# Patient Record
Sex: Male | Born: 1955 | Race: White | Hispanic: No | Marital: Single | Smoking: Never smoker
Health system: Southern US, Community
[De-identification: ages and names within clinical notes are randomized; demographics above are authoritative.]

## PROBLEM LIST (undated history)

## (undated) DIAGNOSIS — E785 Hyperlipidemia, unspecified: Secondary | ICD-10-CM

## (undated) DIAGNOSIS — B1910 Unspecified viral hepatitis B without hepatic coma: Secondary | ICD-10-CM

## (undated) DIAGNOSIS — G8929 Other chronic pain: Secondary | ICD-10-CM

## (undated) DIAGNOSIS — M1990 Unspecified osteoarthritis, unspecified site: Secondary | ICD-10-CM

## (undated) DIAGNOSIS — G629 Polyneuropathy, unspecified: Secondary | ICD-10-CM

## (undated) DIAGNOSIS — K760 Fatty (change of) liver, not elsewhere classified: Secondary | ICD-10-CM

## (undated) DIAGNOSIS — I10 Essential (primary) hypertension: Secondary | ICD-10-CM

## (undated) DIAGNOSIS — F419 Anxiety disorder, unspecified: Secondary | ICD-10-CM

## (undated) DIAGNOSIS — R42 Dizziness and giddiness: Secondary | ICD-10-CM

## (undated) DIAGNOSIS — R945 Abnormal results of liver function studies: Secondary | ICD-10-CM

## (undated) DIAGNOSIS — M5416 Radiculopathy, lumbar region: Secondary | ICD-10-CM

## (undated) DIAGNOSIS — E119 Type 2 diabetes mellitus without complications: Secondary | ICD-10-CM

## (undated) DIAGNOSIS — M545 Low back pain, unspecified: Secondary | ICD-10-CM

## (undated) DIAGNOSIS — R011 Cardiac murmur, unspecified: Secondary | ICD-10-CM

## (undated) DIAGNOSIS — F410 Panic disorder [episodic paroxysmal anxiety] without agoraphobia: Secondary | ICD-10-CM

## (undated) DIAGNOSIS — F32A Depression, unspecified: Secondary | ICD-10-CM

## (undated) DIAGNOSIS — R7989 Other specified abnormal findings of blood chemistry: Secondary | ICD-10-CM

## (undated) DIAGNOSIS — F329 Major depressive disorder, single episode, unspecified: Secondary | ICD-10-CM

## (undated) HISTORY — DX: Other specified abnormal findings of blood chemistry: R79.89

## (undated) HISTORY — DX: Unspecified viral hepatitis B without hepatic coma: B19.10

## (undated) HISTORY — DX: Type 2 diabetes mellitus without complications: E11.9

## (undated) HISTORY — DX: Hyperlipidemia, unspecified: E78.5

## (undated) HISTORY — DX: Panic disorder (episodic paroxysmal anxiety): F41.0

## (undated) HISTORY — DX: Dizziness and giddiness: R42

## (undated) HISTORY — DX: Cardiac murmur, unspecified: R01.1

## (undated) HISTORY — PX: PROSTATE BIOPSY: SHX241

## (undated) HISTORY — DX: Abnormal results of liver function studies: R94.5

## (undated) HISTORY — PX: COLONOSCOPY: SHX174

---

## 2014-08-16 LAB — LIPID PANEL
Cholesterol: 145 mg/dL (ref 0–200)
HDL: 36 mg/dL (ref 35–70)
LDL Cholesterol: 45 mg/dL
Triglycerides: 320 mg/dL — AB (ref 40–160)

## 2014-08-16 LAB — TSH: TSH: 1.61 u[IU]/mL (ref 0.41–5.90)

## 2014-08-16 LAB — HEPATIC FUNCTION PANEL
ALK PHOS: 81 U/L (ref 25–125)
ALT: 46 U/L — AB (ref 10–40)
AST: 29 U/L (ref 14–40)
BILIRUBIN DIRECT: 0.13 mg/dL (ref 0.01–0.4)
Bilirubin, Total: 0.4 mg/dL

## 2014-08-16 LAB — CBC AND DIFFERENTIAL
NEUTROS ABS: 5 /uL
WBC: 9.3 10*3/mL

## 2014-08-16 LAB — BASIC METABOLIC PANEL
BUN: 11 mg/dL (ref 4–21)
Creatinine: 0.8 mg/dL (ref 0.6–1.3)
GLUCOSE: 168 mg/dL
Sodium: 137 mmol/L (ref 137–147)

## 2014-08-16 LAB — HEMOGLOBIN A1C: Hemoglobin A1C: 6.1

## 2014-08-24 ENCOUNTER — Ambulatory Visit
Admit: 2014-08-24 | Disposition: A | Discharge status: Home / Self Care | Payer: Self-pay | Attending: Nurse Practitioner | Admitting: Nurse Practitioner

## 2014-09-08 ENCOUNTER — Ambulatory Visit: Payer: Self-pay

## 2014-09-08 DIAGNOSIS — R55 Syncope and collapse: Secondary | ICD-10-CM | POA: Insufficient documentation

## 2014-09-21 ENCOUNTER — Ambulatory Visit: Payer: Self-pay | Admitting: Internal Medicine

## 2014-09-22 ENCOUNTER — Ambulatory Visit: Payer: Self-pay

## 2014-10-04 ENCOUNTER — Encounter (INDEPENDENT_AMBULATORY_CARE_PROVIDER_SITE_OTHER): Payer: Self-pay

## 2014-10-18 ENCOUNTER — Other Ambulatory Visit: Payer: Self-pay

## 2014-10-18 ENCOUNTER — Encounter: Payer: Self-pay | Admitting: Emergency Medicine

## 2014-10-18 ENCOUNTER — Emergency Department
Admission: EM | Admit: 2014-10-18 | Discharge: 2014-10-18 | Disposition: A | Discharge status: Home / Self Care | Payer: Self-pay | Attending: Emergency Medicine | Admitting: Emergency Medicine

## 2014-10-18 DIAGNOSIS — R600 Localized edema: Secondary | ICD-10-CM | POA: Insufficient documentation

## 2014-10-18 DIAGNOSIS — Z79899 Other long term (current) drug therapy: Secondary | ICD-10-CM | POA: Insufficient documentation

## 2014-10-18 DIAGNOSIS — R609 Edema, unspecified: Secondary | ICD-10-CM

## 2014-10-18 DIAGNOSIS — I10 Essential (primary) hypertension: Secondary | ICD-10-CM | POA: Insufficient documentation

## 2014-10-18 HISTORY — DX: Major depressive disorder, single episode, unspecified: F32.9

## 2014-10-18 HISTORY — DX: Unspecified osteoarthritis, unspecified site: M19.90

## 2014-10-18 HISTORY — DX: Depression, unspecified: F32.A

## 2014-10-18 HISTORY — DX: Fatty (change of) liver, not elsewhere classified: K76.0

## 2014-10-18 HISTORY — DX: Anxiety disorder, unspecified: F41.9

## 2014-10-18 HISTORY — DX: Essential (primary) hypertension: I10

## 2014-10-18 LAB — BASIC METABOLIC PANEL
Anion gap: 8 (ref 5–15)
BUN: 14 mg/dL (ref 6–20)
CO2: 25 mmol/L (ref 22–32)
CREATININE: 0.69 mg/dL (ref 0.61–1.24)
Calcium: 9.1 mg/dL (ref 8.9–10.3)
Chloride: 105 mmol/L (ref 101–111)
GFR calc Af Amer: >60 mL/min (ref >60)
GFR calc non Af Amer: >60 mL/min (ref >60)
Glucose, Bld: 125 mg/dL — ABNORMAL HIGH (ref 65–99)
POTASSIUM: 3.7 mmol/L (ref 3.5–5.1)
SODIUM: 138 mmol/L (ref 135–145)

## 2014-10-18 LAB — CBC
HCT: 42.3 % (ref 40.0–52.0)
HEMOGLOBIN: 14.6 g/dL (ref 13.0–18.0)
MCH: 33.5 pg (ref 26.0–34.0)
MCHC: 34.5 g/dL (ref 32.0–36.0)
MCV: 97 fL (ref 80.0–100.0)
Platelets: 284 10*3/uL (ref 150–440)
RBC: 4.36 MIL/uL — ABNORMAL LOW (ref 4.40–5.90)
RDW: 12.3 % (ref 11.5–14.5)
WBC: 8.1 10*3/uL (ref 3.8–10.6)

## 2014-10-18 NOTE — ED Provider Notes (Signed)
[facility] Emergency Department Provider Note  ____________________________________________  Time seen: Approximately 12:10 PM  I have reviewed the triage vital signs and the nursing notes.   HISTORY  Chief Complaint Leg Swelling    HPI Kyle Hood is a 59 y.o. male with a history of hypertension, depression and anxiety, and peripheral edema for about 4-6 months, who presents with worsening swelling in his legs recently.He was prescribed 10 mg of furosemide daily as well as a potassium supplement for the last several months and also takes amlodipine, which she was told by his primary care doctor may cause some of the leg swelling.  It is not acutely or significantly worse recently, rather he feels that it has not gotten any better or has gotten a little bit worse over time.  He spends most of the day on his feet and has not been elevating his legs.  He has never tried compression stockings.  He has not been on any long trips recently, has no neoplastic history, no recent trauma or surgeries, and no prior history of blood clots.  The patient denies fever/chills, chest pain, shortness of breath, abdominal pain, nausea, vomiting.  He reports that he has to urinate several times during the night.  His swelling is worse at the end of the day than at the beginning.  Nothing that he has tried seems to make it better and being upright and on his feet makes it worse.  The swelling is accompanied with some pain and discomfort which he rates as mild to moderate.   Past Medical History  Diagnosis Date  . Arthritis   . Hypertension   . Depression   . Anxiety   . Fatty liver     There are no active problems to display for this patient.   Past Surgical History  Procedure Laterality Date  . Prostate biopsy      Current Outpatient Rx  Name  Route  Sig  Dispense  Refill  . amLODipine (NORVASC) 10 MG tablet   Oral   Take 10 mg by mouth daily.         . citalopram  (CELEXA) 10 MG tablet   Oral   Take 10 mg by mouth daily.         . furosemide (LASIX) 20 MG tablet   Oral   Take 10 mg by mouth daily.         . hydrOXYzine (VISTARIL) 50 MG capsule   Oral   Take 50 mg by mouth at bedtime as needed.         . potassium chloride (K-DUR) 10 MEQ tablet   Oral   Take 10 mEq by mouth daily.         . simvastatin (ZOCOR) 10 MG tablet   Oral   Take 10 mg by mouth daily.           Allergies Review of patient's allergies indicates no known allergies.  No family history on file.  Social History History  Substance Use Topics  . Smoking status: Never Smoker   . Smokeless tobacco: Not on file  . Alcohol Use: No    Review of Systems Constitutional: No fever/chills Eyes: No visual changes. ENT: No sore throat. Cardiovascular: Denies chest pain. Respiratory: Denies shortness of breath. Gastrointestinal: No abdominal pain.  No nausea, no vomiting.  No diarrhea.  No constipation. Genitourinary: Negative for dysuria. Musculoskeletal: Negative for back pain.  Swelling in lower extremities for several months Skin: Negative  for rash. Neurological: Negative for headaches, focal weakness or numbness.  10-point ROS otherwise negative.  ____________________________________________   PHYSICAL EXAM:  VITAL SIGNS: ED Triage Vitals  Enc Vitals Group     BP 10/18/14 1147 163/87 mmHg     Pulse Rate 10/18/14 1147 89     Resp 10/18/14 1147 20     Temp 10/18/14 1147 97.7 F (36.5 C)     Temp Source 10/18/14 1147 Oral     SpO2 10/18/14 1147 96 %     Weight 10/18/14 1147 256 lb (116.121 kg)     Height 10/18/14 1147  (1.88 m)     Head Cir --      Peak Flow --      Pain Score 10/18/14 1149 8     Pain Loc --      Pain Edu? --      Excl. in GC? --     Constitutional: Alert and oriented. Well appearing and in no acute distress. Eyes: Conjunctivae are normal. PERRL. EOMI. Head: Atraumatic. Nose: No  congestion/rhinnorhea. Mouth/Throat: Mucous membranes are moist.  Oropharynx non-erythematous. Neck: No stridor.   Cardiovascular: Normal rate, regular rhythm. Grossly normal heart sounds.  Good peripheral circulation. Respiratory: Normal respiratory effort.  No retractions. Lungs CTAB. Gastrointestinal: Soft and nontender. No distention. No abdominal bruits. No CVA tenderness. Musculoskeletal: Bilateral 1+ swelling/edema of lower extremities.  Some skin darkening on the anterior shins consistent with chronic edema.  No tenderness to palpation.  More swelling in left foot than the right, but both are roughly consistent.  Extremities are warm with normal cap refill.  No joint effusions. Neurologic:  Normal speech and language. No gross focal neurologic deficits are appreciated. Speech is normal. Skin:  Skin is warm, dry and intact. No rash noted. Psychiatric: Mood and affect are normal. Speech and behavior are normal.  ____________________________________________   LABS (all labs ordered are listed, but only abnormal results are displayed)  Labs Reviewed  CBC - Abnormal; Notable for the following:    RBC 4.36 (*)    All other components within normal limits  BASIC METABOLIC PANEL - Abnormal; Notable for the following:    Glucose, Bld 125 (*)    All other components within normal limits   ____________________________________________  EKG  ED ECG REPORT I, Yazan Gatling, the attending physician, personally viewed and interpreted this ECG.  Date: 10/18/2014 EKG Time: 12:04 Rate: 75 Rhythm: normal sinus rhythm QRS Axis: normal Intervals: normal ST/T Wave abnormalities: normal Conduction Disutrbances: none Narrative Interpretation: unremarkable  ____________________________________________  RADIOLOGY  Not indicated  ____________________________________________   PROCEDURES  Procedure(s) performed: None  Critical Care performed:  No ____________________________________________   INITIAL IMPRESSION / ASSESSMENT AND PLAN / ED COURSE  Pertinent labs & imaging results that were available during my care of the patient were reviewed by me and considered in my medical decision making (see chart for details).  The patient has chronic peripheral edema, which is evident not only by history but by the skin changes that are starting to develop due to the chronic nature of the issue.His well score for DVT is -1.  He has no signs or symptoms of infection to suggest that he has a bilateral lower extremity cellulitis.  I discussed his edema extensively with the patient and his partner.  His providers have suggested in the past that he use compression stockings and try elevating his extremities, but he has not done so yet.  I stressed the importance of these  measures as well as trying to maintain a low-sodium diet.  I explained that I felt it would be potentially more dangerous for me to change his medication regimen then for his primary care doctor to do so.  I am not concerned that the patient has an emergent medical condition at this time, and he and his partner are comfortable with our discussion, plan of care, and plan for follow-up.  I gave him my usual and customary return precautions.  ____________________________________________  FINAL CLINICAL IMPRESSION(S) / ED DIAGNOSES  Final diagnoses:  Peripheral edema      NEW MEDICATIONS STARTED DURING THIS VISIT:  Discharge Medication List as of 10/18/2014  1:09 PM       Loleta Rose, MD 10/18/14 2215

## 2014-10-18 NOTE — ED Notes (Signed)
Pt has been having increasing swelling in bilateral legs/feet for about 6 months now, [facility] about the swelling. Redness and warmpth noted to both shins as well, pt states his legs feel like they are on fire.

## 2014-10-18 NOTE — ED Notes (Signed)
Patient states he has had swelling since February. Swelling had worsened over last week. Left foot was very swollen and red and hot to touch. Some improvement today.

## 2014-10-18 NOTE — Discharge Instructions (Signed)
As we discussed, your labs today were reassuring, and we believe that conservative measures such as compression stockings and elevating your legs whenever possible, will be safer for you than changing your medications.  Please try using both of these methods to reduce the swelling and make sure that you are eating a diet is low in sodium is possible.  Please read through the included information for other advice and follow-up [facility] in about one week for a follow-up visit.  If he develop new or worsening symptoms that concern you, please return to the emergency department.   Peripheral Edema You have swelling in your legs (peripheral edema). This swelling is due to excess accumulation of salt and water in your body. Edema may be a sign of heart, kidney or liver disease, or a side effect of a medication. It may also be due to problems in the leg veins. Elevating your legs and using special support stockings may be very helpful, if the cause of the swelling is due to poor venous circulation. Avoid long periods of standing, whatever the cause. Treatment of edema depends on identifying the cause. Chips, pretzels, pickles and other salty foods should be avoided. Restricting salt in your diet is almost always needed. Water pills (diuretics) are often used to remove the excess salt and water from your body via urine. These medicines prevent the kidney from reabsorbing sodium. This increases urine flow. Diuretic treatment may also result in lowering of potassium levels in your body. Potassium supplements may be needed if you have to use diuretics daily. Daily weights can help you keep track of your progress in clearing your edema. You should call your caregiver for follow up care as recommended. SEEK IMMEDIATE MEDICAL CARE IF:   You have increased swelling, pain, redness, or heat in your legs.  You develop shortness of breath, especially when lying down.  You develop chest or abdominal  pain, weakness, or fainting.  You have a fever. Document Released: 05/23/2004 Document Revised: 07/08/2011 Document Reviewed: 05/03/2009 [facility] Patient Information 2015 [place], [place]. This information is not intended to replace advice given to you by your health care provider. Make sure you discuss any questions you have with your health care provider.

## 2014-10-26 ENCOUNTER — Ambulatory Visit: Payer: Self-pay | Admitting: Internal Medicine

## 2014-10-26 DIAGNOSIS — E785 Hyperlipidemia, unspecified: Secondary | ICD-10-CM | POA: Insufficient documentation

## 2014-10-26 DIAGNOSIS — R600 Localized edema: Secondary | ICD-10-CM | POA: Insufficient documentation

## 2014-10-26 DIAGNOSIS — I10 Essential (primary) hypertension: Secondary | ICD-10-CM | POA: Insufficient documentation

## 2014-11-23 ENCOUNTER — Other Ambulatory Visit: Payer: Self-pay

## 2014-11-30 ENCOUNTER — Ambulatory Visit: Payer: Self-pay | Admitting: Internal Medicine

## 2014-12-21 ENCOUNTER — Ambulatory Visit: Payer: Self-pay | Admitting: Internal Medicine

## 2015-01-11 ENCOUNTER — Ambulatory Visit: Payer: Self-pay | Admitting: Internal Medicine

## 2015-01-16 ENCOUNTER — Other Ambulatory Visit: Payer: Self-pay | Admitting: Internal Medicine

## 2015-01-16 DIAGNOSIS — R600 Localized edema: Secondary | ICD-10-CM

## 2015-01-16 DIAGNOSIS — M7989 Other specified soft tissue disorders: Secondary | ICD-10-CM

## 2015-01-20 ENCOUNTER — Ambulatory Visit
Admission: RE | Admit: 2015-01-20 | Discharge: 2015-01-20 | Disposition: A | Discharge status: Home / Self Care | Payer: Self-pay | Source: Ambulatory Visit | Attending: Internal Medicine | Admitting: Internal Medicine

## 2015-01-20 DIAGNOSIS — R600 Localized edema: Secondary | ICD-10-CM | POA: Insufficient documentation

## 2015-01-20 DIAGNOSIS — M7989 Other specified soft tissue disorders: Secondary | ICD-10-CM

## 2015-05-17 ENCOUNTER — Encounter (INDEPENDENT_AMBULATORY_CARE_PROVIDER_SITE_OTHER): Payer: Self-pay

## 2015-11-01 DIAGNOSIS — R600 Localized edema: Secondary | ICD-10-CM

## 2015-11-01 DIAGNOSIS — R55 Syncope and collapse: Secondary | ICD-10-CM

## 2015-11-01 DIAGNOSIS — I10 Essential (primary) hypertension: Secondary | ICD-10-CM

## 2015-11-01 DIAGNOSIS — E785 Hyperlipidemia, unspecified: Secondary | ICD-10-CM

## 2015-12-30 ENCOUNTER — Emergency Department: Payer: Medicaid Other

## 2015-12-30 ENCOUNTER — Encounter: Payer: Self-pay | Admitting: Emergency Medicine

## 2015-12-30 ENCOUNTER — Emergency Department
Admission: EM | Admit: 2015-12-30 | Discharge: 2015-12-30 | Disposition: A | Discharge status: Home / Self Care | Payer: Medicaid Other | Attending: Emergency Medicine | Admitting: Emergency Medicine

## 2015-12-30 DIAGNOSIS — I10 Essential (primary) hypertension: Secondary | ICD-10-CM | POA: Diagnosis not present

## 2015-12-30 DIAGNOSIS — R791 Abnormal coagulation profile: Secondary | ICD-10-CM | POA: Insufficient documentation

## 2015-12-30 DIAGNOSIS — R202 Paresthesia of skin: Secondary | ICD-10-CM | POA: Diagnosis present

## 2015-12-30 DIAGNOSIS — R200 Anesthesia of skin: Secondary | ICD-10-CM

## 2015-12-30 LAB — COMPREHENSIVE METABOLIC PANEL
ALBUMIN: 4.5 g/dL (ref 3.5–5.0)
ALK PHOS: 79 U/L (ref 38–126)
ALT: 36 U/L (ref 17–63)
AST: 28 U/L (ref 15–41)
Anion gap: 4 — ABNORMAL LOW (ref 5–15)
BILIRUBIN TOTAL: 0.8 mg/dL (ref 0.3–1.2)
BUN: 15 mg/dL (ref 6–20)
CALCIUM: 9.4 mg/dL (ref 8.9–10.3)
CO2: 27 mmol/L (ref 22–32)
CREATININE: 0.67 mg/dL (ref 0.61–1.24)
Chloride: 106 mmol/L (ref 101–111)
GFR calc Af Amer: >60 mL/min (ref >60)
GFR calc non Af Amer: >60 mL/min (ref >60)
GLUCOSE: 97 mg/dL (ref 65–99)
Potassium: 3.8 mmol/L (ref 3.5–5.1)
Sodium: 137 mmol/L (ref 135–145)
TOTAL PROTEIN: 7.4 g/dL (ref 6.5–8.1)

## 2015-12-30 LAB — PROTIME-INR
INR: 0.96
PROTHROMBIN TIME: 12.8 s (ref 11.4–15.2)

## 2015-12-30 LAB — TROPONIN I: Troponin I: <0.03 ng/mL (ref <0.03)

## 2015-12-30 LAB — CBC WITH DIFFERENTIAL/PLATELET
BASOS PCT: 0 %
Basophils Absolute: 0 10*3/uL (ref 0–0.1)
EOS ABS: 0.2 10*3/uL (ref 0–0.7)
EOS PCT: 2 %
HCT: 44.9 % (ref 40.0–52.0)
HEMOGLOBIN: 15.8 g/dL (ref 13.0–18.0)
LYMPHS ABS: 4.4 10*3/uL — AB (ref 1.0–3.6)
Lymphocytes Relative: 41 %
MCH: 33.9 pg (ref 26.0–34.0)
MCHC: 35.2 g/dL (ref 32.0–36.0)
MCV: 96.3 fL (ref 80.0–100.0)
MONO ABS: 0.8 10*3/uL (ref 0.2–1.0)
MONOS PCT: 7 %
NEUTROS PCT: 50 %
Neutro Abs: 5.3 10*3/uL (ref 1.4–6.5)
PLATELETS: 278 10*3/uL (ref 150–440)
RBC: 4.66 MIL/uL (ref 4.40–5.90)
RDW: 12.7 % (ref 11.5–14.5)
WBC: 10.7 10*3/uL — ABNORMAL HIGH (ref 3.8–10.6)

## 2015-12-30 LAB — LACTIC ACID, PLASMA: Lactic Acid, Venous: 1.5 mmol/L (ref 0.5–1.9)

## 2015-12-30 MED ORDER — GADOBENATE DIMEGLUMINE 529 MG/ML IV SOLN
20.0000 mL | Freq: Once | INTRAVENOUS | Status: AC | PRN
  Administered 2015-12-30: 20 mL via INTRAVENOUS

## 2015-12-30 MED ORDER — LORAZEPAM 1 MG PO TABS
1.0000 mg | ORAL_TABLET | Freq: Once | ORAL | Status: AC
  Administered 2015-12-30: 1 mg via ORAL
  Filled 2015-12-30: qty 1

## 2015-12-30 MED ORDER — ASPIRIN 81 MG PO CHEW
81.0000 mg | CHEWABLE_TABLET | Freq: Once | ORAL | Status: AC
  Administered 2015-12-30: 81 mg via ORAL
  Filled 2015-12-30: qty 1

## 2015-12-30 NOTE — ED Notes (Signed)
Pt taken to MRI at this time

## 2015-12-30 NOTE — ED Notes (Signed)
SOC on screen with patient.

## 2015-12-30 NOTE — Discharge Instructions (Signed)
From Dr. Shaune PollackLord: You were evaluated for atypical headache and facial numbness, although no certain cause was found, and your exam and evaluation are reassuring in the emergency department today.  As we discussed, you do need to follow up with a primary care physician this week, and if you're unable to get your appointment moved up, you may call the [facility] for an appointment this week. I'm also including the referral number for neurology, although this could be set up by the primary care physician as well.  Return to the emergency department for any worsening condition including any new or worsening numbness of other areas, weakness, slurred speech, vision changes, confusion or altered mental status, seizure, dizziness or passing out, or any other symptoms concerning to you.

## 2015-12-30 NOTE — ED Provider Notes (Signed)
[facility] Emergency Department Provider Note ____________________________________________   I have reviewed the triage vital signs and the triage nursing note.  HISTORY  Chief Complaint Numbness   Historian Patient  HPI Kyle Hood is a 60 y.o. male With a history of anxiety, depression and panic attacks, who has not recently been followed by primary care physician, but has an appointment for a new doctor at the end of the month, is here due to mild headache with whooshing sensation in his left ear, and numbness to the right side of his face. It sounds like symptoms have been ongoing now for probably about 4 days. He thinks it's waxing and waning, but never truly gone. This morning while he was doing the dishes he was feeling a little stressed and became lightheaded and dizzy and heard whooshing in his left ear and felt like the right side of his face was more numb than typical. For a few seconds yesterday when he was brushing his teeth he felt like he was having trouble moving his mouth normally.  Denies weakness of arms or the legs. Denies coordination problems. He does report that he has had a lot of stress. He doesn't typically have migraine headaches.    Past Medical History:  Diagnosis Date  . Anxiety   . Arthritis   . Depression   . Fatty liver   . Hepatitis B   . Hypertension   . Panic attacks     Patient Active Problem List   Diagnosis Date Noted  . Hypertension 10/26/2014  . Edema 10/26/2014  . Hyperlipidemia 10/26/2014  . Transient loss of consciousness 09/08/2014    Past Surgical History:  Procedure Laterality Date  . PROSTATE BIOPSY      Prior to Admission medications   Medication Sig Start Date End Date Taking? Authorizing Provider  buPROPion (WELLBUTRIN XL) 150 MG 24 hr tablet Take 150 mg by mouth every morning.   Yes Historical Provider, MD    Allergies  Allergen Reactions  . Lisinopril Cough    Family History  Problem  Relation Age of Onset  . Cancer Mother     Pancreatic  . Heart disease Father     CABG  . Cancer Father     Jaw    Social History Social History  Substance Use Topics  . Smoking status: Never Smoker  . Smokeless tobacco: Never Used  . Alcohol use Yes     Comment: Occasional wine     Review of Systems  Constitutional: Negative for fever. Eyes: Negative for visual changes. ENT: Negative for sore throat. Cardiovascular: Negative for chest pain. Respiratory: Negative for shortness of breath. Gastrointestinal: Negative for abdominal pain, vomiting and diarrhea. Genitourinary: Negative for dysuria. Musculoskeletal: Negative for back pain. Skin: Negative for rash. Neurological: he does not call it a headache, but more of a discomfort in the left side of the head. 10 point Review of Systems otherwise negative ____________________________________________   PHYSICAL EXAM:  VITAL SIGNS: ED Triage Vitals  Enc Vitals Group     BP 12/30/15 1154 (!) 171/91     Pulse Rate 12/30/15 1154 74     Resp 12/30/15 1154 (!) 29     Temp 12/30/15 1259 97.7 F (36.5 C)     Temp src --      SpO2 12/30/15 1154 100 %     Weight 12/30/15 1133 250 lb (113.4 kg)     Height 12/30/15 1133 5\' 11"  (1.803 m)  Head Circumference --      Peak Flow --      Pain Score 12/30/15 1133 8     Pain Loc --      Pain Edu? --      Excl. in GC? --      Constitutional: Alert and oriented. Well appearing and in no distress, butsomewhat anxious. HEENT   Head: Normocephalic and atraumatic.      Eyes: Conjunctivae are normal. PERRL. Normal extraocular movements.      Ears:         Nose: No congestion/rhinnorhea.   Mouth/Throat: Mucous membranes are moist.   Neck: No stridor. Cardiovascular/Chest: Normal rate, regular rhythm.  No murmurs, rubs, or gallops. Respiratory: Normal respiratory effort without tachypnea nor retractions. Breath sounds are clear and equal bilaterally. No  wheezes/rales/rhonchi. Gastrointestinal: Soft. No distention, no guarding, no rebound. Nontender.    Genitourinary/rectal:Deferred Musculoskeletal: Nontender with normal range of motion in all extremities. No joint effusions.  No lower extremity tenderness.  No edema. Neurologic:  Normal speech and language. no slurred speech. No expressive or receptive aphasia. Mild generalized weakness, but equal 4/5 strength proximal and distal muscles of the bilateral upper and lower x-rays. Paresthesia right face in V1, V2 and V3 distribution. Normal sensation in the arms and legs bilaterally. Finger-nose intact bilaterally.No pronator drift. Skin:  Skin is warm, dry and intact. No rash noted. Psychiatric: Mood and affect are normal. Somewhat anxious.Speech and behavior are normal. Patient exhibits appropriate insight and judgment.  ____________________________________________   EKG I, Governor Rooks, MD, the attending physician have personally viewed and interpreted all ECGs.  78 bpm. Normal sinus rhythm. Normal axis. Normal ST and T-wave. ____________________________________________  LABS (pertinent positives/negatives)  Labs Reviewed  COMPREHENSIVE METABOLIC PANEL - Abnormal; Notable for the following:       Result Value   Anion gap 4 (*)    All other components within normal limits  CBC WITH DIFFERENTIAL/PLATELET - Abnormal; Notable for the following:    WBC 10.7 (*)    Lymphs Abs 4.4 (*)    All other components within normal limits  TROPONIN I  PROTIME-INR  LACTIC ACID, PLASMA    ____________________________________________  RADIOLOGY All Xrays were viewed by me. Imaging interpreted by Radiologist.  CT head without contrast: No evidence for acute intracranial abnormality.  MRI brain, MA head and neck: Pending __________________________________________  PROCEDURES  Procedure(s) performed: None  Critical Care performed: CRITICAL CARE Performed by: Governor Rooks   Total  critical care time: 30 minutes  Critical care time was exclusive of separately billable procedures and treating other patients.  Critical care was necessary to treat or prevent imminent or life-threatening deterioration.  Critical care was time spent personally by me on the following activities: development of treatment plan with patient and/or surrogate as well as nursing, discussions with consultants, evaluation of patient's response to treatment, examination of patient, obtaining history from patient or surrogate, ordering and performing treatments and interventions, ordering and review of laboratory studies, ordering and review of radiographic studies, pulse oximetry and re-evaluation of patient's condition.   ____________________________________________   ED COURSE / ASSESSMENT AND PLAN  Pertinent labs & imaging results that were available during my care of the patient were reviewed by me and considered in my medical decision making (see chart for details).   Patient arrived and initiated as a code stroke by triage nurse. CT head read as negative.  On my examination, patient has paresthesias to the right side of his face and  no other focal neurologic deficit.  It sounds like time of onset is probably 4 days ago, although there is some waxing and waning component.  Symptoms are fairly atypical for stroke, however given the neurologic complaint, tele-neurology was consulted and evaluated the patient. My impression is consistent with the neurologist evaluation, feels TIA and stroke less likely then anxiety, or headache syndrome.  Patient's not really complaining of significant headache now and does not really request any medication for that. He is requesting anxiety medication prior to the MRI.  Patient care transferred to Dr. York CeriseForbach at shift change, 3:30 PM. MRI and MRA are pending.If no emergency findings, patient may be discharged with my prepared discharge  instructions.    CONSULTATIONS:  Tele-neurology, recommends MRI and if negative for stroke or other emergency condition, may discharge home for outpatient follow-up.   Patient / Family / Caregiver informed of clinical course, medical decision-making process, and agree with plan.   I discussed return precautions, follow-up instructions, and discharged instructions with patient and/or family.   ___________________________________________   FINAL CLINICAL IMPRESSION(S) / ED DIAGNOSES   Final diagnoses:  Right facial numbness              Note: This dictation was prepared with Dragon dictation. Any transcriptional errors that result from this process are unintentional    Governor Rooksebecca Eloyse Causey, MD 12/30/15 1521

## 2015-12-30 NOTE — ED Provider Notes (Signed)
----------------------------------------- 4:09 PM on 12/30/2015 -----------------------------------------   Blood pressure (!) 146/72, pulse 73, temperature 97.7 F (36.5 C), resp. rate 17, height 5\' 11"  (1.803 m), weight 113.4 kg, SpO2 97 %.  Assuming care from Dr. Shaune Pollack.  In short, Kyle Hood is a 60 y.o. male with a chief complaint of Numbness .  Refer to the original H&P for additional details.  The current plan of care is to follow-up MR imaging and reassess.  Anticipate outpatient follow-up.   ----------------------------------------- 6:32 PM on 12/30/2015 -----------------------------------------  Ct Head Wo Contrast  Result Date: 12/30/2015 CLINICAL DATA:  Code stroke. Dr. Fanny Bien @ [phone]. Pt reports after eating breakfast at 9 am he started to feel numbness in right side of face and headache on right side. States he then had trouble concentrating. EXAM: CT HEAD WITHOUT CONTRAST TECHNIQUE: Contiguous axial images were obtained from the base of the skull through the vertex without intravenous contrast. COMPARISON:  None. FINDINGS: Brain: There is no intra or extra-axial fluid collection or mass lesion. The basilar cisterns and ventricles have a normal appearance. There is no CT evidence for acute infarction or hemorrhage. Vascular: Mild atherosclerotic calcification of the internal carotid arteries. Skull: Unremarkable. Sinuses/Orbits: Unremarkable. Other: None IMPRESSION: No evidence for acute intracranial abnormality. Electronically Signed   By: Norva Pavlov M.D.   On: 12/30/2015 11:53   Mr Maxine Glenn Head Wo Contrast  Result Date: 12/30/2015 CLINICAL DATA:  Headache with whooshing sensation in the left ear and right-sided facial numbness. Symptoms for 4 days, waxing and waning. EXAM: MRI HEAD WITHOUT CONTRAST MRA HEAD WITHOUT CONTRAST MRA NECK WITHOUT AND WITH CONTRAST TECHNIQUE: Multiplanar, multiecho pulse sequences of the brain and surrounding structures were obtained without  intravenous contrast. Angiographic images of the Circle of Willis were obtained using MRA technique without intravenous contrast. Angiographic images of the neck were obtained using MRA technique without and with intravenous contrast. Carotid stenosis measurements (when applicable) are obtained utilizing NASCET criteria, using the distal internal carotid diameter as the denominator. CONTRAST:  20mL MULTIHANCE GADOBENATE DIMEGLUMINE 529 MG/ML IV SOLN COMPARISON:  Head CT 12/30/2015 FINDINGS: MRI HEAD FINDINGS There is no evidence of acute infarct, intracranial hemorrhage, mass, midline shift, or extra-axial fluid collection. The ventricles and sulci are normal. The brain is normal in signal. Orbits are unremarkable. There is trace right maxillary sinus mucosal thickening. The mastoid air cells are clear. Limited assessment of the seventh and eighth cranial nerve complexes on this nondedicated study is unremarkable. Major intracranial vascular flow voids are preserved, with the left vertebral artery being dominant. No focal osseous lesion is identified. MRA HEAD FINDINGS The visualized distal left vertebral artery is widely patent and dominant, supplying the basilar. The distal right vertebral artery is hypoplastic. Right PICA appears patent, although its origin was not imaged. Left PICA origin is patent. SCA origins are patent. Basilar artery is widely patent and mildly tortuous. There is likely a tiny left posterior communicating artery. PCAs are patent without evidence of significant proximal stenosis or major branch occlusion. The internal carotid arteries are patent from skullbase to carotid termini and follow a normal course without stenosis. ACAs and MCAs are patent without evidence of major branch occlusion or significant proximal stenosis. No intracranial aneurysm is identified. MRA NECK FINDINGS Normal variant aortic arch branching pattern with average right subclavian artery. Both subclavian arteries are  patent without significant stenosis. The cervical carotid arteries are widely patent without evidence of stenosis or dissection. The vertebral arteries are patent with antegrade flow  bilaterally. The left vertebral artery is dominant and without stenosis. The proximal aspect of the right vertebral artery is not well evaluated due to motion. The remainder of the right vertebral artery is patent without stenosis. IMPRESSION: 1. Unremarkable appearance of the brain. 2. Negative head MRA. 3. Widely patent cervical carotid and left vertebral arteries. Proximal right vertebral artery not well evaluated. Electronically Signed   By: Sebastian AcheAllen  Grady M.D.   On: 12/30/2015 17:53   Mr Angiogram Neck W Wo Contrast  Result Date: 12/30/2015 CLINICAL DATA:  Headache with whooshing sensation in the left ear and right-sided facial numbness. Symptoms for 4 days, waxing and waning. EXAM: MRI HEAD WITHOUT CONTRAST MRA HEAD WITHOUT CONTRAST MRA NECK WITHOUT AND WITH CONTRAST TECHNIQUE: Multiplanar, multiecho pulse sequences of the brain and surrounding structures were obtained without intravenous contrast. Angiographic images of the Circle of Willis were obtained using MRA technique without intravenous contrast. Angiographic images of the neck were obtained using MRA technique without and with intravenous contrast. Carotid stenosis measurements (when applicable) are obtained utilizing NASCET criteria, using the distal internal carotid diameter as the denominator. CONTRAST:  20mL MULTIHANCE GADOBENATE DIMEGLUMINE 529 MG/ML IV SOLN COMPARISON:  Head CT 12/30/2015 FINDINGS: MRI HEAD FINDINGS There is no evidence of acute infarct, intracranial hemorrhage, mass, midline shift, or extra-axial fluid collection. The ventricles and sulci are normal. The brain is normal in signal. Orbits are unremarkable. There is trace right maxillary sinus mucosal thickening. The mastoid air cells are clear. Limited assessment of the seventh and eighth cranial  nerve complexes on this nondedicated study is unremarkable. Major intracranial vascular flow voids are preserved, with the left vertebral artery being dominant. No focal osseous lesion is identified. MRA HEAD FINDINGS The visualized distal left vertebral artery is widely patent and dominant, supplying the basilar. The distal right vertebral artery is hypoplastic. Right PICA appears patent, although its origin was not imaged. Left PICA origin is patent. SCA origins are patent. Basilar artery is widely patent and mildly tortuous. There is likely a tiny left posterior communicating artery. PCAs are patent without evidence of significant proximal stenosis or major branch occlusion. The internal carotid arteries are patent from skullbase to carotid termini and follow a normal course without stenosis. ACAs and MCAs are patent without evidence of major branch occlusion or significant proximal stenosis. No intracranial aneurysm is identified. MRA NECK FINDINGS Normal variant aortic arch branching pattern with average right subclavian artery. Both subclavian arteries are patent without significant stenosis. The cervical carotid arteries are widely patent without evidence of stenosis or dissection. The vertebral arteries are patent with antegrade flow bilaterally. The left vertebral artery is dominant and without stenosis. The proximal aspect of the right vertebral artery is not well evaluated due to motion. The remainder of the right vertebral artery is patent without stenosis. IMPRESSION: 1. Unremarkable appearance of the brain. 2. Negative head MRA. 3. Widely patent cervical carotid and left vertebral arteries. Proximal right vertebral artery not well evaluated. Electronically Signed   By: Sebastian AcheAllen  Grady M.D.   On: 12/30/2015 17:53   Mr Brain Wo Contrast  Result Date: 12/30/2015 CLINICAL DATA:  Headache with whooshing sensation in the left ear and right-sided facial numbness. Symptoms for 4 days, waxing and waning. EXAM:  MRI HEAD WITHOUT CONTRAST MRA HEAD WITHOUT CONTRAST MRA NECK WITHOUT AND WITH CONTRAST TECHNIQUE: Multiplanar, multiecho pulse sequences of the brain and surrounding structures were obtained without intravenous contrast. Angiographic images of the Circle of Willis were obtained using MRA technique without  intravenous contrast. Angiographic images of the neck were obtained using MRA technique without and with intravenous contrast. Carotid stenosis measurements (when applicable) are obtained utilizing NASCET criteria, using the distal internal carotid diameter as the denominator. CONTRAST:  20mL MULTIHANCE GADOBENATE DIMEGLUMINE 529 MG/ML IV SOLN COMPARISON:  Head CT 12/30/2015 FINDINGS: MRI HEAD FINDINGS There is no evidence of acute infarct, intracranial hemorrhage, mass, midline shift, or extra-axial fluid collection. The ventricles and sulci are normal. The brain is normal in signal. Orbits are unremarkable. There is trace right maxillary sinus mucosal thickening. The mastoid air cells are clear. Limited assessment of the seventh and eighth cranial nerve complexes on this nondedicated study is unremarkable. Major intracranial vascular flow voids are preserved, with the left vertebral artery being dominant. No focal osseous lesion is identified. MRA HEAD FINDINGS The visualized distal left vertebral artery is widely patent and dominant, supplying the basilar. The distal right vertebral artery is hypoplastic. Right PICA appears patent, although its origin was not imaged. Left PICA origin is patent. SCA origins are patent. Basilar artery is widely patent and mildly tortuous. There is likely a tiny left posterior communicating artery. PCAs are patent without evidence of significant proximal stenosis or major branch occlusion. The internal carotid arteries are patent from skullbase to carotid termini and follow a normal course without stenosis. ACAs and MCAs are patent without evidence of major branch occlusion or  significant proximal stenosis. No intracranial aneurysm is identified. MRA NECK FINDINGS Normal variant aortic arch branching pattern with average right subclavian artery. Both subclavian arteries are patent without significant stenosis. The cervical carotid arteries are widely patent without evidence of stenosis or dissection. The vertebral arteries are patent with antegrade flow bilaterally. The left vertebral artery is dominant and without stenosis. The proximal aspect of the right vertebral artery is not well evaluated due to motion. The remainder of the right vertebral artery is patent without stenosis. IMPRESSION: 1. Unremarkable appearance of the brain. 2. Negative head MRA. 3. Widely patent cervical carotid and left vertebral arteries. Proximal right vertebral artery not well evaluated. Electronically Signed   By: Sebastian Ache M.D.   On: 12/30/2015 17:53     Reassuring MRI.  Patient in no acute distress with no new or worsening symptoms at this time.  I advised him and his partner of the results and they are both reassured.  I will discharge them as per Dr. Merlene Pulling discharge instructions.   Loleta Rose, MD 12/30/15 [phone]

## 2015-12-30 NOTE — ED Notes (Signed)
NAD noted at this time. Pt resting in bed with his friend at bedside. Will continue to monitor for further patient needs. Explained to patient that MD had written orders for medications prior to MRI and per MRI tech he would be going at approx 1545. Pt states understanding of delay, will continue to monitor.

## 2015-12-30 NOTE — ED Notes (Signed)
NAD noted at time of D/C. Pt denies questions or concerns. Pt taken to the lobby via wheelchair at this time by his significant other.

## 2015-12-30 NOTE — ED Triage Notes (Signed)
Pt reports after eating breakfast at 9 am he started to feel numbness in right side of face and headache on right side.  States he then had trouble concentrating.  Grips are equal, no facial droop noted, no slurred speech.

## 2015-12-30 NOTE — ED Notes (Signed)
Pt returned from MRI at this time

## 2015-12-30 NOTE — ED Notes (Signed)
NAD noted at this time. Spoke with patient regarding MRI, pt states some nervousness about being in MRI. This RN told patient that I would speak with MD regarding possible medications prior to going to MRI. Pt states understanding about the wait to go to MRI, and the need for the procedure. Pt's friend remains at bedside. Denies any needs. Will continue to monitor for further patient needs.

## 2016-03-25 ENCOUNTER — Encounter (INDEPENDENT_AMBULATORY_CARE_PROVIDER_SITE_OTHER): Payer: Self-pay

## 2016-03-25 ENCOUNTER — Ambulatory Visit (INDEPENDENT_AMBULATORY_CARE_PROVIDER_SITE_OTHER): Payer: Medicaid Other | Admitting: Vascular Surgery

## 2016-03-25 ENCOUNTER — Encounter (INDEPENDENT_AMBULATORY_CARE_PROVIDER_SITE_OTHER): Payer: Self-pay | Admitting: Vascular Surgery

## 2016-03-25 VITALS — BP 166/88 | HR 82 | Resp 16 | Ht 72.0 in | Wt 260.0 lb

## 2016-03-25 DIAGNOSIS — E785 Hyperlipidemia, unspecified: Secondary | ICD-10-CM

## 2016-03-25 DIAGNOSIS — R600 Localized edema: Secondary | ICD-10-CM | POA: Diagnosis not present

## 2016-03-25 DIAGNOSIS — I10 Essential (primary) hypertension: Secondary | ICD-10-CM

## 2016-03-25 DIAGNOSIS — I739 Peripheral vascular disease, unspecified: Secondary | ICD-10-CM | POA: Insufficient documentation

## 2016-03-25 NOTE — Progress Notes (Signed)
Subjective:    Patient ID: Kyle Hood, male    DOB: [birth year], 60 y.o.   MRN: [id number] Chief Complaint  Patient presents with  . New Patient (Initial Visit)   Presents as a new patient referred by Dr. Ephraim Hamburger for "left lower extremity edema". The patient endorses history of lower extremity "swelling" left worse than right. "Swelling" started approximately two year ago. He denies any trauma or surgery to his bilateral lower extremities. States his left extremity was so swollen at one point, he could not wear regular shoes. He was recently started on HCTZ and prescribed compression stockings by his PCP. Patient states this has helped "dramatically" however he still does suffer from swelling in both his extremities. The swelling is associated with discomfort. Describes the discomfort as a "throbbing" or "burning" feeling. He also describes some intermittent mild pain in his bilateral calves when walking long distances. He denies a history of any wound or ulcer formation.     Review of Systems  Constitutional: Negative.   HENT: Negative.   Eyes: Negative.   Respiratory: Negative.   Cardiovascular: Positive for leg swelling (Bilaterally).       Bilateral Lower Extremity Pain  Gastrointestinal: Negative.   Endocrine: Negative.   Genitourinary: Negative.   Musculoskeletal: Negative.   Skin: Negative.  Color change: Bilateral Shins.  Allergic/Immunologic: Negative.   Neurological: Negative.   Hematological: Negative.   Psychiatric/Behavioral: Negative.        Objective:   Physical Exam  Constitutional: He is oriented to person, place, and time. He appears well-developed and well-nourished.  HENT:  Head: Normocephalic and atraumatic.  Right Ear: External ear normal.  Left Ear: External ear normal.  Eyes: Conjunctivae and EOM are normal. Pupils are equal, round, and reactive to light.  Neck: Normal range of motion.  Cardiovascular: Normal rate, regular rhythm, normal heart  sounds and intact distal pulses.   Pulses:      Radial pulses are 2+ on the right side, and 2+ on the left side.       Dorsalis pedis pulses are 1+ on the right side, and 1+ on the left side.       Posterior tibial pulses are 1+ on the right side, and 1+ on the left side.  Pulmonary/Chest: Effort normal and breath sounds normal.  Abdominal: Soft. Bowel sounds are normal.  Musculoskeletal: Normal range of motion. He exhibits edema (Right: Mild, 1+ Pitting Edema / Left: Moderate, 1+ Pitting Edema).  Neurological: He is alert and oriented to person, place, and time.  Skin: Skin is warm and dry. No rash noted. No erythema. No pallor.  Mild bilateral stasis dermatitis  Psychiatric: He has a normal mood and affect. His behavior is normal. Judgment and thought content normal.   BP (!) 166/88   Pulse 82   Resp 16   Ht 6' (1.829 m)   Wt 260 lb (117.9 kg)   BMI 35.26 kg/m   Past Medical History:  Diagnosis Date  . Anxiety   . Arthritis   . Depression   . Fatty liver   . Hepatitis B   . Hypertension   . Panic attacks    Social History   Social History  . Marital status: Single    Spouse name: N/A  . Number of children: N/A  . Years of education: N/A   Occupational History  . Not on file.   Social History Main Topics  . Smoking status: Never Smoker  . Smokeless tobacco: Never Used  .  Alcohol use Yes     Comment: Occasional wine   . Drug use: No  . Sexual activity: Not on file   Other Topics Concern  . Not on file   Social History Narrative  . No narrative on file   Past Surgical History:  Procedure Laterality Date  . PROSTATE BIOPSY     Family History  Problem Relation Age of Onset  . Cancer Mother     Pancreatic  . Diabetes Mother   . Hypertension Mother   . Varicose Veins Mother   . Heart disease Father     CABG  . Cancer Father     Jaw  . Aortic aneurysm Father   . Congestive Heart Failure Father   . Hypertension Father   . Varicose Veins Father     Allergies  Allergen Reactions  . Lisinopril Cough      Assessment & Plan:  Presents as a new patient referred by Dr. Ephraim Hamburger for "left lower extremity edema". The patient endorses history of lower extremity "swelling" left worse than right. "Swelling" started approximately two year ago. He denies any trauma or surgery to his bilateral lower extremities. States his left extremity was so swollen at one point, he could not wear regular shoes. He was recently started on HCTZ and prescribed compression stockings by his PCP. Patient states this has helped "dramatically" however he still does suffer from swelling in both his extremities. The swelling is associated with discomfort. Describes the discomfort as a "throbbing" or "burning" feeling. He also describes some intermittent mild pain in his bilateral calves when walking long distances. He denies a history of any wound or ulcer formation.   1. Claudication of both lower extremities (HCC) - New Patient with risk factors for PAD (HTN & Hyperlipidemia). Mild claudication pain.  - VAS [place] ABI WITH/WO TBI; Future  2. Lower extremity edema - New The patient was encouraged to continue wearing graduated compression stockings (20-30 mmHg) on a daily basis. The patient was instructed to begin wearing the stockings first thing in the morning and removing them in the evening. The patient was instructed specifically not to sleep in the stockings. In addition, behavioral modification including elevation during the day will be initiated. Anti-inflammatories for pain. - VAS [place] LOWER EXTREMITY VENOUS REFLUX; Future  3. Hyperlipidemia, unspecified hyperlipidemia type - Stable Encouraged good control as its slows the progression of atherosclerotic disease  4. Essential hypertension - Stable Encouraged good control as its slows the progression of atherosclerotic disease  Current Outpatient Prescriptions on File Prior to Visit  Medication Sig Dispense Refill  .  buPROPion (WELLBUTRIN XL) 150 MG 24 hr tablet Take 150 mg by mouth every morning.     No current facility-administered medications on file prior to visit.     There are no Patient Instructions on file for this visit. No Follow-up on file.   Javonnie Illescas A Olof Marcil, PA-C

## 2016-06-14 ENCOUNTER — Encounter: Payer: Self-pay | Admitting: Emergency Medicine

## 2016-06-14 ENCOUNTER — Emergency Department
Admission: EM | Admit: 2016-06-14 | Discharge: 2016-06-14 | Disposition: A | Discharge status: Home / Self Care | Payer: Medicaid Other | Attending: Emergency Medicine | Admitting: Emergency Medicine

## 2016-06-14 ENCOUNTER — Emergency Department: Payer: Medicaid Other

## 2016-06-14 DIAGNOSIS — M79605 Pain in left leg: Secondary | ICD-10-CM | POA: Diagnosis present

## 2016-06-14 DIAGNOSIS — L03116 Cellulitis of left lower limb: Secondary | ICD-10-CM | POA: Diagnosis not present

## 2016-06-14 DIAGNOSIS — I10 Essential (primary) hypertension: Secondary | ICD-10-CM | POA: Insufficient documentation

## 2016-06-14 LAB — BLOOD CULTURE ID PANEL (REFLEXED)
ACINETOBACTER BAUMANNII: NOT DETECTED
CANDIDA ALBICANS: NOT DETECTED
CANDIDA GLABRATA: NOT DETECTED
Candida krusei: NOT DETECTED
Candida parapsilosis: NOT DETECTED
Candida tropicalis: NOT DETECTED
ENTEROBACTER CLOACAE COMPLEX: NOT DETECTED
ENTEROBACTERIACEAE SPECIES: NOT DETECTED
ENTEROCOCCUS SPECIES: NOT DETECTED
Escherichia coli: NOT DETECTED
HAEMOPHILUS INFLUENZAE: NOT DETECTED
Klebsiella oxytoca: NOT DETECTED
Klebsiella pneumoniae: NOT DETECTED
Listeria monocytogenes: NOT DETECTED
NEISSERIA MENINGITIDIS: NOT DETECTED
PSEUDOMONAS AERUGINOSA: NOT DETECTED
Proteus species: NOT DETECTED
STREPTOCOCCUS AGALACTIAE: DETECTED — AB
STREPTOCOCCUS PNEUMONIAE: NOT DETECTED
STREPTOCOCCUS SPECIES: DETECTED — AB
Serratia marcescens: NOT DETECTED
Staphylococcus aureus (BCID): NOT DETECTED
Staphylococcus species: NOT DETECTED
Streptococcus pyogenes: NOT DETECTED

## 2016-06-14 LAB — CBC
HEMATOCRIT: 45.6 % (ref 40.0–52.0)
Hemoglobin: 15.5 g/dL (ref 13.0–18.0)
MCH: 32.6 pg (ref 26.0–34.0)
MCHC: 34 g/dL (ref 32.0–36.0)
MCV: 96 fL (ref 80.0–100.0)
PLATELETS: 285 10*3/uL (ref 150–440)
RBC: 4.76 MIL/uL (ref 4.40–5.90)
RDW: 12.6 % (ref 11.5–14.5)
WBC: 18.9 10*3/uL — AB (ref 3.8–10.6)

## 2016-06-14 LAB — COMPREHENSIVE METABOLIC PANEL
ALBUMIN: 4.6 g/dL (ref 3.5–5.0)
ALT: 33 U/L (ref 17–63)
ANION GAP: 11 (ref 5–15)
AST: 37 U/L (ref 15–41)
Alkaline Phosphatase: 77 U/L (ref 38–126)
BILIRUBIN TOTAL: 0.8 mg/dL (ref 0.3–1.2)
BUN: 15 mg/dL (ref 6–20)
CHLORIDE: 100 mmol/L — AB (ref 101–111)
CO2: 26 mmol/L (ref 22–32)
Calcium: 9.2 mg/dL (ref 8.9–10.3)
Creatinine, Ser: 1.04 mg/dL (ref 0.61–1.24)
GFR calc Af Amer: >60 mL/min (ref >60)
GFR calc non Af Amer: >60 mL/min (ref >60)
GLUCOSE: 140 mg/dL — AB (ref 65–99)
POTASSIUM: 3.8 mmol/L (ref 3.5–5.1)
SODIUM: 137 mmol/L (ref 135–145)
Total Protein: 7.8 g/dL (ref 6.5–8.1)

## 2016-06-14 LAB — INFLUENZA PANEL BY PCR (TYPE A & B)
INFLAPCR: NEGATIVE
Influenza B By PCR: NEGATIVE

## 2016-06-14 MED ORDER — CLINDAMYCIN PHOSPHATE 900 MG/50ML IV SOLN
900.0000 mg | Freq: Once | INTRAVENOUS | Status: AC
  Administered 2016-06-14: 900 mg via INTRAVENOUS
  Filled 2016-06-14: qty 50

## 2016-06-14 MED ORDER — MORPHINE SULFATE (PF) 2 MG/ML IV SOLN
2.0000 mg | Freq: Once | INTRAVENOUS | Status: AC
  Administered 2016-06-14: 2 mg via INTRAVENOUS
  Filled 2016-06-14: qty 1

## 2016-06-14 MED ORDER — ONDANSETRON HCL 4 MG/2ML IJ SOLN
4.0000 mg | Freq: Once | INTRAMUSCULAR | Status: AC
  Administered 2016-06-14: 4 mg via INTRAVENOUS
  Filled 2016-06-14: qty 2

## 2016-06-14 MED ORDER — OXYCODONE-ACETAMINOPHEN 5-325 MG PO TABS: 1.0000 | ORAL_TABLET | ORAL | 0 refills | Status: DC | PRN

## 2016-06-14 MED ORDER — SULFAMETHOXAZOLE-TRIMETHOPRIM 800-160 MG PO TABS: 1.0000 | ORAL_TABLET | Freq: Two times a day (BID) | ORAL | 0 refills | Status: DC

## 2016-06-14 MED ORDER — SODIUM CHLORIDE 0.9 % IV BOLUS (SEPSIS)
1000.0000 mL | Freq: Once | INTRAVENOUS | Status: AC
  Administered 2016-06-14: 1000 mL via INTRAVENOUS

## 2016-06-14 NOTE — ED Notes (Signed)
Patient transported to Ultrasound 

## 2016-06-14 NOTE — ED Notes (Signed)
Patient c/o redness/pain/warmth to lower left leg. Pt c/o pain to left groin.

## 2016-06-14 NOTE — ED Notes (Signed)
MD Brown at bedside.

## 2016-06-14 NOTE — ED Provider Notes (Signed)
[facility] Emergency Department Provider Note    First MD Initiated Contact with Patient 06/14/16 [phone]     (approximate)  I have reviewed the triage vital signs and the nursing notes.   HISTORY  Chief Complaint Cellulitis   HPI Kyle Hood is a 61 y.o. male with bolus of chronic medical conditions presents the emergency department with left leg pain, redness and warmth. Patient states that he was treated for cellulitis in November however never completely resolved. Patient also admits to generalized pain. Patient exhibits a subjective fevers at home temperature 99.4 on arrival to the emergency department. Patient however noted to be tachycardic with a heart rate of 123    Past Medical History:  Diagnosis Date  . Anxiety   . Arthritis   . Depression   . Fatty liver   . Hepatitis B   . Hypertension   . Panic attacks     Patient Active Problem List   Diagnosis Date Noted  . Claudication of both lower extremities (HCC) 03/25/2016  . Hypertension 10/26/2014  . Lower extremity edema 10/26/2014  . Hyperlipidemia 10/26/2014  . Transient loss of consciousness 09/08/2014    Past Surgical History:  Procedure Laterality Date  . PROSTATE BIOPSY      Prior to Admission medications   Medication Sig Start Date End Date Taking? Authorizing Provider  buPROPion (WELLBUTRIN XL) 150 MG 24 hr tablet Take 150 mg by mouth every morning.    Historical Provider, MD  hydrochlorothiazide (HYDRODIURIL) 25 MG tablet Take 25 mg by mouth daily.    Historical Provider, MD  oxyCODONE-acetaminophen (ROXICET) 5-325 MG tablet Take 1 tablet by mouth every 4 (four) hours as needed for severe pain. 06/14/16   Charlynne Pander, MD  sulfamethoxazole-trimethoprim (BACTRIM DS,SEPTRA DS) 800-160 MG tablet Take 1 tablet by mouth 2 (two) times daily. 06/14/16 06/24/16  Darci Current, MD    Allergies Lisinopril  Family History  Problem Relation Age of Onset  . Cancer Mother       Pancreatic  . Diabetes Mother   . Hypertension Mother   . Varicose Veins Mother   . Heart disease Father     CABG  . Cancer Father     Jaw  . Aortic aneurysm Father   . Congestive Heart Failure Father   . Hypertension Father   . Varicose Veins Father     Social History Social History  Substance Use Topics  . Smoking status: Never Smoker  . Smokeless tobacco: Never Used  . Alcohol use Yes     Comment: Occasional wine     Review of Systems Constitutional: Positive for fever/chills Eyes: No visual changes. ENT: No sore throat. Cardiovascular: Denies chest pain. Respiratory: Denies shortness of breath. Gastrointestinal: No abdominal pain.  No nausea, no vomiting.  No diarrhea.  No constipation. Genitourinary: Negative for dysuria. Musculoskeletal: Negative for back pain. Skin: Positive for left leg redness and tenderness Neurological: Negative for headaches, focal weakness or numbness.  10-point ROS otherwise negative.  ____________________________________________   PHYSICAL EXAM:  VITAL SIGNS: ED Triage Vitals  Enc Vitals Group     BP 06/14/16 0448 (!) 177/78     Pulse Rate 06/14/16 0448 (!) 123     Resp 06/14/16 0448 20     Temp 06/14/16 0448 99.4 F (37.4 C)     Temp Source 06/14/16 0448 Oral     SpO2 06/14/16 0448 97 %     Weight 06/14/16 0448 246 lb (111.6 kg)  Height 06/14/16 0448 6' (1.829 m)     Head Circumference --      Peak Flow --      Pain Score 06/14/16 0449 10     Pain Loc --      Pain Edu? --      Excl. in GC? --     Constitutional: Alert and oriented. Well appearing and in no acute distress. Eyes: Conjunctivae are normal. PERRL. EOMI. Head: Atraumatic. Mouth/Throat: Mucous membranes are moist. Oropharynx non-erythematous. Neck: No stridor.  Cardiovascular: Normal rate, regular rhythm. Good peripheral circulation. Grossly normal heart sounds. Respiratory: Normal respiratory effort.  No retractions. Lungs CTAB. Gastrointestinal:  Generalized tenderness with very mild palpation. No distention.  Musculoskeletal: No lower extremity tenderness nor edema. No gross deformities of extremities. Neurologic:  Normal speech and language. No gross focal neurologic deficits are appreciated.  Skin:  5 x 3 area of blanching erythema left lower leg consistent with cellulitis. Warm to the touch Psychiatric: Mood and affect are normal. Speech and behavior are normal.  ____________________________________________   LABS (all labs ordered are listed, but only abnormal results are displayed)  Labs Reviewed  BLOOD CULTURE ID PANEL (REFLEXED) - Abnormal; Notable for the following:       Result Value   Streptococcus species DETECTED (*)    Streptococcus agalactiae DETECTED (*)    All other components within normal limits  CBC - Abnormal; Notable for the following:    WBC 18.9 (*)    All other components within normal limits  COMPREHENSIVE METABOLIC PANEL - Abnormal; Notable for the following:    Chloride 100 (*)    Glucose, Bld 140 (*)    All other components within normal limits  CULTURE, BLOOD (SINGLE)  INFLUENZA PANEL BY PCR (TYPE A & B)   ___________________________ RADIOLOGY I, Hall N BROWN, personally viewed and evaluated these images (plain radiographs) as part of my medical decision making, as well as reviewing the written report by the radiologist.  CLINICAL DATA:  Pain and swelling, cellulitis for several months.  EXAM: LEFT LOWER EXTREMITY VENOUS DOPPLER ULTRASOUND  TECHNIQUE: Gray-scale sonography with graded compression, as well as color Doppler and duplex ultrasound were performed to evaluate the lower extremity deep venous systems from the level of the common femoral vein and including the common femoral, femoral, profunda femoral, popliteal and calf veins including the posterior tibial, peroneal and gastrocnemius veins when visible. The superficial great saphenous vein was also interrogated. Spectral  Doppler was utilized to evaluate flow at rest and with distal augmentation maneuvers in the common femoral, femoral and popliteal veins.  COMPARISON:  None.  FINDINGS: Contralateral Common Femoral Vein: Respiratory phasicity is normal and symmetric with the symptomatic side. No evidence of thrombus. Normal compressibility.  Common Femoral Vein: No evidence of thrombus. Normal compressibility, respiratory phasicity and response to augmentation.  Saphenofemoral Junction: No evidence of thrombus. Normal compressibility and flow on color Doppler imaging.  Profunda Femoral Vein: No evidence of thrombus. Normal compressibility and flow on color Doppler imaging.  Femoral Vein: No evidence of thrombus. Normal compressibility, respiratory phasicity and response to augmentation.  Popliteal Vein: No evidence of thrombus. Normal compressibility, respiratory phasicity and response to augmentation.  Calf Veins: No evidence of thrombus. Normal compressibility and flow on color Doppler imaging.  Superficial Great Saphenous Vein: No evidence of thrombus. Normal compressibility and flow on color Doppler imaging.  Venous Reflux:  None.  Other Findings:  None.  IMPRESSION: No evidence of LEFT lower extremity deep venous thrombosis.  Electronically Signed   By: Awilda Metroourtnay  Bloomer M.D.   On: 06/14/2016 06:27    Procedures     INITIAL IMPRESSION / ASSESSMENT AND PLAN / ED COURSE  Pertinent labs & imaging results that were available during my care of the patient were reviewed by me and considered in my medical decision making (see chart for details).  Patient given Bactrim DS in the emergency department will be prescribed same for home      ____________________________________________  FINAL CLINICAL IMPRESSION(S) / ED DIAGNOSES  Final diagnoses:  Cellulitis of left lower extremity     MEDICATIONS GIVEN DURING THIS VISIT:  Medications  sodium chloride  0.9 % bolus 1,000 mL (0 mLs Intravenous Stopped 06/14/16 0641)  clindamycin (CLEOCIN) IVPB 900 mg (0 mg Intravenous Stopped 06/14/16 0606)  morphine 2 MG/ML injection 2 mg (2 mg Intravenous Given 06/14/16 0535)  ondansetron (ZOFRAN) injection 4 mg (4 mg Intravenous Given 06/14/16 0534)     NEW OUTPATIENT MEDICATIONS STARTED DURING THIS VISIT:  Discharge Medication List as of 06/14/2016  6:45 AM    START taking these medications   Details  sulfamethoxazole-trimethoprim (BACTRIM DS,SEPTRA DS) 800-160 MG tablet Take 1 tablet by mouth 2 (two) times daily., Starting Fri 06/14/2016, Until Mon 06/24/2016, Print    oxyCODONE-acetaminophen (ROXICET) 5-325 MG tablet Take 1 tablet by mouth every 4 (four) hours as needed for severe pain., Starting Fri 06/14/2016, Print        Discharge Medication List as of 06/14/2016  6:45 AM      Discharge Medication List as of 06/14/2016  6:45 AM       Note:  This document was prepared using Dragon voice recognition software and may include unintentional dictation errors.    Darci Currentandolph N Brown, MD 06/15/16 [phone]

## 2016-06-14 NOTE — ED Triage Notes (Signed)
Patient states that he was treated for cellulitis to his left lower leg in December. Patient states that this morning he started having pain in his left lower leg, warm to the touch and fever.

## 2016-06-17 LAB — CULTURE, BLOOD (SINGLE)

## 2016-06-18 ENCOUNTER — Encounter: Payer: Self-pay | Admitting: Medical Oncology

## 2016-06-18 ENCOUNTER — Inpatient Hospital Stay
Admission: EM | Admit: 2016-06-18 | Discharge: 2016-06-20 | DRG: 872 | Disposition: A | Discharge status: Home / Self Care | Payer: Medicaid Other | Attending: Internal Medicine | Admitting: Internal Medicine

## 2016-06-18 DIAGNOSIS — Z7982 Long term (current) use of aspirin: Secondary | ICD-10-CM

## 2016-06-18 DIAGNOSIS — I10 Essential (primary) hypertension: Secondary | ICD-10-CM | POA: Diagnosis present

## 2016-06-18 DIAGNOSIS — L03116 Cellulitis of left lower limb: Secondary | ICD-10-CM | POA: Diagnosis present

## 2016-06-18 DIAGNOSIS — F410 Panic disorder [episodic paroxysmal anxiety] without agoraphobia: Secondary | ICD-10-CM | POA: Diagnosis present

## 2016-06-18 DIAGNOSIS — Z79899 Other long term (current) drug therapy: Secondary | ICD-10-CM | POA: Diagnosis not present

## 2016-06-18 DIAGNOSIS — Z888 Allergy status to other drugs, medicaments and biological substances status: Secondary | ICD-10-CM

## 2016-06-18 DIAGNOSIS — A419 Sepsis, unspecified organism: Principal | ICD-10-CM | POA: Diagnosis present

## 2016-06-18 DIAGNOSIS — R1084 Generalized abdominal pain: Secondary | ICD-10-CM

## 2016-06-18 DIAGNOSIS — R7881 Bacteremia: Secondary | ICD-10-CM | POA: Diagnosis present

## 2016-06-18 DIAGNOSIS — F329 Major depressive disorder, single episode, unspecified: Secondary | ICD-10-CM | POA: Diagnosis present

## 2016-06-18 DIAGNOSIS — Z8249 Family history of ischemic heart disease and other diseases of the circulatory system: Secondary | ICD-10-CM

## 2016-06-18 DIAGNOSIS — L0390 Cellulitis, unspecified: Secondary | ICD-10-CM | POA: Diagnosis present

## 2016-06-18 LAB — CBC WITH DIFFERENTIAL/PLATELET
BASOS ABS: 0.1 10*3/uL (ref 0–0.1)
BASOS PCT: 1 %
EOS ABS: 0.1 10*3/uL (ref 0–0.7)
Eosinophils Relative: 2 %
HCT: 42 % (ref 40.0–52.0)
HEMOGLOBIN: 14.6 g/dL (ref 13.0–18.0)
Lymphocytes Relative: 32 %
Lymphs Abs: 3.2 10*3/uL (ref 1.0–3.6)
MCH: 33 pg (ref 26.0–34.0)
MCHC: 34.7 g/dL (ref 32.0–36.0)
MCV: 95.2 fL (ref 80.0–100.0)
Monocytes Absolute: 0.6 10*3/uL (ref 0.2–1.0)
Monocytes Relative: 6 %
NEUTROS PCT: 59 %
Neutro Abs: 5.8 10*3/uL (ref 1.4–6.5)
Platelets: 335 10*3/uL (ref 150–440)
RBC: 4.41 MIL/uL (ref 4.40–5.90)
RDW: 12.5 % (ref 11.5–14.5)
WBC: 9.8 10*3/uL (ref 3.8–10.6)

## 2016-06-18 LAB — BASIC METABOLIC PANEL
Anion gap: 10 (ref 5–15)
BUN: 13 mg/dL (ref 6–20)
CALCIUM: 9.3 mg/dL (ref 8.9–10.3)
CHLORIDE: 104 mmol/L (ref 101–111)
CO2: 23 mmol/L (ref 22–32)
CREATININE: 0.84 mg/dL (ref 0.61–1.24)
GFR calc non Af Amer: >60 mL/min (ref >60)
Glucose, Bld: 107 mg/dL — ABNORMAL HIGH (ref 65–99)
Potassium: 3.6 mmol/L (ref 3.5–5.1)
SODIUM: 137 mmol/L (ref 135–145)

## 2016-06-18 LAB — PROCALCITONIN: PROCALCITONIN: 0.32 ng/mL

## 2016-06-18 MED ORDER — SODIUM CHLORIDE 0.9 % IV BOLUS (SEPSIS)
1000.0000 mL | Freq: Once | INTRAVENOUS | Status: AC
  Administered 2016-06-18: 1000 mL via INTRAVENOUS

## 2016-06-18 MED ORDER — DEXTROSE 5 % IV SOLN: 1.0000 g | Freq: Once | INTRAVENOUS | Status: DC

## 2016-06-18 MED ORDER — ACETAMINOPHEN 325 MG PO TABS: 650.0000 mg | ORAL_TABLET | Freq: Four times a day (QID) | ORAL | Status: DC | PRN

## 2016-06-18 MED ORDER — ZOLPIDEM TARTRATE 5 MG PO TABS
5.0000 mg | ORAL_TABLET | Freq: Every evening | ORAL | Status: DC | PRN
  Administered 2016-06-18 – 2016-06-19 (×2): 5 mg via ORAL
  Filled 2016-06-18 (×2): qty 1

## 2016-06-18 MED ORDER — SODIUM CHLORIDE 0.9 % IV SOLN
INTRAVENOUS | Status: DC
  Administered 2016-06-18 – 2016-06-19 (×2): via INTRAVENOUS

## 2016-06-18 MED ORDER — OXYCODONE-ACETAMINOPHEN 5-325 MG PO TABS: 1.0000 | ORAL_TABLET | ORAL | Status: DC | PRN

## 2016-06-18 MED ORDER — CEFAZOLIN SODIUM-DEXTROSE 2-3 GM-% IV SOLR
2.0000 g | Freq: Three times a day (TID) | INTRAVENOUS | Status: DC
  Administered 2016-06-18 – 2016-06-20 (×5): 2 g via INTRAVENOUS
  Filled 2016-06-18 (×7): qty 50

## 2016-06-18 MED ORDER — ENOXAPARIN SODIUM 40 MG/0.4ML ~~LOC~~ SOLN
40.0000 mg | SUBCUTANEOUS | Status: DC
  Administered 2016-06-18 – 2016-06-19 (×2): 40 mg via SUBCUTANEOUS
  Filled 2016-06-18 (×2): qty 0.4

## 2016-06-18 MED ORDER — ONDANSETRON HCL 4 MG PO TABS: 4.0000 mg | ORAL_TABLET | Freq: Four times a day (QID) | ORAL | Status: DC | PRN

## 2016-06-18 MED ORDER — ACETAMINOPHEN 650 MG RE SUPP: 650.0000 mg | Freq: Four times a day (QID) | RECTAL | Status: DC | PRN

## 2016-06-18 MED ORDER — BUPROPION HCL ER (XL) 150 MG PO TB24
150.0000 mg | ORAL_TABLET | ORAL | Status: DC
  Filled 2016-06-18: qty 1

## 2016-06-18 MED ORDER — HYDROCHLOROTHIAZIDE 25 MG PO TABS
25.0000 mg | ORAL_TABLET | Freq: Every day | ORAL | Status: DC
Start: 2016-06-19 — End: 2016-06-20
  Administered 2016-06-20: 25 mg via ORAL
  Filled 2016-06-18: qty 1

## 2016-06-18 MED ORDER — ONDANSETRON HCL 4 MG/2ML IJ SOLN: 4.0000 mg | Freq: Four times a day (QID) | INTRAMUSCULAR | Status: DC | PRN

## 2016-06-18 MED ORDER — CEFTRIAXONE SODIUM-DEXTROSE 1-3.74 GM-% IV SOLR
1.0000 g | Freq: Once | INTRAVENOUS | Status: AC
Start: 2016-06-18 — End: 2016-06-18
  Administered 2016-06-18: 1 g via INTRAVENOUS
  Filled 2016-06-18: qty 50

## 2016-06-18 MED ORDER — CEFAZOLIN SODIUM-DEXTROSE 2-4 GM/100ML-% IV SOLN
2.0000 g | Freq: Three times a day (TID) | INTRAVENOUS | Status: DC
  Filled 2016-06-18 (×3): qty 100

## 2016-06-18 NOTE — ED Provider Notes (Signed)
[facility] Emergency Department Provider Note  ____________________________________________   First MD Initiated Contact with Patient 06/18/16 1605     (approximate)  I have reviewed the triage vital signs and the nursing notes.   HISTORY  Chief Complaint Blood Infection    HPI Kyle Hood is a 61 y.o. male who comes to the emergency department as a callback for positive blood culture. He was seen in our emergency department on Friday which was 5 days ago for left lower extremity cellulitis. She was treated with oral Bactrim and discharged home that evening. She said that he feels his leg has not improved if she has had fevers intermittently at home. At that time he had an ultrasound of his left lower extremity which is negative for clot. He says he is bilateral cellulitis in his left lower extremity since December 2017. He reports fatigue and overall malaise. She denies IV drug history. He denies chest pain or shortness of breath. He denies abdominal pain nausea or vomiting. He has mild to moderate aching nonradiating discomfort in his left lower extremity. Worse with movement and improved with rest.   Past Medical History:  Diagnosis Date  . Anxiety   . Arthritis   . Depression   . Fatty liver   . Hepatitis B   . Hypertension   . Panic attacks     Patient Active Problem List   Diagnosis Date Noted  . Claudication of both lower extremities (HCC) 03/25/2016  . Hypertension 10/26/2014  . Lower extremity edema 10/26/2014  . Hyperlipidemia 10/26/2014  . Transient loss of consciousness 09/08/2014    Past Surgical History:  Procedure Laterality Date  . PROSTATE BIOPSY      Prior to Admission medications   Medication Sig Start Date End Date Taking? Authorizing Provider  buPROPion (WELLBUTRIN XL) 150 MG 24 hr tablet Take 150 mg by mouth every morning.    Historical Provider, MD  hydrochlorothiazide (HYDRODIURIL) 25 MG tablet Take 25 mg by mouth  daily.    Historical Provider, MD  oxyCODONE-acetaminophen (ROXICET) 5-325 MG tablet Take 1 tablet by mouth every 4 (four) hours as needed for severe pain. 06/14/16   Charlynne Panderavid Hsienta Yao, MD  sulfamethoxazole-trimethoprim (BACTRIM DS,SEPTRA DS) 800-160 MG tablet Take 1 tablet by mouth 2 (two) times daily. 06/14/16 06/24/16  Darci Currentandolph N Brown, MD    Allergies Lisinopril  Family History  Problem Relation Age of Onset  . Cancer Mother     Pancreatic  . Diabetes Mother   . Hypertension Mother   . Varicose Veins Mother   . Heart disease Father     CABG  . Cancer Father     Jaw  . Aortic aneurysm Father   . Congestive Heart Failure Father   . Hypertension Father   . Varicose Veins Father     Social History Social History  Substance Use Topics  . Smoking status: Never Smoker  . Smokeless tobacco: Never Used  . Alcohol use Yes     Comment: Occasional wine     Review of Systems Constitutional:Reports fevers Eyes: No visual changes. ENT: No sore throat. Cardiovascular: Denies chest pain. Respiratory: Denies shortness of breath. Gastrointestinal: No abdominal pain.  No nausea, no vomiting.  No diarrhea.  No constipation. Genitourinary: Negative for dysuria. Musculoskeletal: Positive for leg pain. Skin: Negative for rash. Neurological: Negative for headaches, focal weakness or numbness.  10-point ROS otherwise negative.  ____________________________________________   PHYSICAL EXAM:  VITAL SIGNS: ED Triage Vitals [06/18/16 1448]  Enc Vitals Group     BP (!) 157/87     Pulse Rate 88     Resp 18     Temp 97.9 F (36.6 C)     Temp Source Oral     SpO2 98 %     Weight 246 lb (111.6 kg)     Height 6' (1.829 m)     Head Circumference      Peak Flow      Pain Score 3     Pain Loc      Pain Edu?      Excl. in GC?     Constitutional: Alert and oriented 4 well-appearing nontoxic no diaphoresis recent full sentences Eyes: Conjunctivae are normal. PERRL. EOMI. Head:  Atraumatic. Nose: No congestion/rhinnorhea. Mouth/Throat: Mucous membranes are moist.  Oropharynx non-erythematous. Neck: No stridor.   Cardiovascular: Regular rate and rhythm 3/6 systolic ejection murmur best heard right sternal border Respiratory: Normal respiratory effort.  No retractions. Lungs CTAB. Gastrointestinal: Soft and nontender. No distention. No abdominal bruits. No CVA tenderness. Musculoskeletal: Left leg with 2+ pitting edema to knee Neurologic:  Normal speech and language. No gross focal neurologic deficits are appreciated. No gait instability. Skin:  Skin of her left shin is erythematous and warm and somewhat tender to the touch Noble blisters sloughing or crepitus and no other signs of necrotizing soft tissue infection Psychiatric: Mood and affect are normal. Speech and behavior are normal.  ____________________________________________   LABS (all labs ordered are listed, but only abnormal results are displayed)  Labs Reviewed  BASIC METABOLIC PANEL - Abnormal; Notable for the following:       Result Value   Glucose, Bld 107 (*)    All other components within normal limits  CULTURE, BLOOD (ROUTINE X 2)  CULTURE, BLOOD (ROUTINE X 2)  CBC WITH DIFFERENTIAL/PLATELET  PROCALCITONIN   ____________________________________________  EKG  ____________________________________________  RADIOLOGY   ____________________________________________   PROCEDURES  Procedure(s) performed: no  Procedures  Critical Care performed: no  ____________________________________________   INITIAL IMPRESSION / ASSESSMENT AND PLAN / ED COURSE  Pertinent labs & imaging results that were available during my care of the patient were reviewed by me and considered in my medical decision making (see chart for details).  While the patient appears well on arrival she reports worsening left lower extremity erythema and swelling with persistent fevers. His Bactrim would not cover the  group B strep. New cultures will be drawn now as well as ceftriaxone as the culture was sensitive. He'll require inpatient admission. He is not currently septic.  ----------------------------------------- 4:42 PM on 06/18/2016 -----------------------------------------  I discussed the case with the hospitalist Dr. Allena Katz who is graciously agreed to admit the patient to his service.      ____________________________________________   FINAL CLINICAL IMPRESSION(S) / ED DIAGNOSES  Final diagnoses:  Bacteremia      NEW MEDICATIONS STARTED DURING THIS VISIT:  New Prescriptions   No medications on file     Note:  This document was prepared using Dragon voice recognition software and may include unintentional dictation errors.     Merrily Brittle, MD 06/18/16 [phone]

## 2016-06-18 NOTE — Progress Notes (Signed)
Admission: Patient alert and oriented. Vital signs stable. Complaining of mild discomfort in left lower leg. Has small patch of redness, patient said it has been that way since December of last year. RN elevated left on 2 pillow and marked the skin.   Kyle HeckMelanie Okie Hood

## 2016-06-18 NOTE — ED Notes (Signed)
Patient denies pain and is resting comfortably.  

## 2016-06-18 NOTE — H&P (Addendum)
[facility] [place] [facility]   PATIENT NAME: Kyle Hood    MR#:  [id number]  DATE OF BIRTH:  07/22/1955  DATE OF ADMISSION:  06/18/2016  PRIMARY CARE PHYSICIAN: Presley Raddle, MD   REQUESTING/REFERRING PHYSICIAN: Dr. Merrily Brittle  CHIEF COMPLAINT:   Chief Complaint  Patient presents with  . Blood Infection    HISTORY OF PRESENT ILLNESS:  Kyle Hood  is a 61 y.o. male with a known history of Hypertension, anxiety and panic attacks, arthritis presents to the hospital secondary to left lower extremity swelling and redness. Symptoms started almost 2 months ago with worsening swelling of his left leg and redness. Finished outpatient antibiotics with transient improvement. Since then started to feel fatigued and malaise all the time. The swelling started to get worse again last week and so presented to the emergency room. He also had fevers and chills at the time. WBC here was 18.9 K. Doppler done for the left leg at the time did not reveal any DVT. Patient was discharged on Bactrim. His chills and fevers have improved. But his erythema and swelling still persists. He was asked to come back to the emergency room today as his blood cultures were growing group B streptococcus which usually do not clear by Bactrim. Today the white count is much better. His left leg is still swollen and erythematous. He is being admitted for clearance of his bacteremia and IV antibiotics.  PAST MEDICAL HISTORY:   Past Medical History:  Diagnosis Date  . Anxiety   . Arthritis   . Depression   . Fatty liver   . Hepatitis B   . Hypertension   . Panic attacks     PAST SURGICAL HISTORY:   Past Surgical History:  Procedure Laterality Date  . PROSTATE BIOPSY      SOCIAL HISTORY:   Social History  Substance Use Topics  . Smoking status: Never Smoker  . Smokeless tobacco: Never Used  . Alcohol use Yes     Comment: Occasional wine     FAMILY HISTORY:   Family  History  Problem Relation Age of Onset  . Cancer Mother     Pancreatic  . Diabetes Mother   . Hypertension Mother   . Varicose Veins Mother   . Heart disease Father     CABG  . Cancer Father     Jaw  . Aortic aneurysm Father   . Congestive Heart Failure Father   . Hypertension Father   . Varicose Veins Father     DRUG ALLERGIES:   Allergies  Allergen Reactions  . Lisinopril Cough    REVIEW OF SYSTEMS:   Review of Systems  Constitutional: Positive for malaise/fatigue. Negative for chills, fever and weight loss.  HENT: Negative for ear discharge, ear pain, hearing loss and nosebleeds.   Eyes: Negative for blurred vision, double vision and photophobia.  Respiratory: Negative for cough, hemoptysis, shortness of breath and wheezing.   Cardiovascular: Negative for chest pain, palpitations, orthopnea and leg swelling.  Gastrointestinal: Positive for abdominal pain. Negative for constipation, diarrhea, melena, nausea and vomiting.  Genitourinary: Negative for dysuria and urgency.  Musculoskeletal: Positive for myalgias. Negative for back pain and neck pain.  Skin: Negative for rash.  Neurological: Negative for dizziness, tingling, tremors, sensory change, speech change, focal weakness and headaches.  Endo/Heme/Allergies: Does not bruise/bleed easily.  Psychiatric/Behavioral: Negative for depression.    MEDICATIONS AT HOME:   Prior to Admission medications   Medication Sig Start  Date End Date Taking? Authorizing Provider  aspirin 325 MG EC tablet Take 325 mg by mouth daily.   Yes Historical Provider, MD  buPROPion (WELLBUTRIN XL) 150 MG 24 hr tablet Take 150 mg by mouth every morning.   Yes Historical Provider, MD  hydrochlorothiazide (HYDRODIURIL) 25 MG tablet Take 25 mg by mouth daily.   Yes Historical Provider, MD  oxyCODONE-acetaminophen (ROXICET) 5-325 MG tablet Take 1 tablet by mouth every 4 (four) hours as needed for severe pain. 06/14/16  Yes Charlynne Pander, MD    sulfamethoxazole-trimethoprim (BACTRIM DS,SEPTRA DS) 800-160 MG tablet Take 1 tablet by mouth 2 (two) times daily. 06/14/16 06/24/16 Yes Darci Current, MD      VITAL SIGNS:  Blood pressure (!) 157/87, pulse 88, temperature 97.9 F (36.6 C), temperature source Oral, resp. rate 18, height 6' (1.829 m), weight 111.6 kg (246 lb), SpO2 98 %.  PHYSICAL EXAMINATION:   Physical Exam  GENERAL:  61 y.o.-year-old patient lying in the bed with no acute distress.  EYES: Pupils equal, round, reactive to light and accommodation. No scleral icterus. Extraocular muscles intact.  HEENT: Head atraumatic, normocephalic. Oropharynx and nasopharynx clear.  NECK:  Supple, no jugular venous distention. No thyroid enlargement, no tenderness.  LUNGS: Normal breath sounds bilaterally, no wheezing, rales,rhonchi or crepitation. No use of accessory muscles of respiration.  CARDIOVASCULAR: S1, S2 normal. No  rubs, or gallops. 2/6 systolic murmur present. ABDOMEN: Soft, diffuse tenderness LUQ and LLQ, o guarding or rigidity, nondistended. Bowel sounds present. No organomegaly or mass.  EXTREMITIES: left leg has 2+ edema from foot to leg, erythema anteriorly in the lower 2/3rds of the leg, No cyanosis, or clubbing.  NEUROLOGIC: Cranial nerves II through XII are intact. Muscle strength 5/5 in all extremities. Sensation intact. Gait not checked.  PSYCHIATRIC: The patient is alert and oriented x 3. Flat affect SKIN: No obvious rash, lesion, or ulcer.   LABORATORY PANEL:   CBC  Recent Labs Lab 06/18/16 1451  WBC 9.8  HGB 14.6  HCT 42.0  PLT 335   ------------------------------------------------------------------------------------------------------------------  Chemistries   Recent Labs Lab 06/14/16 0519 06/18/16 1451  NA 137 137  K 3.8 3.6  CL 100* 104  CO2 26 23  GLUCOSE 140* 107*  BUN 15 13  CREATININE 1.04 0.84  CALCIUM 9.2 9.3  AST 37  --   ALT 33  --   ALKPHOS 77  --   BILITOT 0.8  --     ------------------------------------------------------------------------------------------------------------------  Cardiac Enzymes No results for input(s): TROPONINI in the last 168 hours. ------------------------------------------------------------------------------------------------------------------  RADIOLOGY:  No results found.  EKG:   Orders placed or performed during the hospital encounter of 12/30/15  . EKG 12-Lead  . EKG 12-Lead  . EKG    IMPRESSION AND PLAN:   Kyle Hood  is a 61 y.o. male with a known history of Hypertension, anxiety and panic attacks, arthritis presents to the hospital secondary to left lower extremity swelling and redness.  #1 Sepsis on presentation- last week, now resolved, fevers and wbc have improved But has 2/2 positive blood cultures with Group B strep - change ABX to ancef, f/u repeat blood cultures - If negative and clinically improving, can be discharged on oral ABX - left leg swelling has been going on even prior to cellulitis, dopplers negative for DVT - mark the erythema and follow - keep the leg elevated on 2 pillows - pain meds prn - ECHO to r/o vegetations as bacteremia and soft murmur on  exam.  #2 Diffuse non specific left sided abdominal pain- no changes on bowel habits - check abd [place]. LFTs normal  #3 HTN- continue HCTZ. Monitor electrolytes  #4 Anxiety/depression- continue home meds- on welbutrin  #5 DVT Prophylaxis- lovenox    All the records are reviewed and case discussed with ED provider. Management plans discussed with the patient, family and they are in agreement.  CODE STATUS: Full code  TOTAL TIME TAKING CARE OF THIS PATIENT: 50 minutes.    Thomas Mabry M.D on 06/18/2016 at 5:35 PM  Between 7am to 6pm - Pager - [phone]  After 6pm go to [email or web address] - Social research officer, government  [facility]  Office  [phone]  CC: Primary care physician; Presley Raddle, MD

## 2016-06-18 NOTE — ED Triage Notes (Signed)
Pt was seen here 4 days ago for cellulitis, called today by pharmacist who reports that pt had positive blood culture of group B strep. Pt reports no improvement of LLE cellulitis.

## 2016-06-18 NOTE — Progress Notes (Signed)
Spoke to patient on phone and informed him on his positive blood culture and that the antibiotic he was discharged on (bactrim) would not be effective.  Patient expressed concern that his LLE cellulitis was not getting better and patient feels weak. Patient was growing Group B strep in 1/4 blood cultures. Initially came in tachycardic w/ HR of 123 and WBC of 18.9.  Patient will be returning to ER for further evaluation and possible IV antibiotics.  Thomasene Rippleavid Jaidyn Usery, PharmD, BCPS Clinical Pharmacist 06/18/2016

## 2016-06-18 NOTE — Progress Notes (Signed)
ANTIBIOTIC CONSULT NOTE - INITIAL  Pharmacy Consult for Cefazolin dosing Indication: bacteremia  Allergies  Allergen Reactions  . Lisinopril Cough    Patient Measurements: Height: 6' (182.9 cm) Weight: 246 lb (111.6 kg) IBW/kg (Calculated) : 77.6 Adjusted Body Weight: 91.2 kg  Vital Signs: Temp: 97.9 F (36.6 C) (02/20 1448) Temp Source: Oral (02/20 1448) BP: 150/80 (02/20 1700) Pulse Rate: 86 (02/20 1700) Intake/Output from previous day: No intake/output data recorded. Intake/Output from this shift: Total I/O In: 5 [I.V.:5] Out: -   Labs:  Recent Labs  06/18/16 1451  WBC 9.8  HGB 14.6  PLT 335  CREATININE 0.84   Estimated Creatinine Clearance (by C-G formula based on SCr of 0.84 mg/dL) Male: 161.0 mL/min Male: 122.1 mL/min No results for input(s): VANCOTROUGH, VANCOPEAK, VANCORANDOM, GENTTROUGH, GENTPEAK, GENTRANDOM, TOBRATROUGH, TOBRAPEAK, TOBRARND, AMIKACINPEAK, AMIKACINTROU, AMIKACIN in the last 72 hours.   Microbiology: Recent Results (from the past 720 hour(s))  Blood culture (single)     Status: Abnormal   Collection Time: 06/14/16  5:17 AM  Result Value Ref Range Status   Specimen Description BLOOD RIGHT ARM  Final   Special Requests   Final    BOTTLES DRAWN AEROBIC AND ANAEROBIC ANA12ML AER11ML   Culture  Setup Time   Final    GRAM POSITIVE COCCI ANAEROBIC BOTTLE ONLY CRITICAL RESULT CALLED TO, READ BACK BY AND VERIFIED WITH: JASON ROBBINS AT 1951 ON 06/14/2016 JLJ Performed at [facility], [address], [place], [place] [address]    Culture GROUP B STREP(S.AGALACTIAE)ISOLATED (A)  Final   Report Status 06/17/2016 FINAL  Final   Organism ID, Bacteria GROUP B STREP(S.AGALACTIAE)ISOLATED  Final      Susceptibility   Group b strep(s.agalactiae)isolated - MIC*    CLINDAMYCIN <=0.25 SENSITIVE Sensitive     AMPICILLIN <=0.25 SENSITIVE Sensitive     ERYTHROMYCIN <=0.12 SENSITIVE Sensitive     VANCOMYCIN 0.5 SENSITIVE Sensitive    CEFTRIAXONE <=0.12 SENSITIVE Sensitive     LEVOFLOXACIN 1 SENSITIVE Sensitive     * GROUP B STREP(S.AGALACTIAE)ISOLATED  Blood Culture ID Panel (Reflexed)     Status: Abnormal   Collection Time: 06/14/16  5:17 AM  Result Value Ref Range Status   Enterococcus species NOT DETECTED NOT DETECTED Final   Listeria monocytogenes NOT DETECTED NOT DETECTED Final   Staphylococcus species NOT DETECTED NOT DETECTED Final   Staphylococcus aureus NOT DETECTED NOT DETECTED Final   Streptococcus species DETECTED (A) NOT DETECTED Final    Comment: CRITICAL RESULT CALLED TO, READ BACK BY AND VERIFIED WITH: JASON ROBBINS AT 1951 ON 06/14/2016 JLJ    Streptococcus agalactiae DETECTED (A) NOT DETECTED Final    Comment: CRITICAL RESULT CALLED TO, READ BACK BY AND VERIFIED WITH: JASON ROBBINS AT 1951 ON 06/14/2016 JLJ    Streptococcus pneumoniae NOT DETECTED NOT DETECTED Final   Streptococcus pyogenes NOT DETECTED NOT DETECTED Final   Acinetobacter baumannii NOT DETECTED NOT DETECTED Final   Enterobacteriaceae species NOT DETECTED NOT DETECTED Final   Enterobacter cloacae complex NOT DETECTED NOT DETECTED Final   Escherichia coli NOT DETECTED NOT DETECTED Final   Klebsiella oxytoca NOT DETECTED NOT DETECTED Final   Klebsiella pneumoniae NOT DETECTED NOT DETECTED Final   Proteus species NOT DETECTED NOT DETECTED Final   Serratia marcescens NOT DETECTED NOT DETECTED Final   Haemophilus influenzae NOT DETECTED NOT DETECTED Final   Neisseria meningitidis NOT DETECTED NOT DETECTED Final   Pseudomonas aeruginosa NOT DETECTED NOT DETECTED Final   Candida albicans NOT  DETECTED NOT DETECTED Final   Candida glabrata NOT DETECTED NOT DETECTED Final   Candida krusei NOT DETECTED NOT DETECTED Final   Candida parapsilosis NOT DETECTED NOT DETECTED Final   Candida tropicalis NOT DETECTED NOT DETECTED Final    Medical History: Past Medical History:  Diagnosis Date  . Anxiety   . Arthritis   . Depression   . Fatty  liver   . Hepatitis B   . Hypertension   . Panic attacks     Medications:  Scheduled:  . [START ON 06/19/2016] buPROPion  150 mg Oral BH-q7a  . ceFAZolin  2 g Intravenous Q8H  . enoxaparin (LOVENOX) injection  40 mg Subcutaneous Q24H  . [START ON 06/19/2016] hydrochlorothiazide  25 mg Oral Daily   Assessment: Patient admitted for bacteremia growing Group B strep 1/4 Bcx. Was previously seen in ED and dc on bactrim, asked to come back in to be treated w/ IV abx and clearance of blood cx  Goal of Therapy:  Resolution of signs/symptoms of infection/cellulitis  Plan:  Follow up culture results  Thomasene Rippleavid Cylah Fannin, PharmD, BCPS Clinical Pharmacist 06/18/2016

## 2016-06-19 ENCOUNTER — Inpatient Hospital Stay (HOSPITAL_COMMUNITY)
Admit: 2016-06-19 | Discharge: 2016-06-19 | Disposition: A | Discharge status: Home / Self Care | Payer: Medicaid Other | Attending: Internal Medicine | Admitting: Internal Medicine

## 2016-06-19 ENCOUNTER — Inpatient Hospital Stay: Payer: Medicaid Other

## 2016-06-19 DIAGNOSIS — R7881 Bacteremia: Secondary | ICD-10-CM

## 2016-06-19 LAB — BASIC METABOLIC PANEL
ANION GAP: 7 (ref 5–15)
BUN: 11 mg/dL (ref 6–20)
CALCIUM: 8.8 mg/dL — AB (ref 8.9–10.3)
CO2: 24 mmol/L (ref 22–32)
Chloride: 108 mmol/L (ref 101–111)
Creatinine, Ser: 0.97 mg/dL (ref 0.61–1.24)
GLUCOSE: 123 mg/dL — AB (ref 65–99)
POTASSIUM: 4.3 mmol/L (ref 3.5–5.1)
SODIUM: 139 mmol/L (ref 135–145)

## 2016-06-19 LAB — CBC
HCT: 41.5 % (ref 40.0–52.0)
HEMOGLOBIN: 14.6 g/dL (ref 13.0–18.0)
MCH: 33.8 pg (ref 26.0–34.0)
MCHC: 35.1 g/dL (ref 32.0–36.0)
MCV: 96.3 fL (ref 80.0–100.0)
Platelets: 309 10*3/uL (ref 150–440)
RBC: 4.31 MIL/uL — AB (ref 4.40–5.90)
RDW: 13 % (ref 11.5–14.5)
WBC: 8.7 10*3/uL (ref 3.8–10.6)

## 2016-06-19 LAB — ECHOCARDIOGRAM COMPLETE
HEIGHTINCHES: 72 in
Weight: 3936 oz

## 2016-06-19 NOTE — Progress Notes (Signed)
[facility] - [place] at [facility]   PATIENT NAME: Kyle Hood    MR#:  [id number]  DATE OF BIRTH:  11-08-1955  SUBJECTIVE: admitted  for left leg cellulitis, positive blood cultures with group B strep. Says he feels lot beter today,no fever,decreased leg redness.  CHIEF COMPLAINT:   Chief Complaint  Patient presents with  . Blood Infection    REVIEW OF SYSTEMS:   ROS CONSTITUTIONAL: No fever, fatigue or weakness.  EYES: No blurred or double vision.  EARS, NOSE, AND THROAT: No tinnitus or ear pain.  RESPIRATORY: No cough, shortness of breath, wheezing or hemoptysis.  CARDIOVASCULAR: No chest pain, orthopnea, edema.  GASTROINTESTINAL: No nausea, vomiting, diarrhea or abdominal pain.  GENITOURINARY: No dysuria, hematuria.  ENDOCRINE: No polyuria, nocturia,  HEMATOLOGY: No anemia, easy bruising or bleeding SKIN: No rash or lesion. MUSCULOSKELETAL: No joint pain or arthritis.   NEUROLOGIC: No tingling, numbness, weakness.  PSYCHIATRY: No anxiety or depression.   DRUG ALLERGIES:   Allergies  Allergen Reactions  . Lisinopril Cough    VITALS:  Blood pressure 140/77, pulse 77, temperature 97.8 F (36.6 C), temperature source Oral, resp. rate 18, height 6' (1.829 m), weight 111.6 kg (246 lb), SpO2 98 %.  PHYSICAL EXAMINATION:  GENERAL:  61 y.o.-year-old patient lying in the bed with no acute distress.  EYES: Pupils equal, round, reactive to light and accommodation. No scleral icterus. Extraocular muscles intact.  HEENT: Head atraumatic, normocephalic. Oropharynx and nasopharynx clear.  NECK:  Supple, no jugular venous distention. No thyroid enlargement, no tenderness.  LUNGS: Normal breath sounds bilaterally, no wheezing, rales,rhonchi or crepitation. No use of accessory muscles of respiration.  CARDIOVASCULAR: S1, S2 normal. No murmurs, rubs, or gallops.  ABDOMEN: Soft, nontender, nondistended. Bowel sounds present. No organomegaly or mass.   EXTREMITIES:Patient has erythematous area on the left leg.  NEUROLOGIC: Cranial nerves II through XII are intact. Muscle strength 5/5 in all extremities. Sensation intact. Gait not checked.  PSYCHIATRIC: The patient is alert and oriented x 3.  SKIN: No obvious rash, lesion, or ulcer.    LABORATORY PANEL:   CBC  Recent Labs Lab 06/19/16 0453  WBC 8.7  HGB 14.6  HCT 41.5  PLT 309   ------------------------------------------------------------------------------------------------------------------  Chemistries   Recent Labs Lab 06/14/16 0519  06/19/16 0453  NA 137  < > 139  K 3.8  < > 4.3  CL 100*  < > 108  CO2 26  < > 24  GLUCOSE 140*  < > 123*  BUN 15  < > 11  CREATININE 1.04  < > 0.97  CALCIUM 9.2  < > 8.8*  AST 37  --   --   ALT 33  --   --   ALKPHOS 77  --   --   BILITOT 0.8  --   --   < > = values in this interval not displayed. ------------------------------------------------------------------------------------------------------------------  Cardiac Enzymes No results for input(s): TROPONINI in the last 168 hours. ------------------------------------------------------------------------------------------------------------------  RADIOLOGY:  US Abdomen Complete  Result Date: 06/19/2016 CLINICAL DATA:  Abdominal pain. EXAM: ABDOMEN ULTRASOUND COMPLETE COMPARISON:  No recent prior . FINDINGS: Gallbladder: No gallstones or wall thickening visualized. No sonographic [name] sign noted by sonographer. Common bile duct: Diameter: 4.0 mm Liver: Liver is echogenic consistent fatty infiltrate disease. IVC: No abnormality visualized. Pancreas: Visualized portion unremarkable. Spleen: Size and appearance within normal limits. Right Kidney: Length: 11.2 cm. Echogenicity within normal limits. No mass or hydronephrosis visualized. Left Kidney: Length:  11.6 cm. Echogenicity within normal limits. No mass or hydronephrosis visualized. Abdominal aorta: No aneurysm visualized. Other  findings: None. IMPRESSION: Liver is echogenic consistent fatty infiltration and/or hepatocellular disease. No acute or focal abnormality otherwise noted. Electronically Signed   By: Maisie Fus  Register   On: 06/19/2016 09:03    EKG:   Orders placed or performed during the hospital encounter of 12/30/15  . EKG 12-Lead  . EKG 12-Lead  . EKG    ASSESSMENT AND PLAN:   Sepsis present on admission secondary to left leg cellulitis: Clinically improving, WBC  Is down, follow repeat blood culture results. Continue IV Ancef, echocardiogram reviewed, did not show any vegetations. Elevate the leg #2 essential hypertension: Controlled #3. anxiety and depression: Continue home medicine #4 .abdominal pain nonspecific: Pain resolved. Ultrasound of abdomen showed only fatty liver. No other abnormalities. Discharge home tomorrow with if the blood cultures are negative With Levaquin for 7 days      All the records are reviewed and case discussed with Care Management/Social Workerr. Management plans discussed with the patient, family and they are in agreement.  CODE STATUS:full  TOTAL TIME TAKING CARE OF THIS PATIENT: 35 minutes.   POSSIBLE D/C IN 1-2DAYS, DEPENDING ON CLINICAL CONDITION.   Katha Hamming M.D on 06/19/2016 at 2:22 PM  Between 7am to 6pm - Pager - [phone]  After 6pm go to [email or web address] - password [email or web address]  [facility]  Office  [phone]  CC: Primary care physician; Presley Raddle, MD   Note: This dictation was prepared with Dragon dictation along with smaller phrase technology. Any transcriptional errors that result from this process are unintentional.

## 2016-06-19 NOTE — Progress Notes (Signed)
Pt requesting to eat, MD notified. Verbal order received MD Luberta MutterKonidena to advance diet to heart healthy. Orders placed.

## 2016-06-19 NOTE — Progress Notes (Signed)
*  PRELIMINARY RESULTS* Echocardiogram 2D Echocardiogram has been performed.  Cristela BlueHege, Obi Scrima 06/19/2016, 8:27 AM

## 2016-06-20 LAB — HIV ANTIBODY (ROUTINE TESTING W REFLEX): HIV SCREEN 4TH GENERATION: NONREACTIVE

## 2016-06-20 MED ORDER — LEVOFLOXACIN 750 MG PO TABS: 750.0000 mg | ORAL_TABLET | Freq: Every day | ORAL | 0 refills | Status: DC

## 2016-06-20 NOTE — Progress Notes (Signed)
Dara LordsAlan Geisinger to be D/C'd Home per MD order.  Discussed with the patient and all questions fully answered.  VSS, Skin clean, dry and intact without evidence of skin break down, no evidence of skin tears noted. IV catheter discontinued intact. Site without signs and symptoms of complications. Dressing and pressure applied.  An After Visit Summary was printed and given to the patient. Patient received prescription.  D/c education completed with patient/family including follow up instructions, medication list, d/c activities limitations if indicated, with other d/c instructions as indicated by MD - patient able to verbalize understanding, all questions fully answered.   Patient instructed to return to ED, call 911, or call MD for any changes in condition.   Patient escorted via WC, and D/C home via private auto.  Harvie HeckMelanie Dasja Brase 06/20/2016 2:58 PM

## 2016-06-20 NOTE — Plan of Care (Signed)
Problem: Skin Integrity: Goal: Skin integrity will improve Outcome: Progressing Patient's skin was marked on admission. Red area on left lower leg had gone down.  Harvie HeckMelanie Leodis Alcocer, RN

## 2016-06-20 NOTE — Discharge Summary (Signed)
Kyle Hood, is a 61 y.o. male  DOB 07/23/1955  MRN [id number].  Admission date:  06/18/2016  Admitting Physician  Enid Baas, MD  Discharge Date:  06/20/2016   Primary MD  Presley Raddle, MD  Recommendations for primary care physician for things to follow:  Follow-up with primary doctor in 1 week   Admission Diagnosis  Bacteremia [R78.81] Diffuse abdominal pain [R10.84]   Discharge Diagnosis  Bacteremia [R78.81] Diffuse abdominal pain [R10.84]   Active Problems:   Cellulitis      Past Medical History:  Diagnosis Date  . Anxiety   . Arthritis   . Depression   . Fatty liver   . Hepatitis B   . Hypertension   . Panic attacks     Past Surgical History:  Procedure Laterality Date  . PROSTATE BIOPSY         History of present illness and  Hospital Course:     Kindly see H&P for history of present illness and admission details, please review complete Labs, Consult reports and Test reports for all details in brief  HPI  from the history and physical done on the day of admission  61 year old male patient with chest hypertension, anxiety, panic attacks admitted because of left leg swelling, redness, started on Bactroban outpatient, blood cultures showed group B strep, admitted for the same with sepsis with bacteremia.  Hospital Course   #1 sepsis with group B strep bacteremia secondary to  Left leg cellulitis: Admitted to hospitalist service, antibiotics changed from Bactrim to Ancef 2 g every 8 hours, blood cultures repeat did not show any growth for 48 hours, echocardiogram did not show any vegetations. WBC improved from 18.9-8.7. Normal kidney function. Patient feels much better, decreased redness of the left leg. Discharge home with Levaquin for 10 days.  Essential hypertension: Continue  HCTZ.  Discharge Condition;stable.   Follow UP  Follow-up Information    Presley Raddle, MD On 07/01/2016.   Specialty:  Family Medicine Why:  [facility] information: [address] [place] [address] [phone]             Discharge Instructions  and  Discharge Medications      Allergies as of 06/20/2016      Reactions   Lisinopril Cough      Medication List    STOP taking these medications   sulfamethoxazole-trimethoprim 800-160 MG tablet Commonly known as:  BACTRIM DS,SEPTRA DS     TAKE these medications   aspirin 325 MG EC tablet Take 325 mg by mouth daily.   buPROPion 150 MG 24 hr tablet Commonly known as:  WELLBUTRIN XL Take 150 mg by mouth every morning.   hydrochlorothiazide 25 MG tablet Commonly known as:  HYDRODIURIL Take 25 mg by mouth daily.   levofloxacin 750 MG tablet Commonly known as:  LEVAQUIN Take 1 tablet (750 mg total) by mouth daily.   oxyCODONE-acetaminophen 5-325 MG tablet Commonly known as:  ROXICET Take 1 tablet by mouth every 4 (four) hours as needed for severe pain.         Diet and Activity recommendation: See Discharge Instructions above   Consults obtained -none   Major procedures and Radiology Reports - PLEASE review detailed and final reports for all details, in brief -     US Abdomen Complete  Result Date: 06/19/2016 CLINICAL DATA:  Abdominal pain. EXAM: ABDOMEN ULTRASOUND COMPLETE COMPARISON:  No recent prior . FINDINGS: Gallbladder: No gallstones or wall thickening visualized. No  sonographic Eulah Pont sign noted by sonographer. Common bile duct: Diameter: 4.0 mm Liver: Liver is echogenic consistent fatty infiltrate disease. IVC: No abnormality visualized. Pancreas: Visualized portion unremarkable. Spleen: Size and appearance within normal limits. Right Kidney: Length: 11.2 cm. Echogenicity within normal limits. No mass or hydronephrosis visualized. Left Kidney: Length: 11.6 cm. Echogenicity  within normal limits. No mass or hydronephrosis visualized. Abdominal aorta: No aneurysm visualized. Other findings: None. IMPRESSION: Liver is echogenic consistent fatty infiltration and/or hepatocellular disease. No acute or focal abnormality otherwise noted. Electronically Signed   By: Maisie Fus  Register   On: 06/19/2016 09:03   US Venous Img Lower Unilateral Left  Result Date: 06/14/2016 CLINICAL DATA:  Pain and swelling, cellulitis for several months. EXAM: LEFT LOWER EXTREMITY VENOUS DOPPLER ULTRASOUND TECHNIQUE: Gray-scale sonography with graded compression, as well as color Doppler and duplex ultrasound were performed to evaluate the lower extremity deep venous systems from the level of the common femoral vein and including the common femoral, femoral, profunda femoral, popliteal and calf veins including the posterior tibial, peroneal and gastrocnemius veins when visible. The superficial great saphenous vein was also interrogated. Spectral Doppler was utilized to evaluate flow at rest and with distal augmentation maneuvers in the common femoral, femoral and popliteal veins. COMPARISON:  None. FINDINGS: Contralateral Common Femoral Vein: Respiratory phasicity is normal and symmetric with the symptomatic side. No evidence of thrombus. Normal compressibility. Common Femoral Vein: No evidence of thrombus. Normal compressibility, respiratory phasicity and response to augmentation. Saphenofemoral Junction: No evidence of thrombus. Normal compressibility and flow on color Doppler imaging. Profunda Femoral Vein: No evidence of thrombus. Normal compressibility and flow on color Doppler imaging. Femoral Vein: No evidence of thrombus. Normal compressibility, respiratory phasicity and response to augmentation. Popliteal Vein: No evidence of thrombus. Normal compressibility, respiratory phasicity and response to augmentation. Calf Veins: No evidence of thrombus. Normal compressibility and flow on color Doppler imaging.  Superficial Great Saphenous Vein: No evidence of thrombus. Normal compressibility and flow on color Doppler imaging. Venous Reflux:  None. Other Findings:  None. IMPRESSION: No evidence of LEFT lower extremity deep venous thrombosis. Electronically Signed   By: Awilda Metro M.D.   On: 06/14/2016 06:27    Micro Results    Recent Results (from the past 240 hour(s))  Blood culture (single)     Status: Abnormal   Collection Time: 06/14/16  5:17 AM  Result Value Ref Range Status   Specimen Description BLOOD RIGHT ARM  Final   Special Requests   Final    BOTTLES DRAWN AEROBIC AND ANAEROBIC ANA12ML AER11ML   Culture  Setup Time   Final    GRAM POSITIVE COCCI ANAEROBIC BOTTLE ONLY CRITICAL RESULT CALLED TO, READ BACK BY AND VERIFIED WITH: JASON ROBBINS AT 1951 ON 06/14/2016 JLJ Performed at [facility], [address] [place], [place] [address]    Culture GROUP B STREP(S.AGALACTIAE)ISOLATED (A)  Final   Report Status 06/17/2016 FINAL  Final   Organism ID, Bacteria GROUP B STREP(S.AGALACTIAE)ISOLATED  Final      Susceptibility   Group b strep(s.agalactiae)isolated - MIC*    CLINDAMYCIN <=0.25 SENSITIVE Sensitive     AMPICILLIN <=0.25 SENSITIVE Sensitive     ERYTHROMYCIN <=0.12 SENSITIVE Sensitive     VANCOMYCIN 0.5 SENSITIVE Sensitive     CEFTRIAXONE <=0.12 SENSITIVE Sensitive     LEVOFLOXACIN 1 SENSITIVE Sensitive     * GROUP B STREP(S.AGALACTIAE)ISOLATED  Blood Culture ID Panel (Reflexed)     Status: Abnormal   Collection Time: 06/14/16  5:17 AM  Result Value Ref Range Status   Enterococcus species NOT DETECTED NOT DETECTED Final   Listeria monocytogenes NOT DETECTED NOT DETECTED Final   Staphylococcus species NOT DETECTED NOT DETECTED Final   Staphylococcus aureus NOT DETECTED NOT DETECTED Final   Streptococcus species DETECTED (A) NOT DETECTED Final    Comment: CRITICAL RESULT CALLED TO, READ BACK BY AND VERIFIED WITH: JASON ROBBINS AT 1951 ON 06/14/2016 JLJ     Streptococcus agalactiae DETECTED (A) NOT DETECTED Final    Comment: CRITICAL RESULT CALLED TO, READ BACK BY AND VERIFIED WITH: JASON ROBBINS AT 1951 ON 06/14/2016 JLJ    Streptococcus pneumoniae NOT DETECTED NOT DETECTED Final   Streptococcus pyogenes NOT DETECTED NOT DETECTED Final   Acinetobacter baumannii NOT DETECTED NOT DETECTED Final   Enterobacteriaceae species NOT DETECTED NOT DETECTED Final   Enterobacter cloacae complex NOT DETECTED NOT DETECTED Final   Escherichia coli NOT DETECTED NOT DETECTED Final   Klebsiella oxytoca NOT DETECTED NOT DETECTED Final   Klebsiella pneumoniae NOT DETECTED NOT DETECTED Final   Proteus species NOT DETECTED NOT DETECTED Final   Serratia marcescens NOT DETECTED NOT DETECTED Final   Haemophilus influenzae NOT DETECTED NOT DETECTED Final   Neisseria meningitidis NOT DETECTED NOT DETECTED Final   Pseudomonas aeruginosa NOT DETECTED NOT DETECTED Final   Candida albicans NOT DETECTED NOT DETECTED Final   Candida glabrata NOT DETECTED NOT DETECTED Final   Candida krusei NOT DETECTED NOT DETECTED Final   Candida parapsilosis NOT DETECTED NOT DETECTED Final   Candida tropicalis NOT DETECTED NOT DETECTED Final  Blood culture (routine x 2)     Status: None (Preliminary result)   Collection Time: 06/18/16  4:38 PM  Result Value Ref Range Status   Specimen Description BLOOD RIGHT HAND  Final   Special Requests BOTTLES DRAWN AEROBIC AND ANAEROBIC BCAV  Final   Culture NO GROWTH 2 DAYS  Final   Report Status PENDING  Incomplete  Blood culture (routine x 2)     Status: None (Preliminary result)   Collection Time: 06/18/16  4:38 PM  Result Value Ref Range Status   Specimen Description BLOOD LEFT AC  Final   Special Requests BOTTLES DRAWN AEROBIC AND ANAEROBIC BCAV  Final   Culture NO GROWTH 2 DAYS  Final   Report Status PENDING  Incomplete       Today   Subjective:   Kyle Hood today has no headache,no chest abdominal pain,no new weakness  tingling or numbness, feels much better wants to go home today.   Objective:   Blood pressure 140/83, pulse 72, temperature 97.9 F (36.6 C), temperature source Oral, resp. rate 18, height 6' (1.829 m), weight 111.6 kg (246 lb), SpO2 99 %.   Intake/Output Summary (Last 24 hours) at 06/20/16 1312 Last data filed at 06/20/16 1005  Gross per 24 hour  Intake              920 ml  Output                0 ml  Net              920 ml    Exam Awake Alert, Oriented x 3, No new F.N deficits, Normal affect [place].AT,PERRAL Supple Neck,No JVD, No cervical lymphadenopathy appriciated.  Symmetrical Chest wall movement, Good air movement bilaterally, CTAB RRR,No Gallops,Rubs or new Murmurs, No Parasternal Heave +ve B.Sounds, Abd Soft, Non tender, No organomegaly appriciated, No rebound -guarding or rigidity. No Cyanosis, Clubbing  or edema, No new Rash or bruise  Data Review   CBC w Diff: Lab Results  Component Value Date   WBC 8.7 06/19/2016   HGB 14.6 06/19/2016   HCT 41.5 06/19/2016   PLT 309 06/19/2016   LYMPHOPCT 32 06/18/2016   MONOPCT 6 06/18/2016   EOSPCT 2 06/18/2016   BASOPCT 1 06/18/2016    CMP: Lab Results  Component Value Date   NA 139 06/19/2016   NA 137 08/16/2014   K 4.3 06/19/2016   CL 108 06/19/2016   CO2 24 06/19/2016   BUN 11 06/19/2016   BUN 11 08/16/2014   CREATININE 0.97 06/19/2016   GLU 168 08/16/2014   PROT 7.8 06/14/2016   ALBUMIN 4.6 06/14/2016   BILITOT 0.8 06/14/2016   ALKPHOS 77 06/14/2016   AST 37 06/14/2016   ALT 33 06/14/2016  .   Total Time in preparing paper work, data evaluation and todays exam - 35 minutes  Burnis Halling M.D on 06/20/2016 at 1:12 PM    Note: This dictation was prepared with Dragon dictation along with smaller phrase technology. Any transcriptional errors that result from this process are unintentional.

## 2016-06-23 LAB — CULTURE, BLOOD (ROUTINE X 2)
CULTURE: NO GROWTH
Culture: NO GROWTH

## 2016-07-01 ENCOUNTER — Ambulatory Visit (INDEPENDENT_AMBULATORY_CARE_PROVIDER_SITE_OTHER): Payer: Medicaid Other | Admitting: Vascular Surgery

## 2016-07-01 ENCOUNTER — Ambulatory Visit (INDEPENDENT_AMBULATORY_CARE_PROVIDER_SITE_OTHER): Payer: Medicaid Other

## 2016-07-01 ENCOUNTER — Encounter (INDEPENDENT_AMBULATORY_CARE_PROVIDER_SITE_OTHER): Payer: Self-pay | Admitting: Vascular Surgery

## 2016-07-01 VITALS — BP 135/82 | HR 70 | Resp 17 | Wt 260.6 lb

## 2016-07-01 DIAGNOSIS — E785 Hyperlipidemia, unspecified: Secondary | ICD-10-CM | POA: Diagnosis not present

## 2016-07-01 DIAGNOSIS — M159 Polyosteoarthritis, unspecified: Secondary | ICD-10-CM

## 2016-07-01 DIAGNOSIS — M79604 Pain in right leg: Secondary | ICD-10-CM | POA: Diagnosis not present

## 2016-07-01 DIAGNOSIS — M150 Primary generalized (osteo)arthritis: Secondary | ICD-10-CM | POA: Diagnosis not present

## 2016-07-01 DIAGNOSIS — I739 Peripheral vascular disease, unspecified: Secondary | ICD-10-CM | POA: Diagnosis not present

## 2016-07-01 DIAGNOSIS — R600 Localized edema: Secondary | ICD-10-CM

## 2016-07-01 DIAGNOSIS — M79605 Pain in left leg: Secondary | ICD-10-CM

## 2016-07-03 DIAGNOSIS — M79609 Pain in unspecified limb: Secondary | ICD-10-CM | POA: Insufficient documentation

## 2016-07-03 DIAGNOSIS — M1990 Unspecified osteoarthritis, unspecified site: Secondary | ICD-10-CM | POA: Insufficient documentation

## 2016-07-03 NOTE — Progress Notes (Signed)
MRN : [id number]  Kyle Hood is a 61 y.o. (June 14, 1955) male who presents with chief complaint of  Chief Complaint  Patient presents with  . Follow-up  .  History of Present Illness: The patient returns to the office for followup evaluation regarding leg pain and swelling.  The swelling has persisted and the pain associated with swelling continues in spite of conservative measures. There have not been any interval development of a ulcerations or wounds.  Since the previous visit the patient has been wearing graduated compression stockings and has noted little if any improvement in the lymphedema. The patient has been using compression routinely morning until night.  The patient also states elevation during the day and exercise is being done too.   Current Meds  Medication Sig  . aspirin 325 MG EC tablet Take 325 mg by mouth daily.  Marland Kitchen buPROPion (WELLBUTRIN XL) 150 MG 24 hr tablet Take 150 mg by mouth every morning.  . hydrochlorothiazide (HYDRODIURIL) 25 MG tablet Take 25 mg by mouth daily.  Marland Kitchen levofloxacin (LEVAQUIN) 750 MG tablet Take 1 tablet (750 mg total) by mouth daily.  . naproxen (NAPROSYN) 500 MG tablet Take by mouth.  . oxyCODONE-acetaminophen (ROXICET) 5-325 MG tablet Take 1 tablet by mouth every 4 (four) hours as needed for severe pain.    Past Medical History:  Diagnosis Date  . Anxiety   . Arthritis   . Depression   . Fatty liver   . Hepatitis B   . Hypertension   . Panic attacks     Past Surgical History:  Procedure Laterality Date  . PROSTATE BIOPSY      Social History Social History  Substance Use Topics  . Smoking status: Never Smoker  . Smokeless tobacco: Never Used  . Alcohol use Yes     Comment: Occasional wine     Family History Family History  Problem Relation Age of Onset  . Cancer Mother     Pancreatic  . Diabetes Mother   . Hypertension Mother   . Varicose Veins Mother   . Heart disease Father     CABG  . Cancer Father    Jaw  . Aortic aneurysm Father   . Congestive Heart Failure Father   . Hypertension Father   . Varicose Veins Father   No family history of bleeding/clotting disorders, porphyria or autoimmune disease   Allergies  Allergen Reactions  . Lisinopril Cough     REVIEW OF SYSTEMS (Negative unless checked)  Constitutional: [] Weight loss  [] Fever  [] Chills Cardiac: [] Chest pain   [] Chest pressure   [] Palpitations   [] Shortness of breath when laying flat   [] Shortness of breath with exertion. Vascular:  [] Pain in legs with walking   [] Pain in legs at rest  [] History of DVT   [] Phlebitis   [] Swelling in legs   [] Varicose veins   [] Non-healing ulcers Pulmonary:   [] Uses home oxygen   [] Productive cough   [] Hemoptysis   [] Wheeze  [] COPD   [] Asthma Neurologic:  [] Dizziness   [] Seizures   [] History of stroke   [] History of TIA  [] Aphasia   [] Vissual changes   [] Weakness or numbness in arm   [] Weakness or numbness in leg Musculoskeletal:   [] Joint swelling   [] Joint pain   [] Low back pain Hematologic:  [] Easy bruising  [] Easy bleeding   [] Hypercoagulable state   [] Anemic Gastrointestinal:  [] Diarrhea   [] Vomiting  [] Gastroesophageal reflux/heartburn   [] Difficulty swallowing. Genitourinary:  [] Chronic kidney disease   []   Difficult urination  [] Frequent urination   [] Blood in urine Skin:  [] Rashes   [] Ulcers  Psychological:  [] History of anxiety   []  History of major depression.  Physical Examination  Vitals:   07/01/16 1412  BP: 135/82  Pulse: 70  Resp: 17  Weight: 118.2 kg (260 lb 9.6 oz)   Body mass index is 35.34 kg/m. Gen: WD/WN, NAD Head: [place]/AT, No temporalis wasting.  Ear/Nose/Throat: Hearing grossly intact, nares w/o erythema or drainage, poor dentition Eyes: PER, EOMI, sclera nonicteric.  Neck: Supple, no masses.  No bruit or JVD.  Pulmonary:  Good air movement, clear to auscultation bilaterally, no use of accessory muscles.  Cardiac: RRR, normal S1, S2, no Murmurs. Vascular:   Moderate edema bilaterally with associated venous stasis changes with diffuse small varicose veins Vessel Right Left  Radial Palpable Palpable  Ulnar Palpable Palpable  Brachial Palpable Palpable  Carotid Palpable Palpable  Femoral Palpable Palpable  Popliteal Not Palpable Not Palpable  PT Trace Palpable Trace Palpable  DP Trace Palpable Trace Palpable   Gastrointestinal: soft, non-distended. No guarding/no peritoneal signs.  Musculoskeletal: M/S 5/5 throughout.  No deformity or atrophy.  Neurologic: CN 2-12 intact. Pain and light touch intact in extremities.  Symmetrical.  Speech is fluent. Motor exam as listed above. Psychiatric: Judgment intact, Mood & affect appropriate for pt's clinical situation. Dermatologic: No rashes or ulcers noted.  No changes consistent with cellulitis. Lymph : No Cervical lymphadenopathy, no lichenification or skin changes of chronic lymphedema.  CBC Lab Results  Component Value Date   WBC 8.7 06/19/2016   HGB 14.6 06/19/2016   HCT 41.5 06/19/2016   MCV 96.3 06/19/2016   PLT 309 06/19/2016    BMET    Component Value Date/Time   NA 139 06/19/2016 0453   NA 137 08/16/2014   K 4.3 06/19/2016 0453   CL 108 06/19/2016 0453   CO2 24 06/19/2016 0453   GLUCOSE 123 (H) 06/19/2016 0453   BUN 11 06/19/2016 0453   BUN 11 08/16/2014   CREATININE 0.97 06/19/2016 0453   CALCIUM 8.8 (L) 06/19/2016 0453   GFRNONAA >60 06/19/2016 0453   GFRAA >60 06/19/2016 0453   Estimated Creatinine Clearance (by C-G formula based on SCr of 0.97 mg/dL) Male: 16.1 mL/min Male: 108.8 mL/min  COAG Lab Results  Component Value Date   INR 0.96 12/30/2015    Radiology US Abdomen Complete  Result Date: 06/19/2016 CLINICAL DATA:  Abdominal pain. EXAM: ABDOMEN ULTRASOUND COMPLETE COMPARISON:  No recent prior . FINDINGS: Gallbladder: No gallstones or wall thickening visualized. No sonographic [name] sign noted by sonographer. Common bile duct: Diameter: 4.0 mm Liver:  Liver is echogenic consistent fatty infiltrate disease. IVC: No abnormality visualized. Pancreas: Visualized portion unremarkable. Spleen: Size and appearance within normal limits. Right Kidney: Length: 11.2 cm. Echogenicity within normal limits. No mass or hydronephrosis visualized. Left Kidney: Length: 11.6 cm. Echogenicity within normal limits. No mass or hydronephrosis visualized. Abdominal aorta: No aneurysm visualized. Other findings: None. IMPRESSION: Liver is echogenic consistent fatty infiltration and/or hepatocellular disease. No acute or focal abnormality otherwise noted. Electronically Signed   By: Maisie Fus  Register   On: 06/19/2016 09:03   US Venous Img Lower Unilateral Left  Result Date: 06/14/2016 CLINICAL DATA:  Pain and swelling, cellulitis for several months. EXAM: LEFT LOWER EXTREMITY VENOUS DOPPLER ULTRASOUND TECHNIQUE: Gray-scale sonography with graded compression, as well as color Doppler and duplex ultrasound were performed to evaluate the lower extremity deep venous systems from the level of the common  femoral vein and including the common femoral, femoral, profunda femoral, popliteal and calf veins including the posterior tibial, peroneal and gastrocnemius veins when visible. The superficial great saphenous vein was also interrogated. Spectral Doppler was utilized to evaluate flow at rest and with distal augmentation maneuvers in the common femoral, femoral and popliteal veins. COMPARISON:  None. FINDINGS: Contralateral Common Femoral Vein: Respiratory phasicity is normal and symmetric with the symptomatic side. No evidence of thrombus. Normal compressibility. Common Femoral Vein: No evidence of thrombus. Normal compressibility, respiratory phasicity and response to augmentation. Saphenofemoral Junction: No evidence of thrombus. Normal compressibility and flow on color Doppler imaging. Profunda Femoral Vein: No evidence of thrombus. Normal compressibility and flow on color Doppler  imaging. Femoral Vein: No evidence of thrombus. Normal compressibility, respiratory phasicity and response to augmentation. Popliteal Vein: No evidence of thrombus. Normal compressibility, respiratory phasicity and response to augmentation. Calf Veins: No evidence of thrombus. Normal compressibility and flow on color Doppler imaging. Superficial Great Saphenous Vein: No evidence of thrombus. Normal compressibility and flow on color Doppler imaging. Venous Reflux:  None. Other Findings:  None. IMPRESSION: No evidence of LEFT lower extremity deep venous thrombosis. Electronically Signed   By: Awilda Metro M.D.   On: 06/14/2016 06:27    Assessment/Plan 1. Pain in both lower extremities Recommend:  I do not find evidence of Vascular pathology that would explain the patient's symptoms  The patient has atypical pain symptoms for vascular disease  Noninvasive studies including venous ultrasound of the legs do not identify vascular problems  The patient should continue walking and begin a more formal exercise program. The patient should continue his antiplatelet therapy and aggressive treatment of the lipid abnormalities. The patient should begin wearing graduated compression socks 15-20 mmHg strength to control her mild edema.  Patient will follow-up with me on a PRN basis  Further work-up of her lower extremity pain is deferred to the primary service     2. Lower extremity edema No surgery or intervention at this point in time.    I have had a long discussion with the patient regarding venous insufficiency and why it  causes symptoms. I have discussed with the patient the chronic skin changes that accompany venous insufficiency and the long term sequela such as infection and ulceration.  Patient will begin wearing graduated compression stockings class 1 (20-30 mmHg) or compression wraps on a daily basis a prescription was given. The patient will put the stockings on first thing in the morning  and removing them in the evening. The patient is instructed specifically not to sleep in the stockings.    In addition, behavioral modification including several periods of elevation of the lower extremities during the day will be continued. I have demonstrated that proper elevation is a position with the ankles at heart level.  The patient is instructed to begin routine exercise, especially walking on a daily basis  Patient's duplex ultrasound of the venous system is negative for DVT or reflux disease.  Following the review of the ultrasound the patient will follow up in 2-3 months to reassess the degree of swelling and the control that graduated compression stockings or compression wraps  is offering.   The patient can be assessed for a Lymph Pump at that time  3. Primary osteoarthritis involving multiple joints Continue NSAID medications as already ordered, these medications have been reviewed and there are no changes at this time.  4. Hyperlipidemia, unspecified hyperlipidemia type Continue statin as ordered and reviewed, no changes  at this time   Levora Dredge, MD  07/03/2016 10:46 AM

## 2016-08-22 ENCOUNTER — Other Ambulatory Visit: Payer: Self-pay | Admitting: Family Medicine

## 2016-08-22 DIAGNOSIS — M545 Low back pain: Secondary | ICD-10-CM

## 2016-09-05 ENCOUNTER — Encounter: Payer: Self-pay | Admitting: Radiology

## 2016-09-05 ENCOUNTER — Ambulatory Visit
Admission: RE | Admit: 2016-09-05 | Discharge: 2016-09-05 | Disposition: A | Discharge status: Home / Self Care | Payer: Medicare Other | Source: Ambulatory Visit | Attending: Family Medicine | Admitting: Family Medicine

## 2016-09-05 DIAGNOSIS — M5126 Other intervertebral disc displacement, lumbar region: Secondary | ICD-10-CM | POA: Insufficient documentation

## 2016-09-05 DIAGNOSIS — M79604 Pain in right leg: Secondary | ICD-10-CM | POA: Diagnosis not present

## 2016-09-05 DIAGNOSIS — M79605 Pain in left leg: Secondary | ICD-10-CM | POA: Diagnosis not present

## 2016-09-05 DIAGNOSIS — M545 Low back pain: Secondary | ICD-10-CM | POA: Diagnosis present

## 2016-12-18 DIAGNOSIS — M5416 Radiculopathy, lumbar region: Secondary | ICD-10-CM | POA: Insufficient documentation

## 2016-12-18 DIAGNOSIS — M545 Low back pain, unspecified: Secondary | ICD-10-CM | POA: Insufficient documentation

## 2016-12-18 DIAGNOSIS — G629 Polyneuropathy, unspecified: Secondary | ICD-10-CM | POA: Insufficient documentation

## 2016-12-18 DIAGNOSIS — G8929 Other chronic pain: Secondary | ICD-10-CM | POA: Insufficient documentation

## 2017-01-20 ENCOUNTER — Telehealth: Payer: Self-pay | Admitting: *Deleted

## 2017-01-21 ENCOUNTER — Ambulatory Visit
Payer: Medicare Other | Attending: Student in an Organized Health Care Education/Training Program | Admitting: Student in an Organized Health Care Education/Training Program

## 2017-01-21 ENCOUNTER — Encounter: Payer: Self-pay | Admitting: Student in an Organized Health Care Education/Training Program

## 2017-01-21 VITALS — BP 175/78 | HR 72 | Temp 98.4°F | Resp 16 | Ht 74.0 in | Wt 260.0 lb

## 2017-01-21 DIAGNOSIS — M4696 Unspecified inflammatory spondylopathy, lumbar region: Secondary | ICD-10-CM

## 2017-01-21 DIAGNOSIS — M150 Primary generalized (osteo)arthritis: Secondary | ICD-10-CM

## 2017-01-21 DIAGNOSIS — M1991 Primary osteoarthritis, unspecified site: Secondary | ICD-10-CM | POA: Diagnosis not present

## 2017-01-21 DIAGNOSIS — M9983 Other biomechanical lesions of lumbar region: Secondary | ICD-10-CM | POA: Diagnosis not present

## 2017-01-21 DIAGNOSIS — L03116 Cellulitis of left lower limb: Secondary | ICD-10-CM | POA: Diagnosis not present

## 2017-01-21 DIAGNOSIS — M5136 Other intervertebral disc degeneration, lumbar region: Secondary | ICD-10-CM | POA: Insufficient documentation

## 2017-01-21 DIAGNOSIS — M47816 Spondylosis without myelopathy or radiculopathy, lumbar region: Secondary | ICD-10-CM | POA: Insufficient documentation

## 2017-01-21 DIAGNOSIS — F329 Major depressive disorder, single episode, unspecified: Secondary | ICD-10-CM | POA: Diagnosis not present

## 2017-01-21 DIAGNOSIS — B1910 Unspecified viral hepatitis B without hepatic coma: Secondary | ICD-10-CM | POA: Diagnosis not present

## 2017-01-21 DIAGNOSIS — S39012S Strain of muscle, fascia and tendon of lower back, sequela: Secondary | ICD-10-CM | POA: Diagnosis not present

## 2017-01-21 DIAGNOSIS — E785 Hyperlipidemia, unspecified: Secondary | ICD-10-CM | POA: Insufficient documentation

## 2017-01-21 DIAGNOSIS — M545 Low back pain: Secondary | ICD-10-CM | POA: Diagnosis present

## 2017-01-21 DIAGNOSIS — S39012A Strain of muscle, fascia and tendon of lower back, initial encounter: Secondary | ICD-10-CM | POA: Diagnosis not present

## 2017-01-21 DIAGNOSIS — M159 Polyosteoarthritis, unspecified: Secondary | ICD-10-CM

## 2017-01-21 DIAGNOSIS — M48061 Spinal stenosis, lumbar region without neurogenic claudication: Secondary | ICD-10-CM | POA: Insufficient documentation

## 2017-01-21 DIAGNOSIS — M7662 Achilles tendinitis, left leg: Secondary | ICD-10-CM | POA: Insufficient documentation

## 2017-01-21 DIAGNOSIS — X58XXXA Exposure to other specified factors, initial encounter: Secondary | ICD-10-CM | POA: Insufficient documentation

## 2017-01-21 DIAGNOSIS — I129 Hypertensive chronic kidney disease with stage 1 through stage 4 chronic kidney disease, or unspecified chronic kidney disease: Secondary | ICD-10-CM | POA: Insufficient documentation

## 2017-01-21 DIAGNOSIS — N189 Chronic kidney disease, unspecified: Secondary | ICD-10-CM | POA: Diagnosis not present

## 2017-01-21 DIAGNOSIS — F419 Anxiety disorder, unspecified: Secondary | ICD-10-CM | POA: Insufficient documentation

## 2017-01-21 DIAGNOSIS — G8929 Other chronic pain: Secondary | ICD-10-CM | POA: Insufficient documentation

## 2017-01-21 MED ORDER — DICLOFENAC SODIUM 1 % TD GEL: 4.0000 g | Freq: Four times a day (QID) | TRANSDERMAL | 2 refills | Status: AC

## 2017-01-21 NOTE — Progress Notes (Signed)
Safety precautions to be maintained throughout the outpatient stay will include: orient to surroundings, keep bed in low position, maintain call bell within reach at all times, provide assistance with transfer out of bed and ambulation.  

## 2017-01-21 NOTE — Patient Instructions (Addendum)
1. Schedule for bilateral lumbar facets blocks at L3-L5 with sedation.  2. Voltaren gel to ankle as needed  3. Follow up for procedure.   Facet Blocks Patient Information  Description: The facets are joints in the spine between the vertebrae.  Like any joints in the body, facets can become irritated and painful.  Arthritis can also effect the facets.  By injecting steroids and local anesthetic in and around these joints, we can temporarily block the nerve supply to them.  Steroids act directly on irritated nerves and tissues to reduce selling and inflammation which often leads to decreased pain.  Facet blocks may be done anywhere along the spine from the neck to the low back depending upon the location of your pain.   After numbing the skin with local anesthetic (like Novocaine), a small needle is passed onto the facet joints under x-ray guidance.  You may experience a sensation of pressure while this is being done.  The entire block usually lasts about 15-25 minutes.   Conditions which may be treated by facet blocks:   Low back/buttock pain  Neck/shoulder pain  Certain types of headaches  Preparation for the injection:  1. Do not eat any solid food or dairy products within 8 hours of your appointment. 2. You may drink clear liquid up to 3 hours before appointment.  Clear liquids include water, black coffee, juice or soda.  No milk or cream please. 3. You may take your regular medication, including pain medications, with a sip of water before your appointment.  Diabetics should hold regular insulin (if taken separately) and take 1/2 normal NPH dose the morning of the procedure.  Carry some sugar containing items with you to your appointment. 4. A driver must accompany you and be prepared to drive you home after your procedure. 5. Bring all your current medications with you. 6. An IV may be inserted and sedation may be given at the discretion of the physician. 7. A blood pressure cuff,  EKG and other monitors will often be applied during the procedure.  Some patients may need to have extra oxygen administered for a short period. 8. You will be asked to provide medical information, including your allergies and medications, prior to the procedure.  We must know immediately if you are taking blood thinners (like Coumadin/Warfarin) or if you are allergic to IV iodine contrast (dye).  We must know if you could possible be pregnant.  Possible side-effects:   Bleeding from needle site  Infection (rare, may require surgery)  Nerve injury (rare)  Numbness & tingling (temporary)  Difficulty urinating (rare, temporary)  Spinal headache (a headache worse with upright posture)  Light-headedness (temporary)  Pain at injection site (serveral days)  Decreased blood pressure (rare, temporary)  Weakness in arm/leg (temporary)  Pressure sensation in back/neck (temporary)   Call if you experience:   Fever/chills associated with headache or increased back/neck pain  Headache worsened by an upright position  New onset, weakness or numbness of an extremity below the injection site  Hives or difficulty breathing (go to the emergency room)  Inflammation or drainage at the injection site(s)  Severe back/neck pain greater than usual  New symptoms which are concerning to you  Please note:  Although the local anesthetic injected can often make your back or neck feel good for several hours after the injection, the pain will likely return. It takes 3-7 days for steroids to work.  You may not notice any pain relief for at least  one week.  If effective, we will often do a series of 2-3 injections spaced 3-6 weeks apart to maximally decrease your pain.  After the initial series, you may be a candidate for a more permanent nerve block of the facets.  If you have any questions, please call #[phone] [facility]

## 2017-01-21 NOTE — Progress Notes (Signed)
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 Tone/Strength: Functionally intact. No obvious neuro-muscular anomalies detected. Sensory (Neurological): Unimpaired Palpation: No palpable anomalies              Upper Extremity (UE) Exam    Side: Right upper extremity  Side: Left upper extremity  Skin & Extremity Inspection: Skin color, temperature, and hair growth are WNL. No peripheral edema or cyanosis. No masses, redness, swelling, asymmetry, or associated skin lesions. No contractures.  Skin & Extremity Inspection: Skin color, temperature, and hair growth are WNL. No peripheral edema or cyanosis. No masses, redness, swelling, asymmetry, or associated skin lesions. No contractures.  Functional ROM: Unrestricted ROM          Functional ROM: Unrestricted ROM          Muscle Tone/Strength: Functionally intact. No obvious neuro-muscular anomalies detected.  Muscle Tone/Strength:  Functionally intact. No obvious neuro-muscular anomalies detected.  Sensory (Neurological): Unimpaired          Sensory (Neurological): Unimpaired          Palpation: No palpable anomalies  Palpation: No palpable anomalies              Specialized Test(s): Deferred         Specialized Test(s): Deferred          Thoracic Spine Area Exam  Skin & Axial Inspection: No masses, redness, or swelling Alignment: Symmetrical Functional ROM: Unrestricted ROM Stability: No instability detected Muscle Tone/Strength: Functionally intact. No obvious neuro-muscular anomalies detected. Sensory (Neurological): Unimpaired Muscle strength & Tone: No palpable anomalies  Lumbar Spine Area Exam  Skin & Axial Inspection: No masses, redness, or swelling Alignment: Symmetrical Functional ROM: Unrestricted ROM      Stability: No instability detected Muscle Tone/Strength: Functionally intact. No obvious neuro-muscular anomalies detected. Sensory (Neurological): Articular pain pattern Palpation: Complains of area being tender to palpation Bilateral Fist Percussion Test Provocative Tests: Lumbar Hyperextension and rotation test: Positive bilaterally for facet joint pain. Lumbar Lateral bending test: Positive ipsilateral radicular pain, bilaterally. Positive for bilateral foraminal stenosis. Patrick's Maneuver: evaluation deferred today                    Gait & Posture Assessment  Ambulation: Unassisted Gait: Relatively normal for age and body habitus Posture: WNL   Lower Extremity Exam    Side: Right lower extremity  Side: Left lower extremity  Skin & Extremity Inspection: Skin color, temperature, and hair growth are WNL. No peripheral edema or cyanosis. No masses, redness, swelling, asymmetry, or associated skin lesions. No contractures.  Skin & Extremity Inspection: Skin color, temperature, and hair growth are WNL. No peripheral edema or cyanosis. No masses, redness, swelling, asymmetry, or  associated skin lesions. No contractures.  Functional ROM: Unrestricted ROM          Functional ROM: Unrestricted ROM          Muscle Tone/Strength: Functionally intact. No obvious neuro-muscular anomalies detected.  Muscle Tone/Strength: Functionally intact. No obvious neuro-muscular anomalies detected.  Sensory (Neurological): Unimpaired  Sensory (Neurological): Unimpaired  Palpation: No palpable anomalies  Palpation: No palpable anomalies   Assessment  Primary Diagnosis & Pertinent Problem List: The primary encounter diagnosis was Spondylosis without myelopathy or radiculopathy, lumbar region. Diagnoses of Primary osteoarthritis involving multiple joints, Lumbar degenerative disc disease, Lumbar facet arthropathy (HCC), Neuroforaminal stenosis of lumbar spine, and Strain of lumbar region, sequela were also pertinent to this visit.  Visit Diagnosis (New problems to examiner): 1. Spondylosis without myelopathy or radiculopathy, lumbar region   2. Primary osteoarthritis involving multiple joints   3. Lumbar degenerative disc disease   4. Lumbar facet arthropathy (HCC)   5. Neuroforaminal stenosis of lumbar spine   6. Strain of lumbar region, sequela    Plan of Care (Initial workup plan)   61 year old gentleman with a history of hypertension, hyperlipidemia, history of cellulitis and left anterior leg who presents withaxial low back pain with radiation to bilateral legs for many years which has worsened over the last 2-3 years. Patient describes pain as aching and throbbing which is remains in his lower back and buttock region. It rarely extends below his knees. Patient also deals with Achilles tendinosis which which results in bilateral ankle pain as well.  Patient has not tried any interventional therapies including lumbar facet blocks or epidural steroid injections. He only takes naproxen 500 mg twice a day for his low back. Patient has had nerve conduction studies performed along with EMG  which showed chronic lumbar polyradiculopathy along with severe lower extremity sensory polyneuropathy.  Patient's axial low back pain with radiation  to bilateral buttocks and posterior thighs to be secondary to lumbar spondylosis as upported by lumbar MRI and physical exam findings of pain with lumbar extension and lateral rotation as well as pain overlying the lumbar facets to deep palpation. Patient states that he has tried to do physical therapy exercises at home that he found online was unable to do them secondary to pain.   We discussed diagnostic lumbar facet blocks at L3/L4, L4/L5, L5/S1 bilaterally with goal radiofrequency ablation if effective. If not effective we can consider hip pathology and obtain diagnostic imaging of hip joints.  In regards to medication management, patient is not interested in opioid therapy and we will manage his pain through non-opioid analgesics as well as interventional modalities.  Plan: 1. Diagnostic bilateral lumbar facet blocks at L3/L4, L4/L5, L5/S1 with goal radiofrequency ablation effective 2. Prescription for Voltaren gel that the patient can apply to his left ankle for Achilles tendinosis.   Ordered Lab-work, Procedure(s), Referral(s), & Consult(s): Orders Placed This Encounter  Procedures  . LUMBAR FACET(MEDIAL BRANCH NERVE BLOCK) MBNB   Pharmacotherapy (current): Medications ordered:  Meds ordered this encounter  Medications  . diclofenac sodium (VOLTAREN) 1 % GEL    Sig: Apply 4 g topically 4 (four) times daily.    Dispense:  100 g    Refill:  2    Do not add this medication to the electronic "Automatic Refill" notification system. Patient may have prescription filled one day early if pharmacy is closed on scheduled refill date.   Medications administered during this visit: Mr. Vires had no medications administered during this visit.   Pharmacological management options:  Opioid Analgesics: The patient was informed that there is no  guarantee that he would be a candidate for opioid analgesics. The decision will be made following CDC guidelines. This decision will be based on the results of diagnostic studies, as well as Mr. Tursi's risk profile.   Membrane stabilizer: To be determined at a later time  Muscle relaxant: To be determined at a later time  NSAID: To be determined at a later time  Other analgesic(s): To be determined at a later time   Interventional management options: Mr. Bedsworth was informed that there is no guarantee that he would be a candidate for interventional therapies. The decision will be based on the results of diagnostic studies, as well as Mr. Wax's risk profile.  Procedure(s) under consideration:  -bilateral lumbar facets with goal radiofrequency ablation -Bilateral SI joint injections(consider imaging of SI joint and hips)   Provider-requested follow-up: Return for Procedure.  Future Appointments Date Time Provider Department Center  01/29/2017 9:45 AM Kyle Jolly, MD [facility] None    Primary Care Physician: Kyle Fleeting, MD Location: [facility] Note by: Kyle Hood, M.D, Date: 01/21/2017; Time: 1:53 PM  Patient Instructions  1. Schedule for bilateral lumbar facets blocks at L3-L5 with sedation.  2. Voltaren gel to ankle as needed  3. Follow up for procedure.   Facet Blocks Patient Information  Description: The facets are joints in the spine between the vertebrae.  Like any joints in the body, facets can become irritated and painful.  Arthritis can also effect the facets.  By injecting steroids and local anesthetic in and around these joints, we can temporarily block the nerve supply to them.  Steroids act directly on irritated nerves and tissues to reduce selling and inflammation which often leads to decreased pain.  Facet blocks may be done anywhere along the spine from  the neck to the low back depending upon the location of your  pain.   After numbing the skin with local anesthetic (like Novocaine), a small needle is passed onto the facet joints under x-ray guidance.  You may experience a sensation of pressure while this is being done.  The entire block usually lasts about 15-25 minutes.   Conditions which may be treated by facet blocks:   Low back/buttock pain  Neck/shoulder pain  Certain types of headaches  Preparation for the injection:  1. Do not eat any solid food or dairy products within 8 hours of your appointment. 2. You may drink clear liquid up to 3 hours before appointment.  Clear liquids include water, black coffee, juice or soda.  No milk or cream please. 3. You may take your regular medication, including pain medications, with a sip of water before your appointment.  Diabetics should hold regular insulin (if taken separately) and take 1/2 normal NPH dose the morning of the procedure.  Carry some sugar containing items with you to your appointment. 4. A driver must accompany you and be prepared to drive you home after your procedure. 5. Bring all your current medications with you. 6. Hood IV may be inserted and sedation may be given at the discretion of the physician. 7. A blood pressure cuff, EKG and other monitors will often be applied during the procedure.  Some patients may need to have extra oxygen administered for a short period. 8. You will be asked to provide medical information, including your allergies and medications, prior to the procedure.  We must know immediately if you are taking blood thinners (like Coumadin/Warfarin) or if you are allergic to IV iodine contrast (dye).  We must know if you could possible be pregnant.  Possible side-effects:   Bleeding from needle site  Infection (rare, may require surgery)  Nerve injury (rare)  Numbness & tingling (temporary)  Difficulty urinating (rare, temporary)  Spinal headache (a headache worse with upright posture)  Light-headedness  (temporary)  Pain at injection site (serveral days)  Decreased blood pressure (rare, temporary)  Weakness in arm/leg (temporary)  Pressure sensation in back/neck (temporary)   Call if you experience:   Fever/chills associated with headache or increased back/neck pain  Headache worsened by Hood upright position  New onset, weakness or numbness of Hood extremity below the injection site  Hives or difficulty breathing (go to the emergency room)  Inflammation or drainage at the injection site(s)  Severe back/neck pain greater than usual  New symptoms which are concerning to you  Please note:  Although the local anesthetic injected can often make your back or neck feel good for several hours after the injection, the pain will likely return. It takes 3-7 days for steroids to work.  You may not notice any pain relief for at least one week.  If effective, we will often do a series of 2-3 injections spaced 3-6 weeks apart to maximally decrease your pain.  After the initial series, you may be a candidate for a more permanent nerve block of the facets.  If you have any questions, please call #[phone] [facility]

## 2017-01-29 ENCOUNTER — Ambulatory Visit (HOSPITAL_BASED_OUTPATIENT_CLINIC_OR_DEPARTMENT_OTHER): Payer: Medicare Other | Admitting: Student in an Organized Health Care Education/Training Program

## 2017-01-29 ENCOUNTER — Ambulatory Visit
Admission: RE | Admit: 2017-01-29 | Discharge: 2017-01-29 | Disposition: A | Discharge status: Home / Self Care | Payer: Medicare Other | Source: Ambulatory Visit | Attending: Student in an Organized Health Care Education/Training Program | Admitting: Student in an Organized Health Care Education/Training Program

## 2017-01-29 ENCOUNTER — Encounter: Payer: Self-pay | Admitting: Student in an Organized Health Care Education/Training Program

## 2017-01-29 VITALS — BP 125/82 | HR 71 | Temp 98.0°F | Resp 18 | Ht 72.0 in | Wt 260.0 lb

## 2017-01-29 DIAGNOSIS — M5136 Other intervertebral disc degeneration, lumbar region: Secondary | ICD-10-CM | POA: Insufficient documentation

## 2017-01-29 DIAGNOSIS — M48061 Spinal stenosis, lumbar region without neurogenic claudication: Secondary | ICD-10-CM | POA: Insufficient documentation

## 2017-01-29 DIAGNOSIS — M47816 Spondylosis without myelopathy or radiculopathy, lumbar region: Secondary | ICD-10-CM

## 2017-01-29 DIAGNOSIS — M545 Low back pain: Secondary | ICD-10-CM | POA: Insufficient documentation

## 2017-01-29 DIAGNOSIS — M9983 Other biomechanical lesions of lumbar region: Secondary | ICD-10-CM

## 2017-01-29 MED ORDER — ROPIVACAINE HCL 2 MG/ML IJ SOLN
10.0000 mL | Freq: Once | INTRAMUSCULAR | Status: AC
  Administered 2017-01-29: 10 mL
  Filled 2017-01-29: qty 10

## 2017-01-29 MED ORDER — FENTANYL CITRATE (PF) 100 MCG/2ML IJ SOLN
25.0000 ug | INTRAMUSCULAR | Status: DC | PRN
  Administered 2017-01-29: 50 ug via INTRAVENOUS
  Filled 2017-01-29: qty 2

## 2017-01-29 MED ORDER — LIDOCAINE HCL (PF) 1 % IJ SOLN
10.0000 mL | Freq: Once | INTRAMUSCULAR | Status: AC
  Administered 2017-01-29: 5 mL
  Filled 2017-01-29: qty 10

## 2017-01-29 MED ORDER — LACTATED RINGERS IV SOLN
1000.0000 mL | Freq: Once | INTRAVENOUS | Status: AC
  Administered 2017-01-29: 1000 mL via INTRAVENOUS

## 2017-01-29 NOTE — Patient Instructions (Signed)

## 2017-01-29 NOTE — Progress Notes (Signed)
Patient's Name: Kyle Hood  MRN: [id number]  Referring Provider: Edward Jolly, MD  DOB: [birth year]  PCP: Preston Fleeting, MD  DOS: 01/29/2017  Note by: Edward Jolly, MD  Service setting: Ambulatory outpatient  Specialty: Interventional Pain Management  Patient type: Established  Location: [facility] (AMB) Pain Management Facility  Visit type: Interventional Procedure   Primary Reason for Visit: Interventional Pain Management Treatment. CC: Back Pain (low)  Procedure:  Anesthesia, Analgesia, Anxiolysis:  Type: Diagnostic Medial Branch Facet Block Region: Lumbar Level:  L3, L4, L5, Medial Branch Level(s) Laterality: Bilateral  Type: Local Anesthesia with Moderate (Conscious) Sedation Local Anesthetic: Lidocaine 1% Route: Intravenous (IV) IV Access: Secured Sedation: Meaningful verbal contact was maintained at all times during the procedure  Indication(s): Analgesia and Anxiety   Indications: 1. Spondylosis without myelopathy or radiculopathy, lumbar region   2. Lumbar degenerative disc disease   3. Lumbar facet arthropathy   4. Neuroforaminal stenosis of lumbar spine    Pain Score: Pre-procedure: 7 /10 Post-procedure: 0-No pain/10  Pre-op Assessment:  Kyle Hood is a 61 y.o. (year old), male patient, seen today for interventional treatment. He  has a past surgical history that includes Prostate biopsy. [name] has a current medication list which includes the following prescription(s): clonazepam, hydrochlorothiazide, naproxen, vilazodone hcl, aspirin, bupropion, diclofenac sodium, levofloxacin, and oxycodone-acetaminophen, and the following Facility-Administered Medications: fentanyl. His primarily concern today is the Back Pain (low)  Initial Vital Signs: Blood pressure 125/82, pulse 71, temperature 98 F (36.7 C), resp. rate 18, height 6' (1.829 m), weight 260 lb (117.9 kg), SpO2 97 %. BMI: Estimated body mass index is 35.26 kg/m as calculated from the following:  Height as of this encounter: 6' (1.829 m).   Weight as of this encounter: 260 lb (117.9 kg).  Risk Assessment: Allergies: Reviewed. He is allergic to lisinopril.  Allergy Precautions: None required Coagulopathies: Reviewed. None identified.  Blood-thinner therapy: None at this time Active Infection(s): Reviewed. None identified. Kyle Hood is afebrile  Site Confirmation: Kyle Hood was asked to confirm the procedure and laterality before marking the site Procedure checklist: Completed Consent: Before the procedure and under the influence of no sedative(s), amnesic(s), or anxiolytics, the patient was informed of the treatment options, risks and possible complications. To fulfill our ethical and legal obligations, as recommended by the American Medical Association's Code of Ethics, I have informed the patient of my clinical impression; the nature and purpose of the treatment or procedure; the risks, benefits, and possible complications of the intervention; the alternatives, including doing nothing; the risk(s) and benefit(s) of the alternative treatment(s) or procedure(s); and the risk(s) and benefit(s) of doing nothing. The patient was provided information about the general risks and possible complications associated with the procedure. These may include, but are not limited to: failure to achieve desired goals, infection, bleeding, organ or nerve damage, allergic reactions, paralysis, and death. In addition, the patient was informed of those risks and complications associated to Spine-related procedures, such as failure to decrease pain; infection (i.e.: Meningitis, epidural or intraspinal abscess); bleeding (i.e.: epidural hematoma, subarachnoid hemorrhage, or any other type of intraspinal or peri-dural bleeding); organ or nerve damage (i.e.: Any type of peripheral nerve, nerve root, or spinal cord injury) with subsequent damage to sensory, motor, and/or autonomic systems, resulting in permanent  pain, numbness, and/or weakness of one or several areas of the body; allergic reactions; (i.e.: anaphylactic reaction); and/or death. Furthermore, the patient was informed of those risks and complications associated with the medications. These include,  but are not limited to: allergic reactions (i.e.: anaphylactic or anaphylactoid reaction(s)); adrenal axis suppression; blood sugar elevation that in diabetics may result in ketoacidosis or comma; water retention that in patients with history of congestive heart failure may result in shortness of breath, pulmonary edema, and decompensation with resultant heart failure; weight gain; swelling or edema; medication-induced neural toxicity; particulate matter embolism and blood vessel occlusion with resultant organ, and/or nervous system infarction; and/or aseptic necrosis of one or more joints. Finally, the patient was informed that Medicine is not an exact science; therefore, there is also the possibility of unforeseen or unpredictable risks and/or possible complications that may result in a catastrophic outcome. The patient indicated having understood very clearly. We have given the patient no guarantees and we have made no promises. Enough time was given to the patient to ask questions, all of which were answered to the patient's satisfaction. Kyle Hood has indicated that he wanted to continue with the procedure. Attestation: I, the ordering provider, attest that I have discussed with the patient the benefits, risks, side-effects, alternatives, likelihood of achieving goals, and potential problems during recovery for the procedure that I have provided informed consent. Date: 01/29/2017; Time: 2:27 PM  Pre-Procedure Preparation:  Monitoring: As per clinic protocol. Respiration, ETCO2, SpO2, BP, heart rate and rhythm monitor placed and checked for adequate function Safety Precautions: Patient was assessed for positional comfort and pressure points before  starting the procedure. Time-out: I initiated and conducted the "Time-out" before starting the procedure, as per protocol. The patient was asked to participate by confirming the accuracy of the "Time Out" information. Verification of the correct person, site, and procedure were performed and confirmed by me, the nursing staff, and the patient. "Time-out" conducted as per Joint Commission's Universal Protocol (UP.01.01.01). "Time-out" Date & Time: 01/29/2017; 1057 hrs.  Description of Procedure Process:   Position: Prone Target Area: For Lumbar Facet blocks, the target is the groove formed by the junction of the transverse process and superior articular process. For the L5 dorsal ramus, the target is the notch between superior articular process and sacral ala.  Area Prepped: Entire Posterior Lumbosacral Region Prepping solution: ChloraPrep (2% chlorhexidine gluconate and 70% isopropyl alcohol) Safety Precautions: Aspiration looking for blood return was conducted prior to all injections. At no point did we inject any substances, as a needle was being advanced. No attempts were made at seeking any paresthesias. Safe injection practices and needle disposal techniques used. Medications properly checked for expiration dates. SDV (single dose vial) medications used. Description of the Procedure: Protocol guidelines were followed. The patient was placed in position over the fluoroscopy table. The target area was identified and the area prepped in the usual manner. Skin desensitized using vapocoolant spray. Skin & deeper tissues infiltrated with local anesthetic. Appropriate amount of time allowed to pass for local anesthetics to take effect. The procedure needle was introduced through the skin, ipsilateral to the reported pain, and advanced to the target area. Employing the "Medial Branch Technique", the needles were advanced to the angle made by the superior and medial portion of the transverse process, and the  lateral and inferior portion of the superior articulating process of the targeted vertebral bodies. This area is known as "Burton's Eye" or the "Eye of the [place] Dog". A procedure needle was introduced through the skin, and this time advanced to the angle made by the superior and medial border of the sacral ala, and the lateral border of the S1 vertebral body.  Negative  aspiration confirmed. Solution injected in intermittent fashion, asking for systemic symptoms every 0.5cc of injectate. The needles were then removed and the area cleansed, making sure to leave some of the prepping solution back to take advantage of its long term bactericidal properties.   Illustration of the posterior view of the lumbar spine and the posterior neural structures. Laminae of L2 through S1 are labeled. DPRL5, dorsal primary ramus of L5; DPRS1, dorsal primary ramus of S1; DPR3, dorsal primary ramus of L3; FJ, facet (zygapophyseal) joint L3-L4; I, inferior articular process of L4; LB1, lateral branch of dorsal primary ramus of L1; IAB, inferior articular branches from L3 medial branch (supplies L4-L5 facet joint); IBP, intermediate branch plexus; MB3, medial branch of dorsal primary ramus of L3; NR3, third lumbar nerve root; S, superior articular process of L5; SAB, superior articular branches from L4 (supplies L4-5 facet joint also); TP3, transverse process of L3.  Vitals:   01/29/17 1113 01/29/17 1122 01/29/17 1132 01/29/17 1142  BP: (!) 145/101 (!) 142/85 101/84 125/82  Pulse: 95 71 69 71  Resp: 16 19 16 18   Temp:      SpO2: 98% 99% 98% 97%  Weight:      Height:        Start Time: 1058 hrs. End Time: 1113 hrs. Materials:  Needle(s) Type: Regular needle Gauge: 22G Length: 3.5-in Medication(s): We administered lactated ringers, fentaNYL, lidocaine (PF), and ropivacaine (PF) 2 mg/mL (0.2%). Please see chart orders for dosing details.  Imaging Guidance (Spinal):  Type of Imaging Technique: Fluoroscopy Guidance  (Spinal) Indication(s): Assistance in needle guidance and placement for procedures requiring needle placement in or near specific anatomical locations not easily accessible without such assistance. Exposure Time: Please see nurses notes. Contrast: None used. Fluoroscopic Guidance: I was personally present during the use of fluoroscopy. "Tunnel Vision Technique" used to obtain the best possible view of the target area. Parallax error corrected before commencing the procedure. "Direction-depth-direction" technique used to introduce the needle under continuous pulsed fluoroscopy. Once target was reached, antero-posterior, oblique, and lateral fluoroscopic projection used confirm needle placement in all planes. Images permanently stored in EMR. Interpretation: No contrast injected. I personally interpreted the imaging intraoperatively. Adequate needle placement confirmed in multiple planes. Permanent images saved into the patient's record. 1.5 cc of 0.2% ropivacaine at each level. Antibiotic Prophylaxis:  Indication(s): None identified Antibiotic given: None  Post-operative Assessment:  EBL: None Complications: No immediate post-treatment complications observed by team, or reported by patient. Note: The patient tolerated the entire procedure well. A repeat set of vitals were taken after the procedure and the patient was kept under observation following institutional policy, for this type of procedure. Post-procedural neurological assessment was performed, showing return to baseline, prior to discharge. The patient was provided with post-procedure discharge instructions, including a section on how to identify potential problems. Should any problems arise concerning this procedure, the patient was given instructions to immediately contact us, at any time, without hesitation. In any case, we plan to contact the patient by telephone for a follow-up status report regarding this interventional  procedure. Comments:  No additional relevant information.  Plan of Care   Imaging Orders     DG C-Arm 1-60 Min-No Report    5 out of 5 strength bilateral lower extremity upon discharge: Plantar flexion, dorsiflexion, knee flexion, knee extension.  Medications ordered for procedure: Meds ordered this encounter  Medications  . lactated ringers infusion 1,000 mL  . fentaNYL (SUBLIMAZE) injection 25-50 mcg    Make  sure Narcan is available in the pyxis when using this medication. In the event of respiratory depression (RR< 8/min): Titrate NARCAN (naloxone) in increments of 0.1 to 0.2 mg IV at 2-3 minute intervals, until desired degree of reversal.  . lidocaine (PF) (XYLOCAINE) 1 % injection 10 mL  . ropivacaine (PF) 2 mg/mL (0.2%) (NAROPIN) injection 10 mL   Medications administered: We administered lactated ringers, fentaNYL, lidocaine (PF), and ropivacaine (PF) 2 mg/mL (0.2%).  See the medical record for exact dosing, route, and time of administration.  New Prescriptions   No medications on file   Disposition: Discharge home  Discharge Date & Time: 01/29/2017; 1145 hrs.   Physician-requested Follow-up: Return in about 2 weeks (around 02/12/2017) for Post Procedure Evaluation. Future Appointments Date Time Provider Department Center  02/13/2017 10:30 AM Edward Jolly, MD [facility] None   Primary Care Physician: Preston Fleeting, MD Location: [facility] Note by: Edward Jolly, MD Date: 01/29/2017; Time: 2:29 PM  Disclaimer:  Medicine is not an exact science. The only guarantee in medicine is that nothing is guaranteed. It is important to note that the decision to proceed with this intervention was based on the information collected from the patient. The Data and conclusions were drawn from the patient's questionnaire, the interview, and the physical examination. Because the information was provided in large part by the patient, it cannot be  guaranteed that it has not been purposely or unconsciously manipulated. Every effort has been made to obtain as much relevant data as possible for this evaluation. It is important to note that the conclusions that lead to this procedure are derived in large part from the available data. Always take into account that the treatment will also be dependent on availability of resources and existing treatment guidelines, considered by other Pain Management Practitioners as being common knowledge and practice, at the time of the intervention. For Medico-Legal purposes, it is also important to point out that variation in procedural techniques and pharmacological choices are the acceptable norm. The indications, contraindications, technique, and results of the above procedure should only be interpreted and judged by a Board-Certified Interventional Pain Specialist with extensive familiarity and expertise in the same exact procedure and technique.

## 2017-01-30 ENCOUNTER — Telehealth: Payer: Self-pay | Admitting: *Deleted

## 2017-01-30 NOTE — Telephone Encounter (Signed)
No problems post procedure. 

## 2017-02-06 DIAGNOSIS — F411 Generalized anxiety disorder: Secondary | ICD-10-CM | POA: Insufficient documentation

## 2017-02-12 ENCOUNTER — Other Ambulatory Visit: Payer: Self-pay | Admitting: Family Medicine

## 2017-02-12 ENCOUNTER — Ambulatory Visit
Admission: RE | Admit: 2017-02-12 | Discharge: 2017-02-12 | Disposition: A | Discharge status: Home / Self Care | Payer: Medicare Other | Source: Ambulatory Visit | Attending: Family Medicine | Admitting: Family Medicine

## 2017-02-12 DIAGNOSIS — R600 Localized edema: Secondary | ICD-10-CM

## 2017-02-13 ENCOUNTER — Encounter: Payer: Self-pay | Admitting: Student in an Organized Health Care Education/Training Program

## 2017-02-13 ENCOUNTER — Ambulatory Visit
Payer: Medicare Other | Attending: Student in an Organized Health Care Education/Training Program | Admitting: Student in an Organized Health Care Education/Training Program

## 2017-02-13 VITALS — BP 144/80 | HR 76 | Temp 98.3°F | Resp 16 | Ht 74.0 in | Wt 262.0 lb

## 2017-02-13 DIAGNOSIS — F329 Major depressive disorder, single episode, unspecified: Secondary | ICD-10-CM | POA: Insufficient documentation

## 2017-02-13 DIAGNOSIS — Z7982 Long term (current) use of aspirin: Secondary | ICD-10-CM | POA: Diagnosis not present

## 2017-02-13 DIAGNOSIS — R55 Syncope and collapse: Secondary | ICD-10-CM | POA: Insufficient documentation

## 2017-02-13 DIAGNOSIS — M5136 Other intervertebral disc degeneration, lumbar region: Secondary | ICD-10-CM | POA: Insufficient documentation

## 2017-02-13 DIAGNOSIS — Z79891 Long term (current) use of opiate analgesic: Secondary | ICD-10-CM | POA: Insufficient documentation

## 2017-02-13 DIAGNOSIS — I10 Essential (primary) hypertension: Secondary | ICD-10-CM | POA: Insufficient documentation

## 2017-02-13 DIAGNOSIS — M47816 Spondylosis without myelopathy or radiculopathy, lumbar region: Secondary | ICD-10-CM | POA: Diagnosis not present

## 2017-02-13 DIAGNOSIS — M545 Low back pain: Secondary | ICD-10-CM | POA: Insufficient documentation

## 2017-02-13 DIAGNOSIS — M159 Polyosteoarthritis, unspecified: Secondary | ICD-10-CM

## 2017-02-13 DIAGNOSIS — M1991 Primary osteoarthritis, unspecified site: Secondary | ICD-10-CM | POA: Insufficient documentation

## 2017-02-13 DIAGNOSIS — F419 Anxiety disorder, unspecified: Secondary | ICD-10-CM | POA: Insufficient documentation

## 2017-02-13 DIAGNOSIS — M9983 Other biomechanical lesions of lumbar region: Secondary | ICD-10-CM

## 2017-02-13 DIAGNOSIS — M48061 Spinal stenosis, lumbar region without neurogenic claudication: Secondary | ICD-10-CM | POA: Diagnosis not present

## 2017-02-13 DIAGNOSIS — E785 Hyperlipidemia, unspecified: Secondary | ICD-10-CM | POA: Insufficient documentation

## 2017-02-13 DIAGNOSIS — Z79899 Other long term (current) drug therapy: Secondary | ICD-10-CM | POA: Insufficient documentation

## 2017-02-13 DIAGNOSIS — M150 Primary generalized (osteo)arthritis: Secondary | ICD-10-CM

## 2017-02-13 NOTE — Patient Instructions (Addendum)
1. Return for lumbar facet block #2 with sedation  Facet Blocks Patient Information  Description: The facets are joints in the spine between the vertebrae.  Like any joints in the body, facets can become irritated and painful.  Arthritis can also effect the facets.  By injecting steroids and local anesthetic in and around these joints, we can temporarily block the nerve supply to them.  Steroids act directly on irritated nerves and tissues to reduce selling and inflammation which often leads to decreased pain.  Facet blocks may be done anywhere along the spine from the neck to the low back depending upon the location of your pain.   After numbing the skin with local anesthetic (like Novocaine), a small needle is passed onto the facet joints under x-ray guidance.  You may experience a sensation of pressure while this is being done.  The entire block usually lasts about 15-25 minutes.   Conditions which may be treated by facet blocks:   Low back/buttock pain  Neck/shoulder pain  Certain types of headaches  Preparation for the injection:  1. Do not eat any solid food or dairy products within 8 hours of your appointment. 2. You may drink clear liquid up to 3 hours before appointment.  Clear liquids include water, black coffee, juice or soda.  No milk or cream please. 3. You may take your regular medication, including pain medications, with a sip of water before your appointment.  Diabetics should hold regular insulin (if taken separately) and take 1/2 normal NPH dose the morning of the procedure.  Carry some sugar containing items with you to your appointment. 4. A driver must accompany you and be prepared to drive you home after your procedure. 5. Bring all your current medications with you. 6. An IV may be inserted and sedation may be given at the discretion of the physician. 7. A blood pressure cuff, EKG and other monitors will often be applied during the procedure.  Some patients may need to  have extra oxygen administered for a short period. 8. You will be asked to provide medical information, including your allergies and medications, prior to the procedure.  We must know immediately if you are taking blood thinners (like Coumadin/Warfarin) or if you are allergic to IV iodine contrast (dye).  We must know if you could possible be pregnant.  Possible side-effects:   Bleeding from needle site  Infection (rare, may require surgery)  Nerve injury (rare)  Numbness & tingling (temporary)  Difficulty urinating (rare, temporary)  Spinal headache (a headache worse with upright posture)  Light-headedness (temporary)  Pain at injection site (serveral days)  Decreased blood pressure (rare, temporary)  Weakness in arm/leg (temporary)  Pressure sensation in back/neck (temporary)   Call if you experience:   Fever/chills associated with headache or increased back/neck pain  Headache worsened by an upright position  New onset, weakness or numbness of an extremity below the injection site  Hives or difficulty breathing (go to the emergency room)  Inflammation or drainage at the injection site(s)  Severe back/neck pain greater than usual  New symptoms which are concerning to you  Please note:  Although the local anesthetic injected can often make your back or neck feel good for several hours after the injection, the pain will likely return. It takes 3-7 days for steroids to work.  You may not notice any pain relief for at least one week.  If effective, we will often do a series of 2-3 injections spaced 3-6 weeks  apart to maximally decrease your pain.  After the initial series, you may be a candidate for a more permanent nerve block of the facets.  If you have any questions, please call #[phone] [facility]

## 2017-02-13 NOTE — Progress Notes (Signed)
Safety precautions to be maintained throughout the outpatient stay will include: orient to surroundings, keep bed in low position, maintain call bell within reach at all times, provide assistance with transfer out of bed and ambulation.  

## 2017-02-13 NOTE — Progress Notes (Signed)
Patient's Name: Kyle Hood  MRN: [id number]  Referring Provider: Preston Fleeting*  DOB: 1955-11-04  PCP: Orene Desanctis, MD  DOS: 02/13/2017  Note by: Edward Jolly, MD  Service setting: Ambulatory outpatient  Specialty: Interventional Pain Management  Location: [facility] (AMB) Pain Management Facility    Patient type: Established   Primary Reason(s) for Visit: Encounter for post-procedure evaluation of chronic illness with mild to moderate exacerbation CC: Back Pain (lower)  HPI  Kyle Hood is a 61 y.o. year old, male patient, who comes today for a post-procedure evaluation. He has Hypertension; Lower extremity edema; Hyperlipidemia; Transient loss of consciousness; Cellulitis; Pain in limb; and DJD (degenerative joint disease) on his problem list. His primarily concern today is the Back Pain (lower)  Pain Assessment: Location: Lower Back Radiating: n/a Onset: More than a month ago Duration: Chronic pain Quality: Aching, Heaviness, Pressure Severity: 6 /10 (self-reported pain score)  Note: Reported level is compatible with observation.                   When using our objective Pain Scale, levels between 6 and 10/10 are said to belong in an emergency room, as it progressively worsens from a 6/10, described as severely limiting, requiring emergency care not usually available at an outpatient pain management facility. At a 6/10 level, communication becomes difficult and requires great effort. Assistance to reach the emergency department may be required. Facial flushing and profuse sweating along with potentially dangerous increases in heart rate and blood pressure will be evident. Effect on ADL:   Timing: Intermittent Modifying factors: warm rice bag, rest, medication  Kyle Hood comes in today for post-procedure evaluation after the treatment done on 01/29/2017.  Further details on both, my assessment(s), as well as the proposed treatment plan, please see below.  Post-Procedure  Assessment  01/29/2017 Procedure: bilateral lumbar facets L3-L5 #1 Pre-procedure pain score:  7/10 Post-procedure pain score: 0/10         Influential Factors: BMI: 33.64 kg/m Intra-procedural challenges: None observed.         Assessment challenges: None detected.              Reported side-effects: None.        Post-procedural adverse reactions or complications: None reported         Sedation: Please see nurses note. When no sedatives are used, the analgesic levels obtained are directly associated to the effectiveness of the local anesthetics. However, when sedation is provided, the level of analgesia obtained during the initial 1 hour following the intervention, is believed to be the result of a combination of factors. These factors may include, but are not limited to: 1. The effectiveness of the local anesthetics used. 2. The effects of the analgesic(s) and/or anxiolytic(s) used. 3. The degree of discomfort experienced by the patient at the time of the procedure. 4. The patients ability and reliability in recalling and recording the events. 5. The presence and influence of possible secondary gains and/or psychosocial factors. Reported result: Relief experienced during the 1st hour after the procedure: 100 % (Ultra-Short Term Relief)            Interpretative annotation: Clinically appropriate result. Analgesia during this period is likely to be Local Anesthetic and/or IV Sedative (Analgesic/Anxiolytic) related.          Effects of local anesthetic: The analgesic effects attained during this period are directly associated to the localized infiltration of local anesthetics and therefore cary significant diagnostic value as to the  etiological location, or anatomical origin, of the pain. Expected duration of relief is directly dependent on the pharmacodynamics of the local anesthetic used. Long-acting (4-6 hours) anesthetics used.  Reported result: Relief during the next 4 to 6 hour after the  procedure: 90 % (Short-Term Relief)            Interpretative annotation: Clinically appropriate result. Analgesia during this period is likely to be Local Anesthetic-related.          Long-term benefit: Defined as the period of time past the expected duration of local anesthetics (1 hour for short-acting and 4-6 hours for long-acting). With the possible exception of prolonged sympathetic blockade from the local anesthetics, benefits during this period are typically attributed to, or associated with, other factors such as analgesic sensory neuropraxia, antiinflammatory effects, or beneficial biochemical changes provided by agents other than the local anesthetics.  Reported result: Extended relief following procedure: 25-50% (Long-Term Relief)            Interpretative annotation: Clinically appropriate result. Good relief. No permanent benefit expected. Limited inflammation. Possible mechanical aggravating factors.          Current benefits: Defined as reported results that persistent at this point in time.   Analgesia: 25-50 %            Function: Somewhat improved ROM: Somewhat improved Interpretative annotation: Recurrence of symptoms. No permanent benefit expected. Effective diagnostic intervention.          Interpretation: Results would suggest a successful diagnostic intervention. We'll proceed with diagnostic intervention #2, as soon as convenient Kyle Hood indicates having had an unsuccessful trial of physical therapy, which he described as non-beneficial and painful.  Plan:  Please see "Plan of Care" for details.       Laboratory Chemistry  Inflammation Markers (CRP: Acute Phase) (ESR: Chronic Phase) No results found for: CRP, ESRSEDRATE               Renal Function Markers Lab Results  Component Value Date   BUN 11 06/19/2016   CREATININE 0.97 06/19/2016   GFRAA >60 06/19/2016   GFRNONAA >60 06/19/2016                 Hepatic Function Markers Lab Results  Component Value  Date   AST 37 06/14/2016   ALT 33 06/14/2016   ALBUMIN 4.6 06/14/2016   ALKPHOS 77 06/14/2016                 Electrolytes Lab Results  Component Value Date   NA 139 06/19/2016   K 4.3 06/19/2016   CL 108 06/19/2016   CALCIUM 8.8 (L) 06/19/2016                 Neuropathy Markers No results found for: [id number]               Bone Pathology Markers Lab Results  Component Value Date   ALKPHOS 77 06/14/2016   CALCIUM 8.8 (L) 06/19/2016                 Coagulation Parameters Lab Results  Component Value Date   INR 0.96 12/30/2015   LABPROT 12.8 12/30/2015   PLT 309 06/19/2016                 Cardiovascular Markers Lab Results  Component Value Date   HGB 14.6 06/19/2016   HCT 41.5 06/19/2016  Note: Lab results reviewed.  Recent Diagnostic Imaging Results  US Venous Img Lower Unilateral Left CLINICAL DATA:  Left lower extremity edema  EXAM: LEFT LOWER EXTREMITY VENOUS DUPLEX ULTRASOUND  TECHNIQUE: Gray-scale sonography with graded compression, as well as color Doppler and duplex ultrasound were performed to evaluate the left lower extremity deep venous system from the level of the common femoral vein and including the common femoral, femoral, profunda femoral, popliteal and calf veins including the posterior tibial, peroneal and gastrocnemius veins when visible. The superficial great saphenous vein was also interrogated. Spectral Doppler was utilized to evaluate flow at rest and with distal augmentation maneuvers in the common femoral, femoral and popliteal veins.  COMPARISON:  June 14, 2016  FINDINGS: Contralateral Common Femoral Vein: Respiratory phasicity is normal and symmetric with the symptomatic side. No evidence of thrombus. Normal compressibility.  Common Femoral Vein: No evidence of thrombus. Normal compressibility, respiratory phasicity and response to augmentation.  Saphenofemoral Junction: No evidence of thrombus.  Normal compressibility and flow on color Doppler imaging.  Profunda Femoral Vein: No evidence of thrombus. Normal compressibility and flow on color Doppler imaging.  Femoral Vein: No evidence of thrombus. Normal compressibility, respiratory phasicity and response to augmentation.  Popliteal Vein: No evidence of thrombus. Normal compressibility, respiratory phasicity and response to augmentation.  Calf Veins: No evidence of thrombus. Normal compressibility and flow on color Doppler imaging.  Superficial Great Saphenous Vein: No evidence of thrombus. Normal compressibility.  Venous Reflux:  None.  Other Findings:  None.  IMPRESSION: No evidence of deep venous thrombosis in the left lower extremity. Right common femoral vein also patent.  Electronically Signed   By: Bretta Bang III M.D.   On: 02/12/2017 15:46  Complexity Note: Imaging results reviewed. Results shared with Mr. Corcuera, using Layman's terms.                         Meds   Current Outpatient Prescriptions:  .  aspirin 325 MG EC tablet, Take 325 mg by mouth daily., Disp: , Rfl:  .  buPROPion (WELLBUTRIN XL) 150 MG 24 hr tablet, Take 150 mg by mouth every morning., Disp: , Rfl:  .  clonazePAM (KLONOPIN) 0.5 MG tablet, Take 0.5 mg by mouth daily. 1/2 tablet, Disp: , Rfl:  .  hydrochlorothiazide (HYDRODIURIL) 25 MG tablet, Take 25 mg by mouth daily., Disp: , Rfl:  .  naproxen (NAPROSYN) 500 MG tablet, Take 500 mg by mouth 2 (two) times daily with a meal., Disp: , Rfl:  .  Vilazodone HCl (VIIBRYD) 10 MG TABS, Take by mouth daily., Disp: , Rfl:  .  diclofenac sodium (VOLTAREN) 1 % GEL, Apply 4 g topically 4 (four) times daily. (Patient not taking: Reported on 01/29/2017), Disp: 100 g, Rfl: 2 .  levofloxacin (LEVAQUIN) 750 MG tablet, Take 1 tablet (750 mg total) by mouth daily. (Patient not taking: Reported on 01/21/2017), Disp: 10 tablet, Rfl: 0 .  oxyCODONE-acetaminophen (ROXICET) 5-325 MG tablet, Take 1 tablet  by mouth every 4 (four) hours as needed for severe pain. (Patient not taking: Reported on 01/21/2017), Disp: 20 tablet, Rfl: 0  ROS  Constitutional: Denies any fever or chills Gastrointestinal: No reported hemesis, hematochezia, vomiting, or acute GI distress Musculoskeletal: Denies any acute onset joint swelling, redness, loss of ROM, or weakness Neurological: No reported episodes of acute onset apraxia, aphasia, dysarthria, agnosia, amnesia, paralysis, loss of coordination, or loss of consciousness  Allergies  Mr. Digeronimo is allergic to lisinopril.  PFSH  Drug: Mr. Zilch  reports that he does not use drugs. Alcohol:  reports that he does not drink alcohol. Tobacco:  reports that he has never smoked. He has never used smokeless tobacco. Medical:  has a past medical history of Abnormal liver function test; Anxiety; Arthritis; Depression; Fatty liver; Hepatitis B; Hyperlipidemia; Hypertension; and Panic attacks. Surgical: Mr. Fawcett  has a past surgical history that includes Prostate biopsy. Family: family history includes Aortic aneurysm in his father; Cancer in his father and mother; Congestive Heart Failure in his father; Diabetes in his mother; Heart disease in his father; Hypertension in his father and mother; Varicose Veins in his father and mother.  Constitutional Exam  General appearance: Well nourished, well developed, and well hydrated. In no apparent acute distress Vitals:   02/13/17 1048  BP: (!) 144/80  Pulse: 76  Resp: 16  Temp: 98.3 F (36.8 C)  TempSrc: Oral  SpO2: 99%  Weight: 262 lb (118.8 kg)  Height: 6\' 2"  (1.88 m)   BMI Assessment: Estimated body mass index is 33.64 kg/m as calculated from the following:   Height as of this encounter: 6\' 2"  (1.88 m).   Weight as of this encounter: 262 lb (118.8 kg).  BMI interpretation table: BMI level Category Range association with higher incidence of chronic pain  <18 kg/m2 Underweight   18.5-24.9 kg/m2 Ideal body  weight   25-29.9 kg/m2 Overweight Increased incidence by 20%  30-34.9 kg/m2 Obese (Class I) Increased incidence by 68%  35-39.9 kg/m2 Severe obesity (Class II) Increased incidence by 136%  >40 kg/m2 Extreme obesity (Class III) Increased incidence by 254%   BMI Readings from Last 4 Encounters:  02/13/17 33.64 kg/m  01/29/17 35.26 kg/m  01/21/17 33.38 kg/m  07/01/16 35.34 kg/m   Wt Readings from Last 4 Encounters:  02/13/17 262 lb (118.8 kg)  01/29/17 260 lb (117.9 kg)  01/21/17 260 lb (117.9 kg)  07/01/16 260 lb 9.6 oz (118.2 kg)  Psych/Mental status: Alert, oriented x 3 (person, place, & time)       Eyes: PERLA Respiratory: No evidence of acute respiratory distress  Cervical Spine Area Exam  Skin & Axial Inspection: No masses, redness, edema, swelling, or associated skin lesions Alignment: Symmetrical Functional ROM: Unrestricted ROM      Stability: No instability detected Muscle Tone/Strength: Functionally intact. No obvious neuro-muscular anomalies detected. Sensory (Neurological): Unimpaired Palpation: No palpable anomalies              Upper Extremity (UE) Exam    Side: Right upper extremity  Side: Left upper extremity  Skin & Extremity Inspection: Skin color, temperature, and hair growth are WNL. No peripheral edema or cyanosis. No masses, redness, swelling, asymmetry, or associated skin lesions. No contractures.  Skin & Extremity Inspection: Skin color, temperature, and hair growth are WNL. No peripheral edema or cyanosis. No masses, redness, swelling, asymmetry, or associated skin lesions. No contractures.  Functional ROM: Unrestricted ROM          Functional ROM: Unrestricted ROM          Muscle Tone/Strength: Functionally intact. No obvious neuro-muscular anomalies detected.  Muscle Tone/Strength: Functionally intact. No obvious neuro-muscular anomalies detected.  Sensory (Neurological): Unimpaired          Sensory (Neurological): Unimpaired          Palpation: No  palpable anomalies              Palpation: No palpable anomalies  Specialized Test(s): Deferred         Specialized Test(s): Deferred          Thoracic Spine Area Exam  Skin & Axial Inspection: No masses, redness, or swelling Alignment: Symmetrical Functional ROM: Unrestricted ROM Stability: No instability detected Muscle Tone/Strength: Functionally intact. No obvious neuro-muscular anomalies detected. Sensory (Neurological): Unimpaired Muscle strength & Tone: No palpable anomalies  Lumbar Spine Area Exam  Skin & Axial Inspection: No masses, redness, or swelling Alignment: Symmetrical Functional ROM: Unrestricted ROM      Stability: No instability detected Muscle Tone/Strength: Functionally intact. No obvious neuro-muscular anomalies detected. Sensory (Neurological): Unimpaired Palpation: No palpable anomalies       Provocative Tests: Lumbar Hyperextension and rotation test: Improved after treatmentfor 3-4 days       Lumbar Lateral bending test: Improved after treatmentfor 3-4 days       Patrick's Maneuver: evaluation deferred today                    Gait & Posture Assessment  Ambulation: Unassisted Gait: Relatively normal for age and body habitus Posture: WNL   Lower Extremity Exam    Side: Right lower extremity  Side: Left lower extremity  Skin & Extremity Inspection: Skin color, temperature, and hair growth are WNL. No peripheral edema or cyanosis. No masses, redness, swelling, asymmetry, or associated skin lesions. No contractures.  Skin & Extremity Inspection: Skin color, temperature, and hair growth are WNL. No peripheral edema or cyanosis. No masses, redness, swelling, asymmetry, or associated skin lesions. No contractures.  Functional ROM: Unrestricted ROM          Functional ROM: Unrestricted ROM          Muscle Tone/Strength: Functionally intact. No obvious neuro-muscular anomalies detected.  Muscle Tone/Strength: Functionally intact. No obvious neuro-muscular  anomalies detected.  Sensory (Neurological): Unimpaired  Sensory (Neurological): Unimpaired  Palpation: No palpable anomalies  Palpation: No palpable anomalies   Assessment  Primary Diagnosis & Pertinent Problem List: The primary encounter diagnosis was Spondylosis without myelopathy or radiculopathy, lumbar region. Diagnoses of Lumbar degenerative disc disease, Lumbar facet arthropathy, Neuroforaminal stenosis of lumbar spine, and Primary osteoarthritis involving multiple joints were also pertinent to this visit.  Status Diagnosis  Responding Stable Responding 1. Spondylosis without myelopathy or radiculopathy, lumbar region   2. Lumbar degenerative disc disease   3. Lumbar facet arthropathy   4. Neuroforaminal stenosis of lumbar spine   5. Primary osteoarthritis involving multiple joints      61 year old gentleman with a history of hypertension, hyperlipidemia, history of cellulitis and left anterior leg who presents withaxial low back pain with radiation to bilateral legs for many years which has worsened over the last 2-3 years. Patient describes pain as aching and throbbing which is remains in his lower back and buttock region. It rarely extends below his knees. Patient also deals with Achilles tendinosis which which results in bilateral ankle pain as well.  Patient has not tried any interventional therapies including lumbar facet blocks or epidural steroid injections. He only takes naproxen 500 mg twice a day for his low back. Patient has had nerve conduction studies performed along with EMG which showed chronic lumbar polyradiculopathy along with severe lower extremity sensory polyneuropathy.  Patient's axial low back pain with radiation to bilateral buttocks and posterior thighs to be secondary to lumbar spondylosis as upported by lumbar MRI and physical exam findings of pain with lumbar extension and lateral rotation as well as pain overlying the lumbar facets  to deep palpation. Patient  states that he has tried to do physical therapy exercises at home that he found online was unable to do them secondary to pain.   Patient is status post diagnostic bilateral lumbar facet blocks on 01/29/2017. He presents today for follow-up. He states that the blocks were very effective for his low back pain for approximately 4-5 days. His pain is worse during those 4-5 days were 0-1 he notes approximately 75% improvement in his low back and buttock symptoms for that period of time. Patient also notes improved range of motion and improvement in his ability to perform activities of daily living for 3 or 4 days after the block. This suggests improvement in functional status suggesting that the patient could get long-term benefit from radiofrequency ablation. Patient would like to proceed with lumbar diagnostic blocks #2 and then radiofrequency ablation.  Plan: -Diagnostic bilateral lumbar facet blocks at L3/L4, L4/L5, L5/S1 set #2 with goal radiofrequency ablation thereafter -After treatment of lumbar spine, obtain cervical MRI the patient continues to endorse neck pain and upper extremity symptoms and consider cervical epidural versus cervical diagnostic facet blocks.  Lab-work, procedure(s), and/or referral(s): Orders Placed This Encounter  Procedures  . LUMBAR FACET(MEDIAL BRANCH NERVE BLOCK) MB[facility]   Interventional management options: Planned, scheduled, and/or pending:    -diagnostic bilateral lumbar facet block #2 followed by bilateral radiofrequency ablation   Considering:   -cervical epidural steroid injection pending cervical MRI -cervical facet blocks pending cervical MRI -Bilateral sacroiliac joint injections   Provider-requested follow-up: Return for Procedure.  Future Appointments Date Time Provider Department Center  03/05/2017 8:45 AM Edward Jolly, MD [facility] None    Primary Care Physician: Orene Desanctis, MD Location: [facility] Note by:  Edward Jolly, M.D Date: 02/13/2017; Time: 2:39 PM  Patient Instructions  1. Return for lumbar facet block #2 with sedation  Facet Blocks Patient Information  Description: The facets are joints in the spine between the vertebrae.  Like any joints in the body, facets can become irritated and painful.  Arthritis can also effect the facets.  By injecting steroids and local anesthetic in and around these joints, we can temporarily block the nerve supply to them.  Steroids act directly on irritated nerves and tissues to reduce selling and inflammation which often leads to decreased pain.  Facet blocks may be done anywhere along the spine from the neck to the low back depending upon the location of your pain.   After numbing the skin with local anesthetic (like Novocaine), a small needle is passed onto the facet joints under x-ray guidance.  You may experience a sensation of pressure while this is being done.  The entire block usually lasts about 15-25 minutes.   Conditions which may be treated by facet blocks:   Low back/buttock pain  Neck/shoulder pain  Certain types of headaches  Preparation for the injection:  1. Do not eat any solid food or dairy products within 8 hours of your appointment. 2. You may drink clear liquid up to 3 hours before appointment.  Clear liquids include water, black coffee, juice or soda.  No milk or cream please. 3. You may take your regular medication, including pain medications, with a sip of water before your appointment.  Diabetics should hold regular insulin (if taken separately) and take 1/2 normal NPH dose the morning of the procedure.  Carry some sugar containing items with you to your appointment. 4. A driver must accompany you and be prepared to  drive you home after your procedure. 5. Bring all your current medications with you. 6. An IV may be inserted and sedation may be given at the discretion of the physician. 7. A blood pressure cuff, EKG and other  monitors will often be applied during the procedure.  Some patients may need to have extra oxygen administered for a short period. 8. You will be asked to provide medical information, including your allergies and medications, prior to the procedure.  We must know immediately if you are taking blood thinners (like Coumadin/Warfarin) or if you are allergic to IV iodine contrast (dye).  We must know if you could possible be pregnant.  Possible side-effects:   Bleeding from needle site  Infection (rare, may require surgery)  Nerve injury (rare)  Numbness & tingling (temporary)  Difficulty urinating (rare, temporary)  Spinal headache (a headache worse with upright posture)  Light-headedness (temporary)  Pain at injection site (serveral days)  Decreased blood pressure (rare, temporary)  Weakness in arm/leg (temporary)  Pressure sensation in back/neck (temporary)   Call if you experience:   Fever/chills associated with headache or increased back/neck pain  Headache worsened by an upright position  New onset, weakness or numbness of an extremity below the injection site  Hives or difficulty breathing (go to the emergency room)  Inflammation or drainage at the injection site(s)  Severe back/neck pain greater than usual  New symptoms which are concerning to you  Please note:  Although the local anesthetic injected can often make your back or neck feel good for several hours after the injection, the pain will likely return. It takes 3-7 days for steroids to work.  You may not notice any pain relief for at least one week.  If effective, we will often do a series of 2-3 injections spaced 3-6 weeks apart to maximally decrease your pain.  After the initial series, you may be a candidate for a more permanent nerve block of the facets.  If you have any questions, please call #[phone] [facility]

## 2017-03-05 ENCOUNTER — Ambulatory Visit (HOSPITAL_BASED_OUTPATIENT_CLINIC_OR_DEPARTMENT_OTHER): Payer: Medicare Other | Admitting: Student in an Organized Health Care Education/Training Program

## 2017-03-05 ENCOUNTER — Ambulatory Visit
Admission: RE | Admit: 2017-03-05 | Discharge: 2017-03-05 | Disposition: A | Discharge status: Home / Self Care | Payer: Medicare Other | Source: Ambulatory Visit | Attending: Student in an Organized Health Care Education/Training Program | Admitting: Student in an Organized Health Care Education/Training Program

## 2017-03-05 ENCOUNTER — Encounter: Payer: Self-pay | Admitting: Student in an Organized Health Care Education/Training Program

## 2017-03-05 VITALS — BP 125/75 | HR 70 | Temp 98.3°F | Resp 13 | Ht 74.0 in | Wt 262.0 lb

## 2017-03-05 DIAGNOSIS — M1288 Other specific arthropathies, not elsewhere classified, other specified site: Secondary | ICD-10-CM | POA: Insufficient documentation

## 2017-03-05 DIAGNOSIS — M47816 Spondylosis without myelopathy or radiculopathy, lumbar region: Secondary | ICD-10-CM

## 2017-03-05 DIAGNOSIS — M48061 Spinal stenosis, lumbar region without neurogenic claudication: Secondary | ICD-10-CM

## 2017-03-05 DIAGNOSIS — M9983 Other biomechanical lesions of lumbar region: Secondary | ICD-10-CM

## 2017-03-05 DIAGNOSIS — M5136 Other intervertebral disc degeneration, lumbar region: Secondary | ICD-10-CM

## 2017-03-05 DIAGNOSIS — M545 Low back pain: Secondary | ICD-10-CM | POA: Diagnosis present

## 2017-03-05 MED ORDER — LACTATED RINGERS IV SOLN
1000.0000 mL | Freq: Once | INTRAVENOUS | Status: AC
  Administered 2017-03-05: 1000 mL via INTRAVENOUS

## 2017-03-05 MED ORDER — FENTANYL CITRATE (PF) 100 MCG/2ML IJ SOLN
25.0000 ug | INTRAMUSCULAR | Status: DC | PRN
Start: 2017-03-05 — End: 2017-03-05
  Administered 2017-03-05: 75 ug via INTRAVENOUS
  Filled 2017-03-05: qty 2

## 2017-03-05 MED ORDER — LIDOCAINE HCL (PF) 1 % IJ SOLN
10.0000 mL | Freq: Once | INTRAMUSCULAR | Status: AC
  Administered 2017-03-05: 5 mL
  Filled 2017-03-05: qty 10

## 2017-03-05 MED ORDER — ROPIVACAINE HCL 2 MG/ML IJ SOLN
10.0000 mL | Freq: Once | INTRAMUSCULAR | Status: AC
  Administered 2017-03-05: 10 mL
  Filled 2017-03-05: qty 10

## 2017-03-05 NOTE — Progress Notes (Signed)
Patient's Name: Kyle Hood  MRN: [id number]  Referring Provider: Orene Desanctis, MD  DOB: 1956-02-11  PCP: Orene Desanctis, MD  DOS: 03/05/2017  Note by: Edward Jolly, MD  Service setting: Ambulatory outpatient  Specialty: Interventional Pain Management  Patient type: Established  Location: [facility] (AMB) Pain Management Facility  Visit type: Interventional Procedure   Primary Reason for Visit: Interventional Pain Management Treatment. CC: Back Pain (low) and Arm Pain (left and radiates to hand)  Procedure:  Anesthesia, Analgesia, Anxiolysis:  Type: Diagnostic Medial Branch Facet Block #2 Region: Lumbar Level:  L3, L4, L5, Medial Branch Level(s) Laterality: Bilateral  Type: Local Anesthesia with Moderate (Conscious) Sedation Local Anesthetic: Lidocaine 1% Route: Intravenous (IV) IV Access: Secured Sedation: Meaningful verbal contact was maintained at all times during the procedure  Indication(s): Analgesia and Anxiety   Indications: 1. Lumbar spondylosis   2. Lumbar degenerative disc disease   3. Lumbar facet arthropathy   4. Neuroforaminal stenosis of lumbar spine    Pain Score: Pre-procedure: 7 /10 Post-procedure: 0-No pain/10  Pre-op Assessment:  Kyle Hood is a 61 y.o. (year old), adult patient, seen today for interventional treatment. He  has a past surgical history that includes Prostate biopsy. Kyle Hood has a current medication list which includes the following prescription(s): clonazepam, diclofenac sodium, hydrochlorothiazide, naproxen, vilazodone hcl, aspirin, bupropion, levofloxacin, and oxycodone-acetaminophen, and the following Facility-Administered Medications: fentanyl. His primarily concern today is the Back Pain (low) and Arm Pain (left and radiates to hand)  Initial Vital Signs: Blood pressure 125/82, pulse 71, temperature 98 F (36.7 C), resp. rate 18, height 6' (1.829 m), weight 260 lb (117.9 kg), SpO2 97 %. BMI: Estimated body mass index is 33.64 kg/m as  calculated from the following:   Height as of this encounter: 6\' 2"  (1.88 m).   Weight as of this encounter: 262 lb (118.8 kg).  Risk Assessment: Allergies: Reviewed. He is allergic to lisinopril.  Allergy Precautions: None required Coagulopathies: Reviewed. None identified.  Blood-thinner therapy: None at this time Active Infection(s): Reviewed. None identified. Kyle Hood is afebrile  Site Confirmation: Kyle Hood was asked to confirm the procedure and laterality before marking the site Procedure checklist: Completed Consent: Before the procedure and under the influence of no sedative(s), amnesic(s), or anxiolytics, the patient was informed of the treatment options, risks and possible complications. To fulfill our ethical and legal obligations, as recommended by the American Medical Association's Code of Ethics, I have informed the patient of my clinical impression; the nature and purpose of the treatment or procedure; the risks, benefits, and possible complications of the intervention; the alternatives, including doing nothing; the risk(s) and benefit(s) of the alternative treatment(s) or procedure(s); and the risk(s) and benefit(s) of doing nothing. The patient was provided information about the general risks and possible complications associated with the procedure. These may include, but are not limited to: failure to achieve desired goals, infection, bleeding, organ or nerve damage, allergic reactions, paralysis, and death. In addition, the patient was informed of those risks and complications associated to Spine-related procedures, such as failure to decrease pain; infection (i.e.: Meningitis, epidural or intraspinal abscess); bleeding (i.e.: epidural hematoma, subarachnoid hemorrhage, or any other type of intraspinal or peri-dural bleeding); organ or nerve damage (i.e.: Any type of peripheral nerve, nerve root, or spinal cord injury) with subsequent damage to sensory, motor, and/or  autonomic systems, resulting in permanent pain, numbness, and/or weakness of one or several areas of the body; allergic reactions; (i.e.: anaphylactic reaction); and/or death. Furthermore, the  patient was informed of those risks and complications associated with the medications. These include, but are not limited to: allergic reactions (i.e.: anaphylactic or anaphylactoid reaction(s)); adrenal axis suppression; blood sugar elevation that in diabetics may result in ketoacidosis or comma; water retention that in patients with history of congestive heart failure may result in shortness of breath, pulmonary edema, and decompensation with resultant heart failure; weight gain; swelling or edema; medication-induced neural toxicity; particulate matter embolism and blood vessel occlusion with resultant organ, and/or nervous system infarction; and/or aseptic necrosis of one or more joints. Finally, the patient was informed that Medicine is not an exact science; therefore, there is also the possibility of unforeseen or unpredictable risks and/or possible complications that may result in a catastrophic outcome. The patient indicated having understood very clearly. We have given the patient no guarantees and we have made no promises. Enough time was given to the patient to ask questions, all of which were answered to the patient's satisfaction. Kyle Hood has indicated that he wanted to continue with the procedure. Attestation: I, the ordering provider, attest that I have discussed with the patient the benefits, risks, side-effects, alternatives, likelihood of achieving goals, and potential problems during recovery for the procedure that I have provided informed consent. Date: 03/05/2017; Time: 2:27 PM  Pre-Procedure Preparation:  Monitoring: As per clinic protocol. Respiration, ETCO2, SpO2, BP, heart rate and rhythm monitor placed and checked for adequate function Safety Precautions: Patient was assessed for positional  comfort and pressure points before starting the procedure. Time-out: I initiated and conducted the "Time-out" before starting the procedure, as per protocol. The patient was asked to participate by confirming the accuracy of the "Time Out" information. Verification of the correct person, site, and procedure were performed and confirmed by me, the nursing staff, and the patient. "Time-out" conducted as per Joint Commission's Universal Protocol (UP.01.01.01). "Time-out" Date & Time: 03/05/2017; 0955 hrs.  Description of Procedure Process:   Position: Prone Target Area: For Lumbar Facet blocks, the target is the groove formed by the junction of the transverse process and superior articular process. For the L5 dorsal ramus, the target is the notch between superior articular process and sacral ala.  Area Prepped: Entire Posterior Lumbosacral Region Prepping solution: ChloraPrep (2% chlorhexidine gluconate and 70% isopropyl alcohol) Safety Precautions: Aspiration looking for blood return was conducted prior to all injections. At no point did we inject any substances, as a needle was being advanced. No attempts were made at seeking any paresthesias. Safe injection practices and needle disposal techniques used. Medications properly checked for expiration dates. SDV (single dose vial) medications used. Description of the Procedure: Protocol guidelines were followed. The patient was placed in position over the fluoroscopy table. The target area was identified and the area prepped in the usual manner. Skin desensitized using vapocoolant spray. Skin & deeper tissues infiltrated with local anesthetic. Appropriate amount of time allowed to pass for local anesthetics to take effect. The procedure needle was introduced through the skin, ipsilateral to the reported pain, and advanced to the target area. Employing the "Medial Branch Technique", the needles were advanced to the angle made by the superior and medial portion of  the transverse process, and the lateral and inferior portion of the superior articulating process of the targeted vertebral bodies. This area is known as "Burton's Eye" or the "Eye of the [place] Dog". A procedure needle was introduced through the skin, and this time advanced to the angle made by the superior and medial border of  the sacral ala, and the lateral border of the S1 vertebral body.  Negative aspiration confirmed. Solution injected in intermittent fashion, asking for systemic symptoms every 0.5cc of injectate. The needles were then removed and the area cleansed, making sure to leave some of the prepping solution back to take advantage of its long term bactericidal properties.   Illustration of the posterior view of the lumbar spine and the posterior neural structures. Laminae of L2 through S1 are labeled. DPRL5, dorsal primary ramus of L5; DPRS1, dorsal primary ramus of S1; DPR3, dorsal primary ramus of L3; FJ, facet (zygapophyseal) joint L3-L4; I, inferior articular process of L4; LB1, lateral branch of dorsal primary ramus of L1; IAB, inferior articular branches from L3 medial branch (supplies L4-L5 facet joint); IBP, intermediate branch plexus; MB3, medial branch of dorsal primary ramus of L3; NR3, third lumbar nerve root; S, superior articular process of L5; SAB, superior articular branches from L4 (supplies L4-5 facet joint also); TP3, transverse process of L3.  Vitals:   03/05/17 1013 03/05/17 1023 03/05/17 1033 03/05/17 1043  BP: (!) 150/88 (!) 151/96 131/64 125/75  Pulse: 89 72 71 70  Resp: 13 13 12 13   Temp:      TempSrc:      SpO2: 98% 99% 98% 99%  Weight:      Height:        Start Time: 0958 hrs. End Time: 1012 hrs. Materials:  Needle(s) Type: Regular needle Gauge: 22G Length: 3.5-in Medication(s): We administered lactated ringers, fentaNYL, lidocaine (PF), and ropivacaine (PF) 2 mg/mL (0.2%). Please see chart orders for dosing details.  Imaging Guidance (Spinal):    Type of Imaging Technique: Fluoroscopy Guidance (Spinal) Indication(s): Assistance in needle guidance and placement for procedures requiring needle placement in or near specific anatomical locations not easily accessible without such assistance. Exposure Time: Please see nurses notes. Contrast: None used. Fluoroscopic Guidance: I was personally present during the use of fluoroscopy. "Tunnel Vision Technique" used to obtain the best possible view of the target area. Parallax error corrected before commencing the procedure. "Direction-depth-direction" technique used to introduce the needle under continuous pulsed fluoroscopy. Once target was reached, antero-posterior, oblique, and lateral fluoroscopic projection used confirm needle placement in all planes. Images permanently stored in EMR. Interpretation: No contrast injected. I personally interpreted the imaging intraoperatively. Adequate needle placement confirmed in multiple planes. Permanent images saved into the patient's record. 1.5 cc of 0.2% ropivacaine at each level. Antibiotic Prophylaxis:  Indication(s): None identified Antibiotic given: None  Post-operative Assessment:  EBL: None Complications: No immediate post-treatment complications observed by team, or reported by patient. Note: The patient tolerated the entire procedure well. A repeat set of vitals were taken after the procedure and the patient was kept under observation following institutional policy, for this type of procedure. Post-procedural neurological assessment was performed, showing return to baseline, prior to discharge. The patient was provided with post-procedure discharge instructions, including a section on how to identify potential problems. Should any problems arise concerning this procedure, the patient was given instructions to immediately contact us, at any time, without hesitation. In any case, we plan to contact the patient by telephone for a follow-up status  report regarding this interventional procedure. Comments:  No additional relevant information.  Plan of Care   Imaging Orders     DG C-Arm 1-60 Min-No Report    5 out of 5 strength bilateral lower extremity upon discharge: Plantar flexion, dorsiflexion, knee flexion, knee extension.  Follow-up in 3 weeks for postprocedural evaluation.  We will discuss radiofrequency ablation at that time.  Medications ordered for procedure: Meds ordered this encounter  Medications  . lactated ringers infusion 1,000 mL  . fentaNYL (SUBLIMAZE) injection 25-50 mcg    Make sure Narcan is available in the pyxis when using this medication. In the event of respiratory depression (RR< 8/min): Titrate NARCAN (naloxone) in increments of 0.1 to 0.2 mg IV at 2-3 minute intervals, until desired degree of reversal.  . lidocaine (PF) (XYLOCAINE) 1 % injection 10 mL  . ropivacaine (PF) 2 mg/mL (0.2%) (NAROPIN) injection 10 mL   Medications administered: We administered lactated ringers, fentaNYL, lidocaine (PF), and ropivacaine (PF) 2 mg/mL (0.2%).  See the medical record for exact dosing, route, and time of administration.  This SmartLink is deprecated. Use AVSMEDLIST instead to display the medication list for a patient. Disposition: Discharge home  Discharge Date & Time: 03/05/2017; 1048 hrs.   Physician-requested Follow-up: Return in about 3 weeks (around 03/26/2017) for Post Procedure Evaluation. Future Appointments  Date Time Provider Department Center  04/01/2017 10:00 AM Edward Jolly, MD [facility] None   Primary Care Physician: Orene Desanctis, MD Location: [facility] Note by: Edward Jolly, MD Date: 03/05/2017; Time: 11:37 AM  Disclaimer:  Medicine is not an exact science. The only guarantee in medicine is that nothing is guaranteed. It is important to note that the decision to proceed with this intervention was based on the information collected from the patient. The Data  and conclusions were drawn from the patient's questionnaire, the interview, and the physical examination. Because the information was provided in large part by the patient, it cannot be guaranteed that it has not been purposely or unconsciously manipulated. Every effort has been made to obtain as much relevant data as possible for this evaluation. It is important to note that the conclusions that lead to this procedure are derived in large part from the available data. Always take into account that the treatment will also be dependent on availability of resources and existing treatment guidelines, considered by other Pain Management Practitioners as being common knowledge and practice, at the time of the intervention. For Medico-Legal purposes, it is also important to point out that variation in procedural techniques and pharmacological choices are the acceptable norm. The indications, contraindications, technique, and results of the above procedure should only be interpreted and judged by a Board-Certified Interventional Pain Specialist with extensive familiarity and expertise in the same exact procedure and technique.

## 2017-03-05 NOTE — Patient Instructions (Signed)

## 2017-03-05 NOTE — Progress Notes (Signed)
Safety precautions to be maintained throughout the outpatient stay will include: orient to surroundings, keep bed in low position, maintain call bell within reach at all times, provide assistance with transfer out of bed and ambulation.  

## 2017-03-06 ENCOUNTER — Telehealth: Payer: Self-pay | Admitting: *Deleted

## 2017-03-06 NOTE — Telephone Encounter (Signed)
No problems post procedure. 

## 2017-03-19 ENCOUNTER — Other Ambulatory Visit: Payer: Self-pay

## 2017-03-19 ENCOUNTER — Encounter: Payer: Self-pay | Admitting: Psychiatry

## 2017-03-19 ENCOUNTER — Ambulatory Visit (INDEPENDENT_AMBULATORY_CARE_PROVIDER_SITE_OTHER): Payer: Medicare Other | Admitting: Psychiatry

## 2017-03-19 VITALS — BP 158/87 | HR 80 | Temp 98.1°F | Wt 261.4 lb

## 2017-03-19 DIAGNOSIS — F640 Transsexualism: Secondary | ICD-10-CM | POA: Diagnosis not present

## 2017-03-19 DIAGNOSIS — F330 Major depressive disorder, recurrent, mild: Secondary | ICD-10-CM | POA: Diagnosis not present

## 2017-03-19 DIAGNOSIS — F410 Panic disorder [episodic paroxysmal anxiety] without agoraphobia: Secondary | ICD-10-CM

## 2017-03-19 DIAGNOSIS — F4010 Social phobia, unspecified: Secondary | ICD-10-CM | POA: Diagnosis not present

## 2017-03-19 MED ORDER — CLONAZEPAM 0.5 MG PO TABS: 0.5000 mg | ORAL_TABLET | Freq: Every day | ORAL | 1 refills | Status: DC

## 2017-03-19 MED ORDER — TRAZODONE HCL 50 MG PO TABS: 50.0000 mg | ORAL_TABLET | Freq: Every evening | ORAL | 1 refills | Status: DC | PRN

## 2017-03-19 MED ORDER — VILAZODONE HCL 10 MG PO TABS: 10.0000 mg | ORAL_TABLET | Freq: Every day | ORAL | 2 refills | Status: DC

## 2017-03-19 NOTE — Patient Instructions (Signed)
Trazodone tablets What is this medicine? TRAZODONE (TRAZ oh done) is used to treat depression. This medicine may be used for other purposes; ask your health care provider or pharmacist if you have questions. COMMON BRAND NAME(S): Desyrel What should I tell my health care provider before I take this medicine? They need to know if you have any of these conditions: -attempted suicide or thinking about it -bipolar disorder -bleeding problems -glaucoma -heart disease, or previous heart attack -irregular heart beat -kidney or liver disease -low levels of sodium in the blood -an unusual or allergic reaction to trazodone, other medicines, foods, dyes or preservatives -pregnant or trying to get pregnant -breast-feeding How should I use this medicine? Take this medicine by mouth with a glass of water. Follow the directions on the prescription label. Take this medicine shortly after a meal or a light snack. Take your medicine at regular intervals. Do not take your medicine more often than directed. Do not stop taking this medicine suddenly except upon the advice of your doctor. Stopping this medicine too quickly may cause serious side effects or your condition may worsen. A special MedGuide will be given to you by the pharmacist with each prescription and refill. Be sure to read this information carefully each time. Talk to your pediatrician regarding the use of this medicine in children. Special care may be needed. Overdosage: If you think you have taken too much of this medicine contact a poison control center or emergency room at once. NOTE: This medicine is only for you. Do not share this medicine with others. What if I miss a dose? If you miss a dose, take it as soon as you can. If it is almost time for your next dose, take only that dose. Do not take double or extra doses. What may interact with this medicine? Do not take this medicine with any of the following medications: -certain medicines  for fungal infections like fluconazole, itraconazole, ketoconazole, posaconazole, voriconazole -cisapride -dofetilide -dronedarone -linezolid -MAOIs like Carbex, Eldepryl, Marplan, Nardil, and Parnate -mesoridazine -methylene blue (injected into a vein) -pimozide -saquinavir -thioridazine -ziprasidone This medicine may also interact with the following medications: -alcohol -antiviral medicines for HIV or AIDS -aspirin and aspirin-like medicines -barbiturates like phenobarbital -certain medicines for blood pressure, heart disease, irregular heart beat -certain medicines for depression, anxiety, or psychotic disturbances -certain medicines for migraine headache like almotriptan, eletriptan, frovatriptan, naratriptan, rizatriptan, sumatriptan, zolmitriptan -certain medicines for seizures like carbamazepine and phenytoin -certain medicines for sleep -certain medicines that treat or prevent blood clots like dalteparin, enoxaparin, warfarin -digoxin -fentanyl -lithium -NSAIDS, medicines for pain and inflammation, like ibuprofen or naproxen -other medicines that prolong the QT interval (cause an abnormal heart rhythm) -rasagiline -supplements like St. John's wort, kava kava, valerian -tramadol -tryptophan This list may not describe all possible interactions. Give your health care provider a list of all the medicines, herbs, non-prescription drugs, or dietary supplements you use. Also tell them if you smoke, drink alcohol, or use illegal drugs. Some items may interact with your medicine. What should I watch for while using this medicine? Tell your doctor if your symptoms do not get better or if they get worse. Visit your doctor or health care professional for regular checks on your progress. Because it may take several weeks to see the full effects of this medicine, it is important to continue your treatment as prescribed by your doctor. Patients and their families should watch out for new  or worsening thoughts of suicide or depression. Also   watch out for sudden changes in feelings such as feeling anxious, agitated, panicky, irritable, hostile, aggressive, impulsive, severely restless, overly excited and hyperactive, or not being able to sleep. If this happens, especially at the beginning of treatment or after a change in dose, call your health care professional. Kyle QuinYou may get drowsy or dizzy. Do not drive, use machinery, or do anything that needs mental alertness until you know how this medicine affects you. Do not stand or sit up quickly, especially if you are an older patient. This reduces the risk of dizzy or fainting spells. Alcohol may interfere with the effect of this medicine. Avoid alcoholic drinks. This medicine may cause dry eyes and blurred vision. If you wear contact lenses you may feel some discomfort. Lubricating drops may help. See your eye doctor if the problem does not go away or is severe. Your mouth may get dry. Chewing sugarless gum, sucking hard candy and drinking plenty of water may help. Contact your doctor if the problem does not go away or is severe. What side effects may I notice from receiving this medicine? Side effects that you should report to your doctor or health care professional as soon as possible: -allergic reactions like skin rash, itching or hives, swelling of the face, lips, or tongue -elevated mood, decreased need for sleep, racing thoughts, impulsive behavior -confusion -fast, irregular heartbeat -feeling faint or lightheaded, falls -feeling agitated, angry, or irritable -loss of balance or coordination -painful or prolonged erections -restlessness, pacing, inability to keep still -suicidal thoughts or other mood changes -tremors -trouble sleeping -seizures -unusual bleeding or bruising Side effects that usually do not require medical attention (report to your doctor or health care professional if they continue or are bothersome): -change in  sex drive or performance -change in appetite or weight -constipation -headache -muscle aches or pains -nausea This list may not describe all possible side effects. Call your doctor for medical advice about side effects. You may report side effects to FDA at 1-800-FDA-1088. Where should I keep my medicine? Keep out of the reach of children. Store at room temperature between 15 and 30 degrees C (59 to 86 degrees F). Protect from light. Keep container tightly closed. Throw away any unused medicine after the expiration date. NOTE: This sheet is a summary. It may not cover all possible information. If you have questions about this medicine, talk to your doctor, pharmacist, or health care provider.  2018 Elsevier/Gold Standard (2015-09-14 16:57:05)     PLEASE TAPER OF THE KLONOPIN - TRY TAKING 1 PILL AS NEEDED SOME DAYS AND START TAKING EVERY OTHER DAY WHEN YOU CAN.
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
## 2017-03-20 ENCOUNTER — Encounter: Payer: Self-pay | Admitting: Psychiatry

## 2017-04-01 ENCOUNTER — Ambulatory Visit: Payer: Medicare Other | Admitting: Student in an Organized Health Care Education/Training Program

## 2017-04-03 ENCOUNTER — Ambulatory Visit
Payer: Medicare Other | Attending: Student in an Organized Health Care Education/Training Program | Admitting: Student in an Organized Health Care Education/Training Program

## 2017-04-03 ENCOUNTER — Encounter: Payer: Self-pay | Admitting: Student in an Organized Health Care Education/Training Program

## 2017-04-03 ENCOUNTER — Other Ambulatory Visit: Payer: Self-pay

## 2017-04-03 VITALS — BP 158/82 | HR 74 | Temp 98.2°F | Resp 18 | Ht 74.0 in | Wt 262.0 lb

## 2017-04-03 DIAGNOSIS — M47816 Spondylosis without myelopathy or radiculopathy, lumbar region: Secondary | ICD-10-CM | POA: Insufficient documentation

## 2017-04-03 DIAGNOSIS — M7662 Achilles tendinitis, left leg: Secondary | ICD-10-CM | POA: Diagnosis not present

## 2017-04-03 DIAGNOSIS — M25512 Pain in left shoulder: Secondary | ICD-10-CM | POA: Insufficient documentation

## 2017-04-03 DIAGNOSIS — I10 Essential (primary) hypertension: Secondary | ICD-10-CM | POA: Insufficient documentation

## 2017-04-03 DIAGNOSIS — L03116 Cellulitis of left lower limb: Secondary | ICD-10-CM | POA: Diagnosis not present

## 2017-04-03 DIAGNOSIS — M545 Low back pain: Secondary | ICD-10-CM | POA: Insufficient documentation

## 2017-04-03 DIAGNOSIS — F411 Generalized anxiety disorder: Secondary | ICD-10-CM | POA: Diagnosis not present

## 2017-04-03 DIAGNOSIS — M25571 Pain in right ankle and joints of right foot: Secondary | ICD-10-CM | POA: Diagnosis not present

## 2017-04-03 DIAGNOSIS — M5136 Other intervertebral disc degeneration, lumbar region: Secondary | ICD-10-CM

## 2017-04-03 DIAGNOSIS — G608 Other hereditary and idiopathic neuropathies: Secondary | ICD-10-CM | POA: Insufficient documentation

## 2017-04-03 DIAGNOSIS — E785 Hyperlipidemia, unspecified: Secondary | ICD-10-CM | POA: Insufficient documentation

## 2017-04-03 DIAGNOSIS — R55 Syncope and collapse: Secondary | ICD-10-CM | POA: Diagnosis not present

## 2017-04-03 DIAGNOSIS — M25511 Pain in right shoulder: Secondary | ICD-10-CM | POA: Diagnosis present

## 2017-04-03 DIAGNOSIS — M7989 Other specified soft tissue disorders: Secondary | ICD-10-CM | POA: Diagnosis not present

## 2017-04-03 DIAGNOSIS — Z79899 Other long term (current) drug therapy: Secondary | ICD-10-CM | POA: Diagnosis not present

## 2017-04-03 DIAGNOSIS — M1990 Unspecified osteoarthritis, unspecified site: Secondary | ICD-10-CM | POA: Diagnosis not present

## 2017-04-03 NOTE — Progress Notes (Signed)
Patient's Name: Kyle Hood  MRN: [id number]  Referring Provider: Orene Desanctis, MD  DOB: [birth year]  PCP: Orene Desanctis, MD  DOS: 04/03/2017  Note by: Edward Jolly, MD  Service setting: Ambulatory outpatient  Specialty: Interventional Pain Management  Location: [facility] (AMB) Pain Management Facility    Patient type: Established   Primary Reason(s) for Visit: Encounter for post-procedure evaluation of chronic illness with mild to moderate exacerbation CC: Back Pain (bilateral and low) and Shoulder Pain (bilateral and down left arm to pointer finger)  HPI  Kyle Hood is a 61 y.o. year old, adult patient, who comes today for a post-procedure evaluation. He has Hypertension; Lower extremity edema; Hyperlipidemia; Transient loss of consciousness; Cellulitis; Pain in limb; DJD (degenerative joint disease); Anxiety, generalized; Chronic bilateral low back pain; Lumbar polyradiculopathy; and Polyneuropathy on their problem list. His primarily concern today is the Back Pain (bilateral and low) and Shoulder Pain (bilateral and down left arm to pointer finger)  Pain Assessment: Location: Lower, Right, Left Back Radiating:   Onset: More than a month ago Duration: Chronic pain Quality: Constant, Aching, Dull Severity: 8 /10 (self-reported pain score)  Note: Reported level is inconsistent with clinical observations. Clinically the patient looks like a 1/10 A 1/10 is viewed as "Mild" and described as nagging, annoying, but not interfering with basic activities of daily living (ADL). Kyle Hood is able to eat, bathe, get dressed, do toileting (being able to get on and off the toilet and perform personal hygiene functions), transfer (move in and out of bed or a chair without assistance), and maintain continence (able to control bladder and bowel functions). Physiologic parameters such as blood pressure and heart rate apear wnl.       When using our objective Pain Scale, levels between 6 and 10/10 are said to  belong in an emergency room, as it progressively worsens from a 6/10, described as severely limiting, requiring emergency care not usually available at an outpatient pain management facility. At a 6/10 level, communication becomes difficult and requires great effort. Assistance to reach the emergency department may be required. Facial flushing and profuse sweating along with potentially dangerous increases in heart rate and blood pressure will be evident. Effect on ADL:   Timing: Constant Modifying factors: procedures, heat, rest  Kyle Hood comes in today for post-procedure evaluation after the treatment done on 03/05/2017.  Further details on both, my assessment(s), as well as the proposed treatment plan, please see below.  Post-Procedure Assessment  03/05/2017 Procedure: Bilateral L3-L5 medial branch nerve block without steroid Pre-procedure pain score:  7/10 Post-procedure pain score: 0/10         Influential Factors: BMI: 33.64 kg/m Intra-procedural challenges: None observed.         Assessment challenges: None detected.              Reported side-effects: None.        Post-procedural adverse reactions or complications: None reported         Sedation: Please see nurses note. When no sedatives are used, the analgesic levels obtained are directly associated to the effectiveness of the local anesthetics. However, when sedation is provided, the level of analgesia obtained during the initial 1 hour following the intervention, is believed to be the result of a combination of factors. These factors may include, but are not limited to: 1. The effectiveness of the local anesthetics used. 2. The effects of the analgesic(s) and/or anxiolytic(s) used. 3. The degree of discomfort experienced by the patient at  the time of the procedure. 4. The patients ability and reliability in recalling and recording the events. 5. The presence and influence of possible secondary gains and/or psychosocial  factors. Reported result: Relief experienced during the 1st hour after the procedure: 100 % (Ultra-Short Term Relief)            Interpretative annotation: Clinically appropriate result. Analgesia during this period is likely to be Local Anesthetic and/or IV Sedative (Analgesic/Anxiolytic) related.          Effects of local anesthetic: The analgesic effects attained during this period are directly associated to the localized infiltration of local anesthetics and therefore cary significant diagnostic value as to the etiological location, or anatomical origin, of the pain. Expected duration of relief is directly dependent on the pharmacodynamics of the local anesthetic used. Long-acting (4-6 hours) anesthetics used.  Reported result: Relief during the next 4 to 6 hour after the procedure: 100 % (Short-Term Relief)            Interpretative annotation: Clinically appropriate result. Analgesia during this period is likely to be Local Anesthetic-related.          Long-term benefit: Defined as the period of time past the expected duration of local anesthetics (1 hour for short-acting and 4-6 hours for long-acting). With the possible exception of prolonged sympathetic blockade from the local anesthetics, benefits during this period are typically attributed to, or associated with, other factors such as analgesic sensory neuropraxia, antiinflammatory effects, or beneficial biochemical changes provided by agents other than the local anesthetics.  Reported result: Extended relief following procedure: 0 % (Long-Term Relief)            Interpretative annotation: Clinically appropriate result. Good relief. No permanent benefit expected. Limited inflammation. Possible mechanical aggravating factors.          Current benefits: Defined as reported results that persistent at this point in time.   Analgesia: 0-25 %            Function: Back to baseline ROM: Back to baseline Interpretative annotation: Recurrence of  symptoms. No permanent benefit expected. Effective diagnostic intervention.          Interpretation: Results would suggest Kyle Hood to be a good candidate for Radiofrequency Ablation.                  Plan:  Please see "Plan of Care" for details.        Laboratory Chemistry  Inflammation Markers (CRP: Acute Phase) (ESR: Chronic Phase) Lab Results  Component Value Date   LATICACIDVEN 1.5 12/30/2015                 Rheumatology Markers No results found for: RF, ANA, Loni Beckwith, [facility]              Renal Function Markers Lab Results  Component Value Date   BUN 11 06/19/2016   CREATININE 0.97 06/19/2016   GFRAA >60 06/19/2016   GFRNONAA >60 06/19/2016                 Hepatic Function Markers Lab Results  Component Value Date   AST 37 06/14/2016   ALT 33 06/14/2016   ALBUMIN 4.6 06/14/2016   ALKPHOS 77 06/14/2016                 Electrolytes Lab Results  Component Value Date   NA 139 06/19/2016   K 4.3 06/19/2016   CL 108 06/19/2016   CALCIUM 8.8 (L) 06/19/2016  Neuropathy Markers Lab Results  Component Value Date   HGBA1C 6.1 08/16/2014   HIV Non Reactive 06/18/2016                 Bone Pathology Markers No results found for: VD25OH, [id number], PP5093OI7, TI4580DX8, 25OHVITD1, 25OHVITD2, 25OHVITD3, TESTOFREE, TESTOSTERONE               Coagulation Parameters Lab Results  Component Value Date   INR 0.96 12/30/2015   LABPROT 12.8 12/30/2015   PLT 309 06/19/2016                 Cardiovascular Markers Lab Results  Component Value Date   TROPONINI <0.03 12/30/2015   HGB 14.6 06/19/2016   HCT 41.5 06/19/2016                 CA Markers No results found for: CEA, CA125, LABCA2               Note: Lab results reviewed.  Recent Diagnostic Imaging Results  DG C-Arm 1-60 Min-No Report Fluoroscopy was utilized by the requesting physician.  No radiographic  interpretation.   Complexity Note: Imaging results  reviewed. Results shared with Kyle Hood, using Layman's terms.                         Meds   Current Outpatient Medications:  .  clonazePAM (KLONOPIN) 0.5 MG tablet, Take 1 tablet (0.5 mg total) by mouth daily. As needed, Disp: 30 tablet, Rfl: 1 .  diclofenac sodium (VOLTAREN) 1 % GEL, Apply 4 g topically 4 (four) times daily., Disp: 100 g, Rfl: 2 .  hydrochlorothiazide (HYDRODIURIL) 25 MG tablet, Take 25 mg by mouth daily., Disp: , Rfl:  .  naproxen (NAPROSYN) 500 MG tablet, Take 500 mg by mouth 2 (two) times daily with a meal., Disp: , Rfl:  .  traZODone (DESYREL) 50 MG tablet, Take 1 tablet (50 mg total) by mouth at bedtime as needed for sleep., Disp: 30 tablet, Rfl: 1 .  Vilazodone HCl (VIIBRYD) 10 MG TABS, Take 1 tablet (10 mg total) by mouth daily., Disp: 30 tablet, Rfl: 2 .  simvastatin (ZOCOR) 10 MG tablet, TAKE 1 TABLET BY MOUTH EVERY DAY AT NIGHT, Disp: , Rfl: 11  ROS  Constitutional: Denies any fever or chills Gastrointestinal: No reported hemesis, hematochezia, vomiting, or acute GI distress Musculoskeletal: Denies any acute onset joint swelling, redness, loss of ROM, or weakness Neurological: No reported episodes of acute onset apraxia, aphasia, dysarthria, agnosia, amnesia, paralysis, loss of coordination, or loss of consciousness  Allergies  Kyle Hood is allergic to lisinopril.  PFSH  Drug: Kyle Hood  reports that he does not use drugs. Alcohol:  has no alcohol history on file. Tobacco:  reports that  has never smoked. he has never used smokeless tobacco. Medical:  has a past medical history of Abnormal liver function test, Anxiety, Arthritis, Depression, Fatty liver, Heart murmur, Hepatitis B, Hyperlipidemia, Hypertension, and Panic attacks. Surgical: Kyle Hood  has a past surgical history that includes Prostate biopsy. Family: family history includes Alcohol abuse in his father and mother; Aortic aneurysm in his father; Bipolar disorder in his brother and  sister; Cancer in his father and mother; Congestive Heart Failure in his father; Diabetes in his mother; Heart disease in his father; Hypertension in his father and mother; Varicose Veins in his father and mother.  Constitutional Exam  General appearance: Well nourished, well developed, and well hydrated. In  no apparent acute distress Vitals:   04/03/17 1022  BP: (!) 158/82  Pulse: 74  Resp: 18  Temp: 98.2 F (36.8 C)  TempSrc: Oral  SpO2: 99%  Weight: 262 lb (118.8 kg)  Height: 6\' 2"  (1.88 m)   BMI Assessment: Estimated body mass index is 33.64 kg/m as calculated from the following:   Height as of this encounter: 6\' 2"  (1.88 m).   Weight as of this encounter: 262 lb (118.8 kg).  BMI interpretation table: BMI level Category Range association with higher incidence of chronic pain  <18 kg/m2 Underweight   18.5-24.9 kg/m2 Ideal body weight   25-29.9 kg/m2 Overweight Increased incidence by 20%  30-34.9 kg/m2 Obese (Class I) Increased incidence by 68%  35-39.9 kg/m2 Severe obesity (Class II) Increased incidence by 136%  >40 kg/m2 Extreme obesity (Class III) Increased incidence by 254%   BMI Readings from Last 4 Encounters:  04/03/17 33.64 kg/m  03/05/17 33.64 kg/m  02/13/17 33.64 kg/m  01/29/17 35.26 kg/m   Wt Readings from Last 4 Encounters:  04/03/17 262 lb (118.8 kg)  03/05/17 262 lb (118.8 kg)  02/13/17 262 lb (118.8 kg)  01/29/17 260 lb (117.9 kg)  Psych/Mental status: Alert, oriented x 3 (person, place, & time)       Eyes: PERLA Respiratory: No evidence of acute respiratory distress  Cervical Spine Area Exam  Skin & Axial Inspection: No masses, redness, edema, swelling, or associated skin lesions Alignment: Symmetrical Functional ROM: Unrestricted ROM      Stability: No instability detected Muscle Tone/Strength: Functionally intact. No obvious neuro-muscular anomalies detected. Sensory (Neurological): Unimpaired Palpation: No palpable anomalies               Upper Extremity (UE) Exam    Side: Right upper extremity  Side: Left upper extremity  Skin & Extremity Inspection: Skin color, temperature, and hair growth are WNL. No peripheral edema or cyanosis. No masses, redness, swelling, asymmetry, or associated skin lesions. No contractures.  Skin & Extremity Inspection: Skin color, temperature, and hair growth are WNL. No peripheral edema or cyanosis. No masses, redness, swelling, asymmetry, or associated skin lesions. No contractures.  Functional ROM: Unrestricted ROM          Functional ROM: Unrestricted ROM          Muscle Tone/Strength: Functionally intact. No obvious neuro-muscular anomalies detected.  Muscle Tone/Strength: Functionally intact. No obvious neuro-muscular anomalies detected.  Sensory (Neurological): Unimpaired          Sensory (Neurological): Unimpaired          Palpation: No palpable anomalies              Palpation: No palpable anomalies              Specialized Test(s): Deferred         Specialized Test(s): Deferred          Thoracic Spine Area Exam  Skin & Axial Inspection: No masses, redness, or swelling Alignment: Symmetrical Functional ROM: Unrestricted ROM Stability: No instability detected Muscle Tone/Strength: Functionally intact. No obvious neuro-muscular anomalies detected. Sensory (Neurological): Unimpaired Muscle strength & Tone: No palpable anomalies  Lumbar Spine Area Exam  Skin & Axial Inspection: No masses, redness, or swelling Alignment: Symmetrical Functional ROM: Unrestricted ROM      Stability: No instability detected Muscle Tone/Strength: Functionally intact. No obvious neuro-muscular anomalies detected. Sensory (Neurological): Unimpaired Palpation: No palpable anomalies       Provocative Tests: Lumbar Hyperextension and rotation test: Improved after  treatment bilaterally for facet joint pain. Lumbar Lateral bending test: evaluation deferred today       Patrick's Maneuver: evaluation deferred today                     Gait & Posture Assessment  Ambulation: Unassisted Gait: Relatively normal for age and body habitus Posture: WNL   Lower Extremity Exam    Side: Right lower extremity  Side: Left lower extremity  Skin & Extremity Inspection: Skin color, temperature, and hair growth are WNL. No peripheral edema or cyanosis. No masses, redness, swelling, asymmetry, or associated skin lesions. No contractures.  Skin & Extremity Inspection: Skin color, temperature, and hair growth are WNL. No peripheral edema or cyanosis. No masses, redness, swelling, asymmetry, or associated skin lesions. No contractures.  Functional ROM: Unrestricted ROM          Functional ROM: Unrestricted ROM          Muscle Tone/Strength: Functionally intact. No obvious neuro-muscular anomalies detected.  Muscle Tone/Strength: Functionally intact. No obvious neuro-muscular anomalies detected.  Sensory (Neurological): Unimpaired  Sensory (Neurological): Unimpaired  Palpation: No palpable anomalies  Palpation: No palpable anomalies   Assessment  Primary Diagnosis & Pertinent Problem List: The primary encounter diagnosis was Lumbar spondylosis. Diagnoses of Lumbar degenerative disc disease and Lumbar facet arthropathy were also pertinent to this visit.  Status Diagnosis  Responding Responding Responding 1. Lumbar spondylosis   2. Lumbar degenerative disc disease   3. Lumbar facet arthropathy      61 year old gentleman with a history of hypertension, hyperlipidemia, history of cellulitis and left anterior leg who presents withaxial low back pain with radiation to bilateral legs for many years which has worsened over the last 2-3 years. Patient describes pain as aching and throbbing which is remains in his lower back and buttock region. It rarely extends below his knees. Patient also deals with Achilles tendinosis which which results in bilateral ankle pain as well. Patient has not tried any interventional therapies  including lumbar facet blocks or epidural steroid injections. He only takes naproxen 500 mg twice a day for his low back. Patient has had nerve conduction studies performed along with EMG which showed chronic lumbar polyradiculopathy along with severe lower extremity sensory polyneuropathy.  Patient's axial low back pain with radiation to bilateral buttocks and posterior thighs to be secondary to lumbar spondylosis as upported by lumbar MRI and physical exam findings of pain with lumbar extension and lateral rotation as well as pain overlying the lumbar facets to deep palpation. Patient states that he has tried to do physical therapy exercises at home that he found online was unable to do them secondary to pain.   Patient is status post diagnostic bilateral lumbar facet blocks on 01/29/2017, 03/05/2017 which he states were effective for his axial low back and buttock symptoms for 2-3 days after the block.  Patient endorses greater than 50% pain relief and improve range of motion significantly lumbar extension and lateral rotation for 1-2 days after the block.  This suggests a positive diagnostic block x2 and we will proceed with radiofrequency ablation starting with the right side first.  Plan: -Right L3-L5 radiofrequency ablation followed by left.  Procedure to be done under sedation. -After radiofrequency ablation of lumbar spine, will obtain cervical MRI the patient continues to endorse neck pain and upper extremity symptoms and consider cervical epidural versus cervical diagnostic facet blocks.   Provider-requested follow-up: Return in about 5 weeks (around 05/08/2017) for Procedure.  Future  Appointments  Date Time Provider Department Center  04/17/2017  2:00 PM Jomarie Longs, MD [facility] None  05/14/2017  9:00 AM Edward Jolly, MD [facility] None    Primary Care Physician: Orene Desanctis, MD Location: [facility] Note by: Edward Jolly, M.D Date: 04/03/2017;  Time: 1:54 PM  Patient Instructions   1. Schedule for right lumbar ablation Pain Management Discharge Instructions  General Discharge Instructions :  If you need to reach your doctor call: Monday-Friday 8:00 am - 4:00 pm at [phone] or toll free [phone].  After clinic hours [phone] to have operator reach doctor.  Bring all of your medication bottles to all your appointments in the pain clinic.  To cancel or reschedule your appointment with Pain Management please remember to call 24 hours in advance to avoid a fee.  Refer to the educational materials which you have been given on: General Risks, I had my Procedure. Discharge Instructions, Post Sedation.  Post Procedure Instructions:  The drugs you were given will stay in your system until tomorrow, so for the next 24 hours you should not drive, make any legal decisions or drink any alcoholic beverages.  You may eat anything you prefer, but it is better to start with liquids then soups and crackers, and gradually work up to solid foods.  Please notify your doctor immediately if you have any unusual bleeding, trouble breathing or pain that is not related to your normal pain.  Depending on the type of procedure that was done, some parts of your body may feel week and/or numb.  This usually clears up by tonight or the next day.  Walk with the use of an assistive device or accompanied by an adult for the 24 hours.  You may use ice on the affected area for the first 24 hours.  Put ice in a Ziploc bag and cover with a towel and place against area 15 minutes on 15 minutes off.  You may switch to heat after 24 hours.GENERAL RISKS AND COMPLICATIONS  What are the risk, side effects and possible complications? Generally speaking, most procedures are safe.  However, with any procedure there are risks, side effects, and the possibility of complications.  The risks and complications are dependent upon the sites that are lesioned, or  the type of nerve block to be performed.  The closer the procedure is to the spine, the more serious the risks are.  Great care is taken when placing the radio frequency needles, block needles or lesioning probes, but sometimes complications can occur. 1. Infection: Any time there is an injection through the skin, there is a risk of infection.  This is why sterile conditions are used for these blocks.  There are four possible types of infection. 1. Localized skin infection. 2. Central Nervous System Infection-This can be in the form of Meningitis, which can be deadly. 3. Epidural Infections-This can be in the form of an epidural abscess, which can cause pressure inside of the spine, causing compression of the spinal cord with subsequent paralysis. This would require an emergency surgery to decompress, and there are no guarantees that the patient would recover from the paralysis. 4. Discitis-This is an infection of the intervertebral discs.  It occurs in about 1% of discography procedures.  It is difficult to treat and it may lead to surgery.        2. Pain: the needles have to go through skin and soft tissues, will cause soreness.  3. Damage to internal structures:  The nerves to be lesioned may be near blood vessels or    other nerves which can be potentially damaged.       4. Bleeding: Bleeding is more common if the patient is taking blood thinners such as  aspirin, Coumadin, Ticiid, Plavix, etc., or if he/she have some genetic predisposition  such as hemophilia. Bleeding into the spinal canal can cause compression of the spinal  cord with subsequent paralysis.  This would require an emergency surgery to  decompress and there are no guarantees that the patient would recover from the  paralysis.       5. Pneumothorax:  Puncturing of a lung is a possibility, every time a needle is introduced in  the area of the chest or upper back.  Pneumothorax refers to free air around the  collapsed lung(s),  inside of the thoracic cavity (chest cavity).  Another two possible  complications related to a similar event would include: Hemothorax and Chylothorax.   These are variations of the Pneumothorax, where instead of air around the collapsed  lung(s), you may have blood or chyle, respectively.       6. Spinal headaches: They may occur with any procedures in the area of the spine.       7. Persistent CSF (Cerebro-Spinal Fluid) leakage: This is a rare problem, but may occur  with prolonged intrathecal or epidural catheters either due to the formation of a fistulous  track or a dural tear.       8. Nerve damage: By working so close to the spinal cord, there is always a possibility of  nerve damage, which could be as serious as a permanent spinal cord injury with  paralysis.       9. Death:  Although rare, severe deadly allergic reactions known as "Anaphylactic  reaction" can occur to any of the medications used.      10. Worsening of the symptoms:  We can always make thing worse.  What are the chances of something like this happening? Chances of any of this occuring are extremely low.  By statistics, you have more of a chance of getting killed in a motor vehicle accident: while driving to the hospital than any of the above occurring .  Nevertheless, you should be aware that they are possibilities.  In general, it is similar to taking a shower.  Everybody knows that you can slip, hit your head and get killed.  Does that mean that you should not shower again?  Nevertheless always keep in mind that statistics do not mean anything if you happen to be on the wrong side of them.  Even if a procedure has a 1 (one) in a 1,000,000 (million) chance of going wrong, it you happen to be that one..Also, keep in mind that by statistics, you have more of a chance of having something go wrong when taking medications.  Who should not have this procedure? If you are on a blood thinning medication (e.g. Coumadin, Plavix, see list  of "Blood Thinners"), or if you have an active infection going on, you should not have the procedure.  If you are taking any blood thinners, please inform your physician.  How should I prepare for this procedure?  Do not eat or drink anything at least six hours prior to the procedure.  Bring a driver with you .  It cannot be a taxi.  Come accompanied by an adult that can drive you back, and  that is strong enough to help you if your legs get weak or numb from the local anesthetic.  Take all of your medicines the morning of the procedure with just enough water to swallow them.  If you have diabetes, make sure that you are scheduled to have your procedure done first thing in the morning, whenever possible.  If you have diabetes, take only half of your insulin dose and notify our nurse that you have done so as soon as you arrive at the clinic.  If you are diabetic, but only take blood sugar pills (oral hypoglycemic), then do not take them on the morning of your procedure.  You may take them after you have had the procedure.  Do not take aspirin or any aspirin-containing medications, at least eleven (11) days prior to the procedure.  They may prolong bleeding.  Wear loose fitting clothing that may be easy to take off and that you would not mind if it got stained with Betadine or blood.  Do not wear any jewelry or perfume  Remove any nail coloring.  It will interfere with some of our monitoring equipment.  NOTE: Remember that this is not meant to be interpreted as a complete list of all possible complications.  Unforeseen problems may occur.  BLOOD THINNERS The following drugs contain aspirin or other products, which can cause increased bleeding during surgery and should not be taken for 2 weeks prior to and 1 week after surgery.  If you should need take something for relief of minor pain, you may take acetaminophen which is found in Tylenol,m Datril, Anacin-3 and Panadol. It is not blood  thinner. The products listed below are.  Do not take any of the products listed below in addition to any listed on your instruction sheet.  A.P.C or A.P.C with Codeine Codeine Phosphate Capsules #3 Ibuprofen Ridaura  ABC compound Congesprin Imuran rimadil  Advil Cope Indocin Robaxisal  Alka-Seltzer Effervescent Pain Reliever and Antacid Coricidin or Coricidin-D  Indomethacin Rufen  Alka-Seltzer plus Cold Medicine Cosprin Ketoprofen S-A-C Tablets  Anacin Analgesic Tablets or Capsules Coumadin Korlgesic Salflex  Anacin Extra Strength Analgesic tablets or capsules CP-2 Tablets Lanoril Salicylate  Anaprox Cuprimine Capsules Levenox Salocol  Anexsia-D Dalteparin Magan Salsalate  Anodynos Darvon compound Magnesium Salicylate Sine-off  Ansaid Dasin Capsules Magsal Sodium Salicylate  Anturane Depen Capsules Marnal Soma  APF Arthritis pain formula Dewitt's Pills Measurin Stanback  Argesic Dia-Gesic Meclofenamic Sulfinpyrazone  Arthritis Bayer Timed Release Aspirin Diclofenac Meclomen Sulindac  Arthritis pain formula Anacin Dicumarol Medipren Supac  Analgesic (Safety coated) Arthralgen Diffunasal Mefanamic Suprofen  Arthritis Strength Bufferin Dihydrocodeine Mepro Compound Suprol  Arthropan liquid Dopirydamole Methcarbomol with Aspirin Synalgos  ASA tablets/Enseals Disalcid Micrainin Tagament  Ascriptin Doan's Midol Talwin  Ascriptin A/D Dolene Mobidin Tanderil  Ascriptin Extra Strength Dolobid Moblgesic Ticlid  Ascriptin with Codeine Doloprin or Doloprin with Codeine Momentum Tolectin  Asperbuf Duoprin Mono-gesic Trendar  Aspergum Duradyne Motrin or Motrin IB Triminicin  Aspirin plain, buffered or enteric coated Durasal Myochrisine Trigesic  Aspirin Suppositories Easprin Nalfon Trillsate  Aspirin with Codeine Ecotrin Regular or Extra Strength Naprosyn Uracel  Atromid-S Efficin Naproxen Ursinus  Auranofin Capsules Elmiron Neocylate Vanquish  Axotal Emagrin Norgesic Verin  Azathioprine  Empirin or Empirin with Codeine Normiflo Vitamin E  Azolid Emprazil Nuprin Voltaren  Bayer Aspirin plain, buffered or children's or timed BC Tablets or powders Encaprin Orgaran Warfarin Sodium  Buff-a-Comp Enoxaparin Orudis Zorpin  Buff-a-Comp with Codeine Equegesic Os-Cal-Gesic   Buffaprin Excedrin plain, buffered  or Extra Strength Oxalid   Bufferin Arthritis Strength Feldene Oxphenbutazone   Bufferin plain or Extra Strength Feldene Capsules Oxycodone with Aspirin   Bufferin with Codeine Fenoprofen Fenoprofen Pabalate or Pabalate-SF   Buffets II Flogesic Panagesic   Buffinol plain or Extra Strength Florinal or Florinal with Codeine Panwarfarin   Buf-Tabs Flurbiprofen Penicillamine   Butalbital Compound Four-way cold tablets Penicillin   Butazolidin Fragmin Pepto-Bismol   Carbenicillin Geminisyn Percodan   Carna Arthritis Reliever Geopen Persantine   Carprofen Gold's salt Persistin   Chloramphenicol Goody's Phenylbutazone   Chloromycetin Haltrain Piroxlcam   Clmetidine heparin Plaquenil   Cllnoril Hyco-pap Ponstel   Clofibrate Hydroxy chloroquine Propoxyphen         Before stopping any of these medications, be sure to consult the physician who ordered them.  Some, such as Coumadin (Warfarin) are ordered to prevent or treat serious conditions such as "deep thrombosis", "pumonary embolisms", and other heart problems.  The amount of time that you may need off of the medication may also vary with the medication and the reason for which you were taking it.  If you are taking any of these medications, please make sure you notify your pain physician before you undergo any procedures.         Radiofrequency Lesioning Radiofrequency lesioning is a procedure that is performed to relieve pain. The procedure is often used for back, neck, or arm pain. Radiofrequency lesioning involves the use of a machine that creates radio waves to make heat. During the procedure, the heat is applied to the  nerve that carries the pain signal. The heat damages the nerve and interferes with the pain signal. Pain relief usually starts about 2 weeks after the procedure and lasts for 6 months to 1 year. Tell a health care provider about:  Any allergies you have.  All medicines you are taking, including vitamins, herbs, eye drops, creams, and over-the-counter medicines.  Any problems you or family members have had with anesthetic medicines.  Any blood disorders you have.  Any surgeries you have had.  Any medical conditions you have.  Whether you are pregnant or may be pregnant. What are the risks? Generally, this is a safe procedure. However, problems may occur, including:  Pain or soreness at the injection site.  Infection at the injection site.  Damage to nerves or blood vessels.  What happens before the procedure?  Ask your health care provider about: ? Changing or stopping your regular medicines. This is especially important if you are taking diabetes medicines or blood thinners. ? Taking medicines such as aspirin and ibuprofen. These medicines can thin your blood. Do not take these medicines before your procedure if your health care provider instructs you not to.  Follow instructions from your health care provider about eating or drinking restrictions.  Plan to have someone take you home after the procedure.  If you go home right after the procedure, plan to have someone with you for 24 hours. What happens during the procedure?  You will be given one or more of the following: ? A medicine to help you relax (sedative). ? A medicine to numb the area (local anesthetic).  You will be awake during the procedure. You will need to be able to talk with the health care provider during the procedure.  With the help of a type of X-ray (fluoroscopy), the health care provider will insert a radiofrequency needle into the area to be treated.  Next, a wire that carries the radio  waves  (electrode) will be put through the radiofrequency needle. An electrical pulse will be sent through the electrode to verify the correct nerve. You will feel a tingling sensation, and you may have muscle twitching.  Then, the tissue that is around the needle tip will be heated by an electric current that is passed using the radiofrequency machine. This will numb the nerves.  A bandage (dressing) will be put on the insertion area after the procedure is done. The procedure may vary among health care providers and hospitals. What happens after the procedure?  Your blood pressure, heart rate, breathing rate, and blood oxygen level will be monitored often until the medicines you were given have worn off.  Return to your normal activities as directed by your health care provider. This information is not intended to replace advice given to you by your health care provider. Make sure you discuss any questions you have with your health care provider. Document Released: 12/12/2010 Document Revised: 09/21/2015 Document Reviewed: 05/23/2014 Elsevier Interactive Patient Education  [employer].

## 2017-04-03 NOTE — Patient Instructions (Addendum)
1. Schedule for right lumbar ablation Pain Management Discharge Instructions  General Discharge Instructions :  If you need to reach your doctor call: Monday-Friday 8:00 am - 4:00 pm at [phone] or toll free [phone].  After clinic hours [phone] to have operator reach doctor.  Bring all of your medication bottles to all your appointments in the pain clinic.  To cancel or reschedule your appointment with Pain Management please remember to call 24 hours in advance to avoid a fee.  Refer to the educational materials which you have been given on: General Risks, I had my Procedure. Discharge Instructions, Post Sedation.  Post Procedure Instructions:  The drugs you were given will stay in your system until tomorrow, so for the next 24 hours you should not drive, make any legal decisions or drink any alcoholic beverages.  You may eat anything you prefer, but it is better to start with liquids then soups and crackers, and gradually work up to solid foods.  Please notify your doctor immediately if you have any unusual bleeding, trouble breathing or pain that is not related to your normal pain.  Depending on the type of procedure that was done, some parts of your body may feel week and/or numb.  This usually clears up by tonight or the next day.  Walk with the use of an assistive device or accompanied by an adult for the 24 hours.  You may use ice on the affected area for the first 24 hours.  Put ice in a Ziploc bag and cover with a towel and place against area 15 minutes on 15 minutes off.  You may switch to heat after 24 hours.GENERAL RISKS AND COMPLICATIONS  What are the risk, side effects and possible complications? Generally speaking, most procedures are safe.  However, with any procedure there are risks, side effects, and the possibility of complications.  The risks and complications are dependent upon the sites that are lesioned, or the type of nerve block to be performed.   The closer the procedure is to the spine, the more serious the risks are.  Great care is taken when placing the radio frequency needles, block needles or lesioning probes, but sometimes complications can occur. 1. Infection: Any time there is an injection through the skin, there is a risk of infection.  This is why sterile conditions are used for these blocks.  There are four possible types of infection. 1. Localized skin infection. 2. Central Nervous System Infection-This can be in the form of Meningitis, which can be deadly. 3. Epidural Infections-This can be in the form of an epidural abscess, which can cause pressure inside of the spine, causing compression of the spinal cord with subsequent paralysis. This would require an emergency surgery to decompress, and there are no guarantees that the patient would recover from the paralysis. 4. Discitis-This is an infection of the intervertebral discs.  It occurs in about 1% of discography procedures.  It is difficult to treat and it may lead to surgery.        2. Pain: the needles have to go through skin and soft tissues, will cause soreness.       3. Damage to internal structures:  The nerves to be lesioned may be near blood vessels or    other nerves which can be potentially damaged.       4. Bleeding: Bleeding is more common if the patient is taking blood thinners such as  aspirin, Coumadin, Ticiid, Plavix, etc., or if he/she have  some genetic predisposition  such as hemophilia. Bleeding into the spinal canal can cause compression of the spinal  cord with subsequent paralysis.  This would require an emergency surgery to  decompress and there are no guarantees that the patient would recover from the  paralysis.       5. Pneumothorax:  Puncturing of a lung is a possibility, every time a needle is introduced in  the area of the chest or upper back.  Pneumothorax refers to free air around the  collapsed lung(s), inside of the thoracic cavity (chest cavity).   Another two possible  complications related to a similar event would include: Hemothorax and Chylothorax.   These are variations of the Pneumothorax, where instead of air around the collapsed  lung(s), you may have blood or chyle, respectively.       6. Spinal headaches: They may occur with any procedures in the area of the spine.       7. Persistent CSF (Cerebro-Spinal Fluid) leakage: This is a rare problem, but may occur  with prolonged intrathecal or epidural catheters either due to the formation of a fistulous  track or a dural tear.       8. Nerve damage: By working so close to the spinal cord, there is always a possibility of  nerve damage, which could be as serious as a permanent spinal cord injury with  paralysis.       9. Death:  Although rare, severe deadly allergic reactions known as "Anaphylactic  reaction" can occur to any of the medications used.      10. Worsening of the symptoms:  We can always make thing worse.  What are the chances of something like this happening? Chances of any of this occuring are extremely low.  By statistics, you have more of a chance of getting killed in a motor vehicle accident: while driving to the hospital than any of the above occurring .  Nevertheless, you should be aware that they are possibilities.  In general, it is similar to taking a shower.  Everybody knows that you can slip, hit your head and get killed.  Does that mean that you should not shower again?  Nevertheless always keep in mind that statistics do not mean anything if you happen to be on the wrong side of them.  Even if a procedure has a 1 (one) in a 1,000,000 (million) chance of going wrong, it you happen to be that one..Also, keep in mind that by statistics, you have more of a chance of having something go wrong when taking medications.  Who should not have this procedure? If you are on a blood thinning medication (e.g. Coumadin, Plavix, see list of "Blood Thinners"), or if you have an active  infection going on, you should not have the procedure.  If you are taking any blood thinners, please inform your physician.  How should I prepare for this procedure?  Do not eat or drink anything at least six hours prior to the procedure.  Bring a driver with you .  It cannot be a taxi.  Come accompanied by an adult that can drive you back, and that is strong enough to help you if your legs get weak or numb from the local anesthetic.  Take all of your medicines the morning of the procedure with just enough water to swallow them.  If you have diabetes, make sure that you are scheduled to have your procedure done first thing in the morning, whenever  possible.  If you have diabetes, take only half of your insulin dose and notify our nurse that you have done so as soon as you arrive at the clinic.  If you are diabetic, but only take blood sugar pills (oral hypoglycemic), then do not take them on the morning of your procedure.  You may take them after you have had the procedure.  Do not take aspirin or any aspirin-containing medications, at least eleven (11) days prior to the procedure.  They may prolong bleeding.  Wear loose fitting clothing that may be easy to take off and that you would not mind if it got stained with Betadine or blood.  Do not wear any jewelry or perfume  Remove any nail coloring.  It will interfere with some of our monitoring equipment.  NOTE: Remember that this is not meant to be interpreted as a complete list of all possible complications.  Unforeseen problems may occur.  BLOOD THINNERS The following drugs contain aspirin or other products, which can cause increased bleeding during surgery and should not be taken for 2 weeks prior to and 1 week after surgery.  If you should need take something for relief of minor pain, you may take acetaminophen which is found in Tylenol,m Datril, Anacin-3 and Panadol. It is not blood thinner. The products listed below are.  Do not  take any of the products listed below in addition to any listed on your instruction sheet.  A.P.C or A.P.C with Codeine Codeine Phosphate Capsules #3 Ibuprofen Ridaura  ABC compound Congesprin Imuran rimadil  Advil Cope Indocin Robaxisal  Alka-Seltzer Effervescent Pain Reliever and Antacid Coricidin or Coricidin-D  Indomethacin Rufen  Alka-Seltzer plus Cold Medicine Cosprin Ketoprofen S-A-C Tablets  Anacin Analgesic Tablets or Capsules Coumadin Korlgesic Salflex  Anacin Extra Strength Analgesic tablets or capsules CP-2 Tablets Lanoril Salicylate  Anaprox Cuprimine Capsules Levenox Salocol  Anexsia-D Dalteparin Magan Salsalate  Anodynos Darvon compound Magnesium Salicylate Sine-off  Ansaid Dasin Capsules Magsal Sodium Salicylate  Anturane Depen Capsules Marnal Soma  APF Arthritis pain formula Dewitt's Pills Measurin Stanback  Argesic Dia-Gesic Meclofenamic Sulfinpyrazone  Arthritis Bayer Timed Release Aspirin Diclofenac Meclomen Sulindac  Arthritis pain formula Anacin Dicumarol Medipren Supac  Analgesic (Safety coated) Arthralgen Diffunasal Mefanamic Suprofen  Arthritis Strength Bufferin Dihydrocodeine Mepro Compound Suprol  Arthropan liquid Dopirydamole Methcarbomol with Aspirin Synalgos  ASA tablets/Enseals Disalcid Micrainin Tagament  Ascriptin Doan's Midol Talwin  Ascriptin A/D Dolene Mobidin Tanderil  Ascriptin Extra Strength Dolobid Moblgesic Ticlid  Ascriptin with Codeine Doloprin or Doloprin with Codeine Momentum Tolectin  Asperbuf Duoprin Mono-gesic Trendar  Aspergum Duradyne Motrin or Motrin IB Triminicin  Aspirin plain, buffered or enteric coated Durasal Myochrisine Trigesic  Aspirin Suppositories Easprin Nalfon Trillsate  Aspirin with Codeine Ecotrin Regular or Extra Strength Naprosyn Uracel  Atromid-S Efficin Naproxen Ursinus  Auranofin Capsules Elmiron Neocylate Vanquish  Axotal Emagrin Norgesic Verin  Azathioprine Empirin or Empirin with Codeine Normiflo Vitamin E   Azolid Emprazil Nuprin Voltaren  Bayer Aspirin plain, buffered or children's or timed BC Tablets or powders Encaprin Orgaran Warfarin Sodium  Buff-a-Comp Enoxaparin Orudis Zorpin  Buff-a-Comp with Codeine Equegesic Os-Cal-Gesic   Buffaprin Excedrin plain, buffered or Extra Strength Oxalid   Bufferin Arthritis Strength Feldene Oxphenbutazone   Bufferin plain or Extra Strength Feldene Capsules Oxycodone with Aspirin   Bufferin with Codeine Fenoprofen Fenoprofen Pabalate or Pabalate-SF   Buffets II Flogesic Panagesic   Buffinol plain or Extra Strength Florinal or Florinal with Codeine Panwarfarin   Buf-Tabs Flurbiprofen Penicillamine  Butalbital Compound Four-way cold tablets Penicillin   Butazolidin Fragmin Pepto-Bismol   Carbenicillin Geminisyn Percodan   Carna Arthritis Reliever Geopen Persantine   Carprofen Gold's salt Persistin   Chloramphenicol Goody's Phenylbutazone   Chloromycetin Haltrain Piroxlcam   Clmetidine heparin Plaquenil   Cllnoril Hyco-pap Ponstel   Clofibrate Hydroxy chloroquine Propoxyphen         Before stopping any of these medications, be sure to consult the physician who ordered them.  Some, such as Coumadin (Warfarin) are ordered to prevent or treat serious conditions such as "deep thrombosis", "pumonary embolisms", and other heart problems.  The amount of time that you may need off of the medication may also vary with the medication and the reason for which you were taking it.  If you are taking any of these medications, please make sure you notify your pain physician before you undergo any procedures.         Radiofrequency Lesioning Radiofrequency lesioning is a procedure that is performed to relieve pain. The procedure is often used for back, neck, or arm pain. Radiofrequency lesioning involves the use of a machine that creates radio waves to make heat. During the procedure, the heat is applied to the nerve that carries the pain signal. The heat damages  the nerve and interferes with the pain signal. Pain relief usually starts about 2 weeks after the procedure and lasts for 6 months to 1 year. Tell a health care provider about:  Any allergies you have.  All medicines you are taking, including vitamins, herbs, eye drops, creams, and over-the-counter medicines.  Any problems you or family members have had with anesthetic medicines.  Any blood disorders you have.  Any surgeries you have had.  Any medical conditions you have.  Whether you are pregnant or may be pregnant. What are the risks? Generally, this is a safe procedure. However, problems may occur, including:  Pain or soreness at the injection site.  Infection at the injection site.  Damage to nerves or blood vessels.  What happens before the procedure?  Ask your health care provider about: ? Changing or stopping your regular medicines. This is especially important if you are taking diabetes medicines or blood thinners. ? Taking medicines such as aspirin and ibuprofen. These medicines can thin your blood. Do not take these medicines before your procedure if your health care provider instructs you not to.  Follow instructions from your health care provider about eating or drinking restrictions.  Plan to have someone take you home after the procedure.  If you go home right after the procedure, plan to have someone with you for 24 hours. What happens during the procedure?  You will be given one or more of the following: ? A medicine to help you relax (sedative). ? A medicine to numb the area (local anesthetic).  You will be awake during the procedure. You will need to be able to talk with the health care provider during the procedure.  With the help of a type of X-ray (fluoroscopy), the health care provider will insert a radiofrequency needle into the area to be treated.  Next, a wire that carries the radio waves (electrode) will be put through the radiofrequency needle.  An electrical pulse will be sent through the electrode to verify the correct nerve. You will feel a tingling sensation, and you may have muscle twitching.  Then, the tissue that is around the needle tip will be heated by an electric current that is passed using the radiofrequency  machine. This will numb the nerves.  A bandage (dressing) will be put on the insertion area after the procedure is done. The procedure may vary among health care providers and hospitals. What happens after the procedure?  Your blood pressure, heart rate, breathing rate, and blood oxygen level will be monitored often until the medicines you were given have worn off.  Return to your normal activities as directed by your health care provider. This information is not intended to replace advice given to you by your health care provider. Make sure you discuss any questions you have with your health care provider. Document Released: 12/12/2010 Document Revised: 09/21/2015 Document Reviewed: 05/23/2014 Elsevier Interactive Patient Education  [employer].

## 2017-04-03 NOTE — Progress Notes (Signed)
Safety precautions to be maintained throughout the outpatient stay will include: orient to surroundings, keep bed in low position, maintain call bell within reach at all times, provide assistance with transfer out of bed and ambulation.  

## 2017-04-16 IMAGING — US US ABDOMEN COMPLETE
1 series · 14 of 25 positions shown · non-contrast
Comparison: No recent prior .

CLINICAL DATA: Abdominal pain.

EXAM:
ABDOMEN ULTRASOUND COMPLETE

[Series 1: us abdomen complete · 0.26mm/px · 14 of 90 slices shown]
[im 1/90]
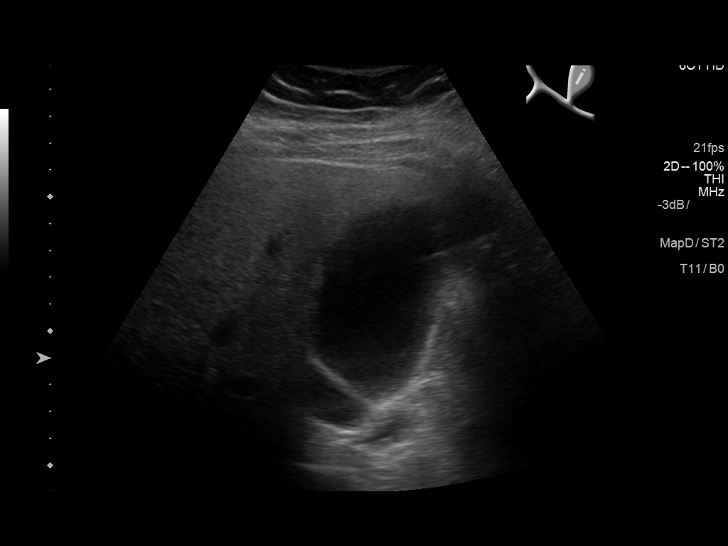
[im 8/90]
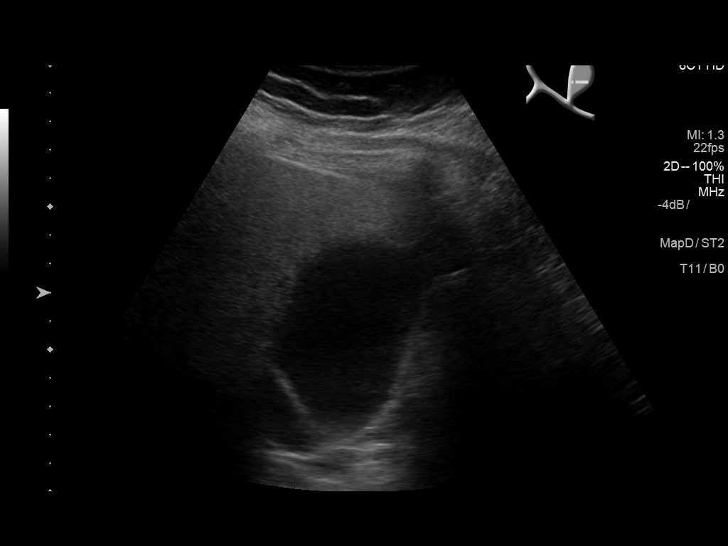
[im 15/90]
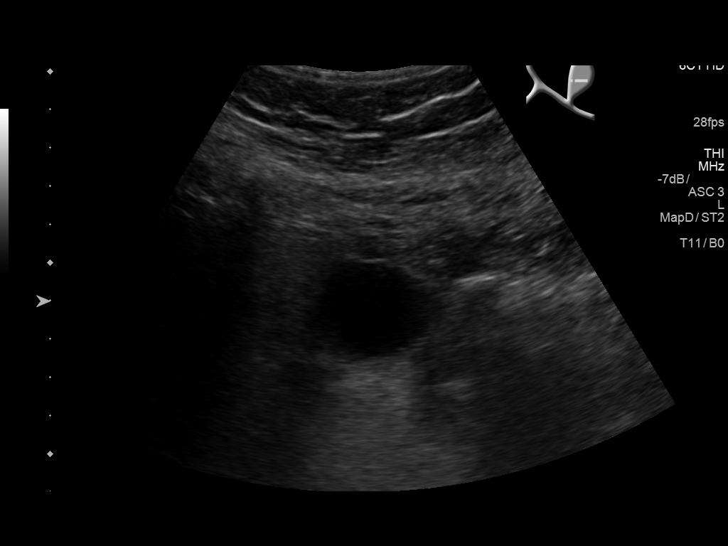
[im 23/90]
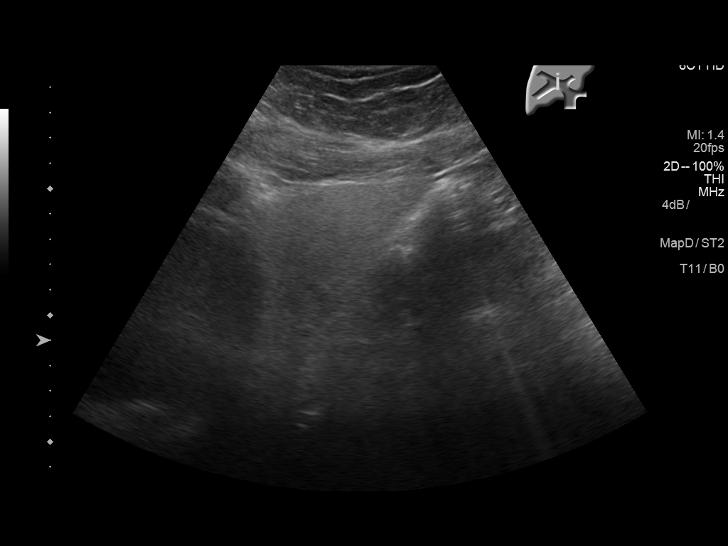
[im 30/90]
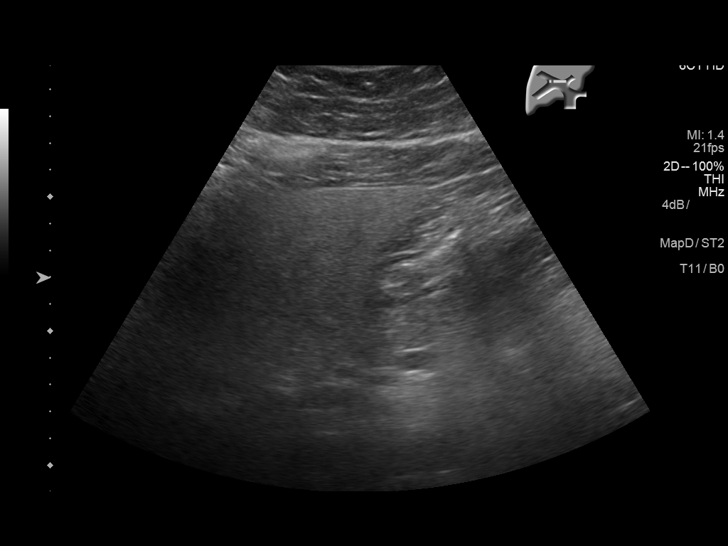
[im 34/90]
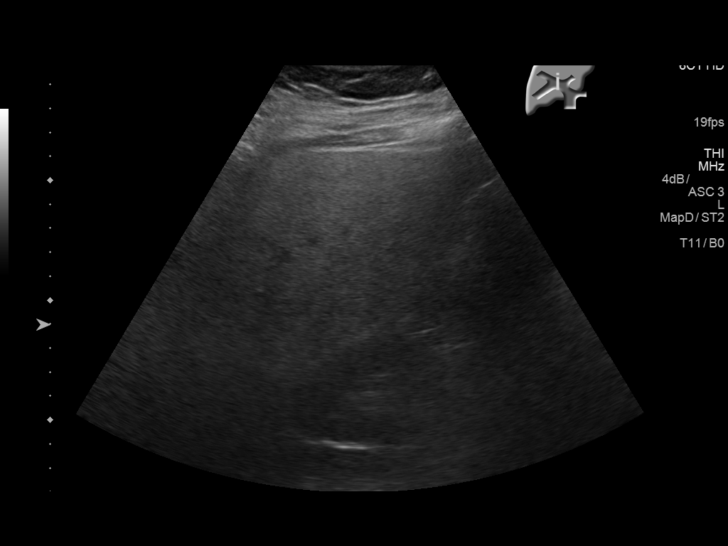
[im 41/90]
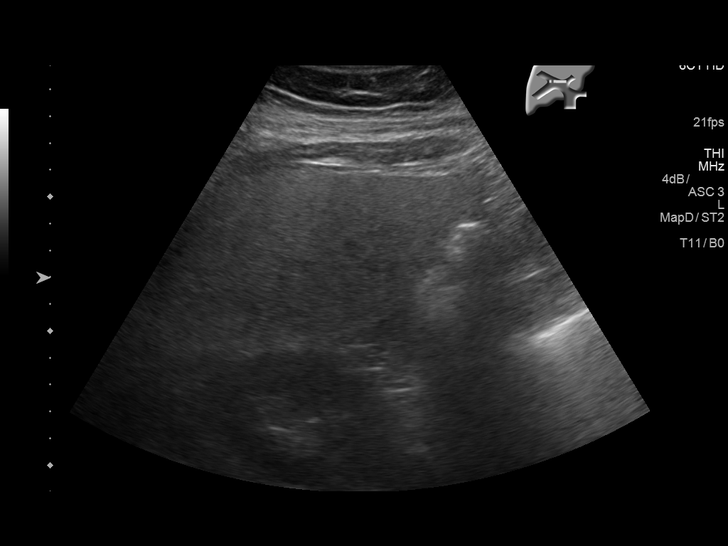
[im 49/90]
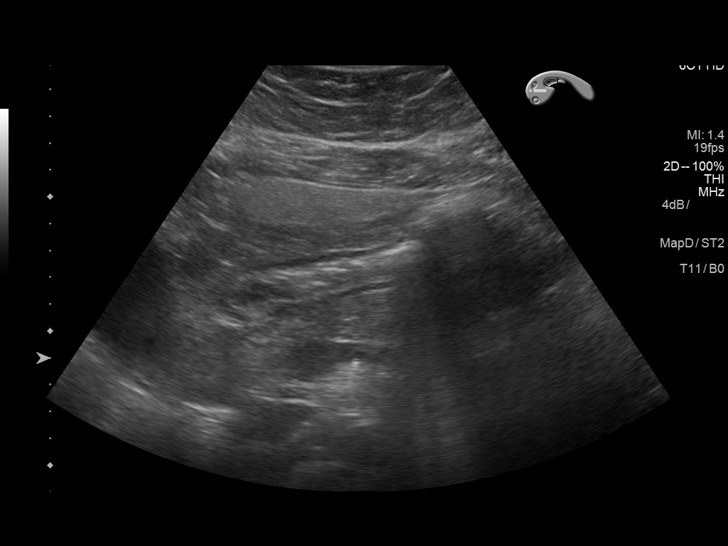
[im 56/90]
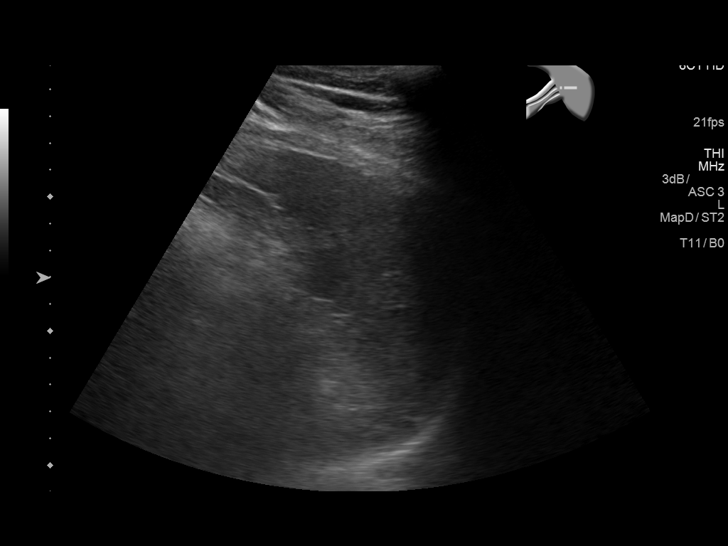
[im 60/90]
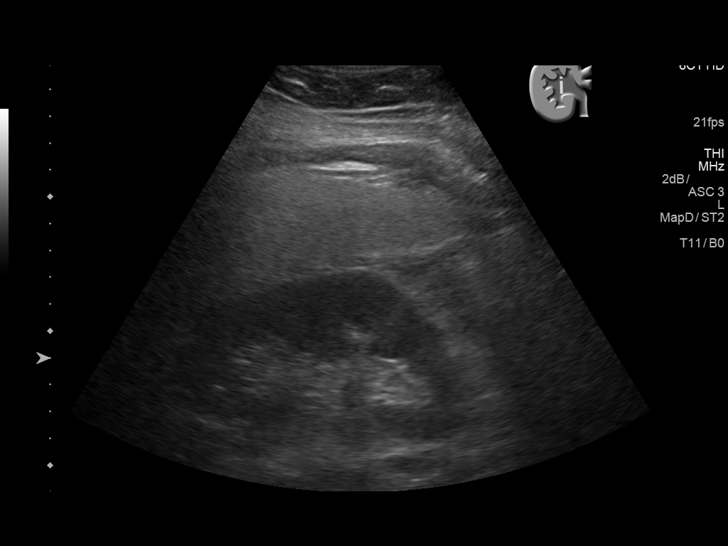
[im 67/90]
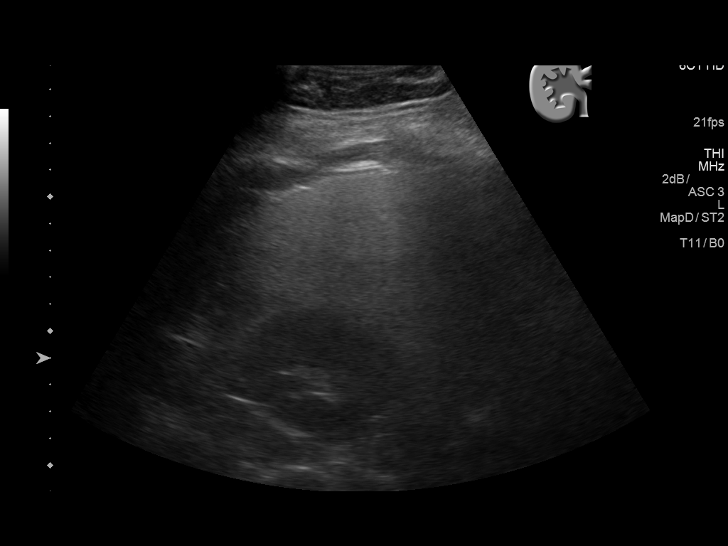
[im 75/90]
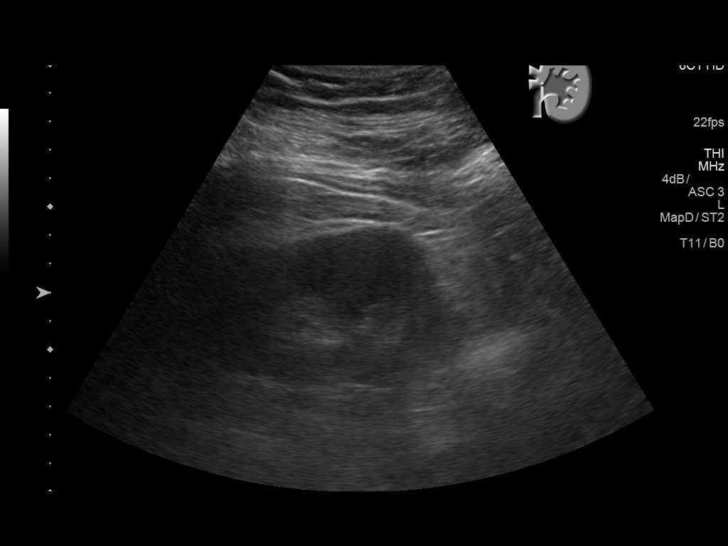
[im 82/90]
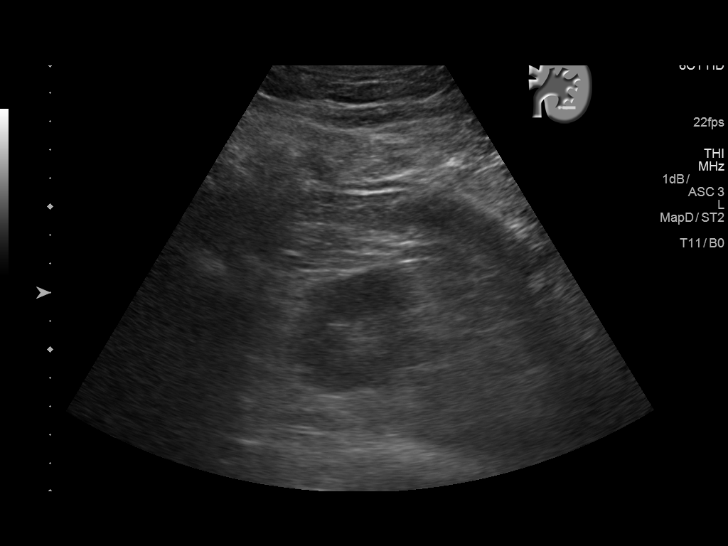
[im 90/90]
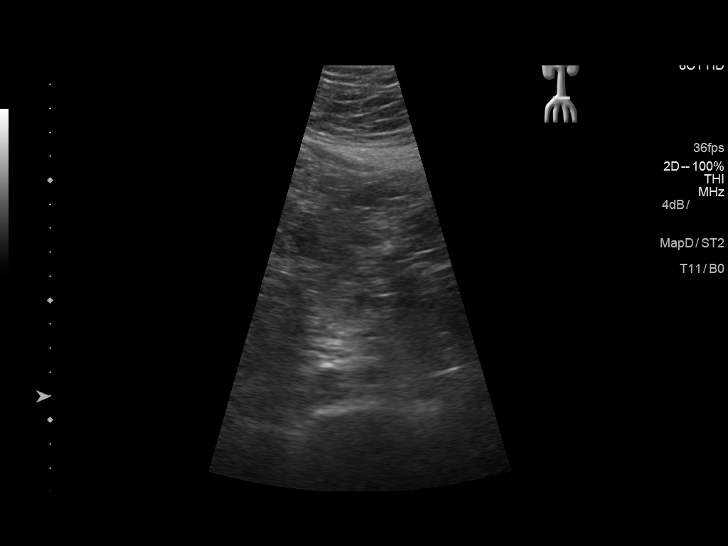

[14 of 25 positions shown; findings below may reference images not displayed]

FINDINGS: Gallbladder: No gallstones or wall thickening visualized. No
sonographic [name] sign noted by sonographer.

Common bile duct: Diameter: 4.0 mm

Liver: Liver is echogenic consistent fatty infiltrate disease.

IVC: No abnormality visualized.

Pancreas: Visualized portion unremarkable.

Spleen: Size and appearance within normal limits.

Right Kidney: Length: 11.2 cm. Echogenicity within normal limits. No
mass or hydronephrosis visualized.

Left Kidney: Length: 11.6 cm. Echogenicity within normal limits. No
mass or hydronephrosis visualized.

Abdominal aorta: No aneurysm visualized.

Other findings: None.
IMPRESSION: Liver is echogenic consistent fatty infiltration and/or
hepatocellular disease. No acute or focal abnormality otherwise
noted.

## 2017-04-17 ENCOUNTER — Ambulatory Visit: Payer: Self-pay | Admitting: Psychiatry

## 2017-04-24 ENCOUNTER — Ambulatory Visit (INDEPENDENT_AMBULATORY_CARE_PROVIDER_SITE_OTHER): Payer: Medicare Other | Admitting: Psychiatry

## 2017-04-24 ENCOUNTER — Encounter: Payer: Self-pay | Admitting: Psychiatry

## 2017-04-24 ENCOUNTER — Other Ambulatory Visit: Payer: Self-pay

## 2017-04-24 VITALS — BP 162/88 | HR 85 | Temp 97.7°F | Wt 265.6 lb

## 2017-04-24 DIAGNOSIS — F330 Major depressive disorder, recurrent, mild: Secondary | ICD-10-CM | POA: Diagnosis not present

## 2017-04-24 DIAGNOSIS — F410 Panic disorder [episodic paroxysmal anxiety] without agoraphobia: Secondary | ICD-10-CM

## 2017-04-24 DIAGNOSIS — F640 Transsexualism: Secondary | ICD-10-CM | POA: Diagnosis not present

## 2017-04-24 DIAGNOSIS — F4010 Social phobia, unspecified: Secondary | ICD-10-CM

## 2017-04-24 MED ORDER — VILAZODONE HCL 10 MG PO TABS: 10.0000 mg | ORAL_TABLET | Freq: Every day | ORAL | 2 refills | Status: DC

## 2017-04-24 MED ORDER — TRAZODONE HCL 50 MG PO TABS: 50.0000 mg | ORAL_TABLET | Freq: Every evening | ORAL | 1 refills | Status: DC | PRN

## 2017-04-24 NOTE — Progress Notes (Signed)
BH MD OP Progress Note  04/25/2017 8:56 AM Kyle LordsAlan Blakeley  MRN:  [id number]  Chief Complaint: ' I am ok."  Chief Complaint    Follow-up; Medication Refill     HPI: Kyle Hood is a 61 year old transgender male to male, lives in [place] has a history of depression, panic disorder, social anxiety disorder who presented to the clinic today for a follow-up.  Kyle Hood reports doing well on the medications as prescribed.  Kyle Hood reports some increased anxiety the past couple of weeks because of a Mice problem at home.  Kyle Hood reports taking 1 whole tablet of Klonopin several times a week because of the mice problem.  The mice problem is currently resolved and Kyle Hood reports feeling better.  Kyle Hood is compliant on the other medications the Viibryd as well as the trazodone.  Kyle Hood denies any side effects to those medication except for some mild grogginess in the morning with the trazodone.  Kyle Hood wanted to discuss reincarnation, reports being obsessed with it.  Kyle Hood also reports identifying themselves with an artist who lived several years ago.  Discussed referral for psychotherapy with Kyle DienerAlan, he agrees with plan.  Kyle Hood reports that Christmas went well.  Kyle Hood spend it with a friend.  Denies any new concerns or questions.  Denies any substance abuse problem.   Visit Diagnosis:    ICD-10-CM   1. Panic disorder F41.0   2. MDD (major depressive disorder), recurrent episode, mild (HCC) F33.0 traZODone (DESYREL) 50 MG tablet    Vilazodone HCl (VIIBRYD) 10 MG TABS  3. Social anxiety disorder F40.10   4. 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 F64.0     Past Psychiatric History: Diagnosed with depression and anxiety while in [place], [place] several years ago.  Patient went through a period of homelessness at that time.  Reports went on to a bridge in order to jump off and kill self, however could not do it because of having vertigo.  They did see a counselor in [place] in the past.  In the past has seen providers in [place] as  well as [place].  [facility] [place]  History of past trials of Zoloft, Wellbutrin  Past Medical History:  Past Medical History:  Diagnosis Date  . Abnormal liver function test   . Anxiety   . Arthritis   . Depression   . Fatty liver   . Heart murmur   . Hepatitis B   . Hyperlipidemia   . Hypertension   . Panic attacks     Past Surgical History:  Procedure Laterality Date  . PROSTATE BIOPSY      Family Psychiatric History: Reports brother committed suicide.  Brother had bipolar disorder as well as alcoholism.  Family History:  Family History  Problem Relation Age of Onset  . Cancer Mother        Pancreatic  . Diabetes Mother   . Hypertension Mother   . Varicose Veins Mother   . Alcohol abuse Mother   . Heart disease Father        CABG  . Cancer Father        Jaw  . Aortic aneurysm Father   . Congestive Heart Failure Father   . Hypertension Father   . Varicose Veins Father   . Alcohol abuse Father   . Bipolar disorder Sister   . Bipolar disorder Brother     Substance abuse problem: Denies   Social History: Both parents.  Went up to 11th grade.  Used to live in [place], [place] in the past.  Moved to [place] 4 years ago.  Currently is on SSD for mental and medical problems.  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  Has a companion who stays with them, his name is Publishing rights managerDennis. Social History   Socioeconomic History  . Marital status: Significant Other    Spouse name: denise  . Number of children: 0  . Years of education: None  . Highest education level: 11th grade  Social Needs  . Financial resource strain: Very hard  . Food insecurity - worry: Never true  . Food insecurity - inability: Never true  . Transportation needs - medical: No  . Transportation needs - non-medical: No  Occupational History    Comment: disabled  Tobacco Use  . Smoking status: Never Smoker  . Smokeless tobacco: Never Used  Substance  and Sexual Activity  . Alcohol use: None    Comment: Occasional wine   . Drug use: No  . Sexual activity: Not Currently  Other Topics Concern  . None  Social History Narrative  . None    Allergies:  Allergies  Allergen Reactions  . Lisinopril Cough    Metabolic Disorder Labs: Lab Results  Component Value Date   HGBA1C 6.1 08/16/2014   No results found for: PROLACTIN Lab Results  Component Value Date   CHOL 145 08/16/2014   TRIG 320 (A) 08/16/2014   HDL 36 08/16/2014   LDLCALC 45 08/16/2014   Lab Results  Component Value Date   TSH 1.61 08/16/2014    Therapeutic Level Labs: No results found for: LITHIUM No results found for: VALPROATE No components found for:  CBMZ  Current Medications: Current Outpatient Medications  Medication Sig Dispense Refill  . clonazePAM (KLONOPIN) 0.5 MG tablet Take 1 tablet (0.5 mg total) by mouth daily. As needed 30 tablet 1  . hydrochlorothiazide (HYDRODIURIL) 25 MG tablet Take 25 mg by mouth daily.    . naproxen (NAPROSYN) 500 MG tablet Take 500 mg by mouth 2 (two) times daily with a meal.    . traZODone (DESYREL) 50 MG tablet Take 1 tablet (50 mg total) by mouth at bedtime as needed for sleep. 30 tablet 1  . Vilazodone HCl (VIIBRYD) 10 MG TABS Take 1 tablet (10 mg total) by mouth daily. 30 tablet 2   No current facility-administered medications for this visit.      Musculoskeletal: Strength & Muscle Tone: within normal limits Gait & Station: normal Patient leans: N/A  Psychiatric Specialty Exam: Review of Systems  Psychiatric/Behavioral: The patient is nervous/anxious.   All other systems reviewed and are negative.   Blood pressure (!) 162/88, pulse 85, temperature 97.7 F (36.5 C), temperature source Oral, weight 265 lb 9.6 oz (120.5 kg).Body mass index is 34.1 kg/m.  General Appearance: Casual  Eye Contact:  Fair  Speech:  Clear and Coherent  Volume:  Normal  Mood:  Anxious  Affect:  Congruent  Thought Process:   Goal Directed and Descriptions of Associations: Intact  Orientation:  Full (Time, Place, and Person)  Thought Content: Logical   Suicidal Thoughts:  No  Homicidal Thoughts:  No  Memory:  Immediate;   Fair Recent;   Fair Remote;   Fair  Judgement:  Fair  Insight:  Fair  Psychomotor Activity:  Normal  Concentration:  Concentration: Fair and Attention Span: Fair  Recall:  [employer]Fair  Fund of Knowledge: Fair  Language: Fair  Akathisia:  No  Handed:  Right  AIMS (if indicated): na  Assets:  Communication Skills Desire for Improvement Financial Resources/Insurance Housing Physical Health Resilience Social Support Talents/Skills Transportation Vocational/Educational  ADL's:  Intact  Cognition: WNL  Sleep:  Fair   Screenings: PHQ2-9     Office Visit from 04/03/2017 in [facility] Procedure visit from 03/05/2017 in [facility] Office Visit from 02[facility]13/2017 in [facility] Procedure visit from 01/29/2017 in [facility] Office Visit from 01/21/2017 in [facility]  PHQ-2 Total Score  0  0  0  0  0       Assessment and Plan: Tripp is a 61 year old male to male transgender, with history of depression and anxiety, presented to the clinic for a follow-up visit.  Reyn is currently doing well on the medication. Dragan did have some triggers which made the anxiety symptoms worse the past couple of weeks however currently it is resolved.Wallice denies any suicidality at this time.  Geovonni does have chronic pain symptoms which are currently under control.  Will continue plan as noted below.  Plan Depression Continue Viibryd 10 mg p.o. daily  For anxiety symptoms Viibryd 10 mg p.o. daily Klonopin 0.25-0.5 mg p.o. daily as needed, discussed the risk of being on benzodiazepines, discussed to  continue to restricted.  Insomnia Trazodone 50 mg p.o. nightly as needed  Referred to Ms. Peacock for CBT.  Follow up with PMD for elevated BP.  Follow-up in 6 weeks or sooner if needed.  More than 50 % of the time was spent for psychoeducation and supportive psychotherapy and care coordination.  This note was generated in part or whole with voice recognition software. Voice recognition is usually quite accurate but there are transcription errors that can and very often do occur. I apologize for any typographical errors that were not detected and corrected.       Jomarie Longs, MD 04/25/2017, 8:56 AM

## 2017-04-25 ENCOUNTER — Encounter: Payer: Self-pay | Admitting: Psychiatry

## 2017-05-03 ENCOUNTER — Emergency Department
Admission: EM | Admit: 2017-05-03 | Discharge: 2017-05-03 | Disposition: A | Discharge status: Home / Self Care | Payer: Medicare Other | Attending: Emergency Medicine | Admitting: Emergency Medicine

## 2017-05-03 ENCOUNTER — Emergency Department: Payer: Medicare Other

## 2017-05-03 ENCOUNTER — Encounter: Payer: Self-pay | Admitting: Emergency Medicine

## 2017-05-03 DIAGNOSIS — R42 Dizziness and giddiness: Secondary | ICD-10-CM | POA: Diagnosis not present

## 2017-05-03 DIAGNOSIS — Z79899 Other long term (current) drug therapy: Secondary | ICD-10-CM | POA: Diagnosis not present

## 2017-05-03 DIAGNOSIS — F329 Major depressive disorder, single episode, unspecified: Secondary | ICD-10-CM | POA: Diagnosis not present

## 2017-05-03 DIAGNOSIS — R112 Nausea with vomiting, unspecified: Secondary | ICD-10-CM | POA: Diagnosis present

## 2017-05-03 DIAGNOSIS — I10 Essential (primary) hypertension: Secondary | ICD-10-CM | POA: Diagnosis not present

## 2017-05-03 DIAGNOSIS — F419 Anxiety disorder, unspecified: Secondary | ICD-10-CM | POA: Diagnosis not present

## 2017-05-03 HISTORY — DX: Dizziness and giddiness: R42

## 2017-05-03 LAB — CBC
HEMATOCRIT: 43.2 % (ref 40.0–52.0)
HEMOGLOBIN: 15.1 g/dL (ref 13.0–18.0)
MCH: 34.1 pg — ABNORMAL HIGH (ref 26.0–34.0)
MCHC: 35 g/dL (ref 32.0–36.0)
MCV: 97.2 fL (ref 80.0–100.0)
Platelets: 258 10*3/uL (ref 150–440)
RBC: 4.44 MIL/uL (ref 4.40–5.90)
RDW: 12.7 % (ref 11.5–14.5)
WBC: 10.4 10*3/uL (ref 3.8–10.6)

## 2017-05-03 LAB — COMPREHENSIVE METABOLIC PANEL
ALBUMIN: 4.3 g/dL (ref 3.5–5.0)
ALT: 30 U/L (ref 17–63)
ANION GAP: 11 (ref 5–15)
AST: 35 U/L (ref 15–41)
Alkaline Phosphatase: 65 U/L (ref 38–126)
BILIRUBIN TOTAL: 0.6 mg/dL (ref 0.3–1.2)
BUN: 17 mg/dL (ref 6–20)
CHLORIDE: 102 mmol/L (ref 101–111)
CO2: 24 mmol/L (ref 22–32)
Calcium: 8.8 mg/dL — ABNORMAL LOW (ref 8.9–10.3)
Creatinine, Ser: 0.94 mg/dL (ref 0.61–1.24)
GFR calc Af Amer: >60 mL/min (ref >60)
Glucose, Bld: 129 mg/dL — ABNORMAL HIGH (ref 65–99)
POTASSIUM: 3.2 mmol/L — AB (ref 3.5–5.1)
Sodium: 137 mmol/L (ref 135–145)
TOTAL PROTEIN: 7.1 g/dL (ref 6.5–8.1)

## 2017-05-03 LAB — LIPASE, BLOOD: LIPASE: 30 U/L (ref 11–51)

## 2017-05-03 MED ORDER — MECLIZINE HCL 25 MG PO TABS: 25.0000 mg | ORAL_TABLET | Freq: Three times a day (TID) | ORAL | 0 refills | Status: DC | PRN

## 2017-05-03 MED ORDER — ONDANSETRON HCL 4 MG/2ML IJ SOLN
4.0000 mg | Freq: Once | INTRAMUSCULAR | Status: AC | PRN
  Administered 2017-05-03: 4 mg via INTRAVENOUS
  Filled 2017-05-03: qty 2

## 2017-05-03 MED ORDER — SODIUM CHLORIDE 0.9 % IV SOLN
1000.0000 mL | Freq: Once | INTRAVENOUS | Status: AC
  Administered 2017-05-03: 1000 mL via INTRAVENOUS

## 2017-05-03 MED ORDER — ONDANSETRON 4 MG PO TBDP: 4.0000 mg | ORAL_TABLET | Freq: Three times a day (TID) | ORAL | 0 refills | Status: DC | PRN

## 2017-05-03 MED ORDER — LORAZEPAM 2 MG/ML IJ SOLN
0.5000 mg | Freq: Once | INTRAMUSCULAR | Status: AC
  Administered 2017-05-03: 0.5 mg via INTRAVENOUS
  Filled 2017-05-03: qty 1

## 2017-05-03 MED ORDER — MECLIZINE HCL 25 MG PO TABS
25.0000 mg | ORAL_TABLET | Freq: Once | ORAL | Status: AC
  Administered 2017-05-03: 25 mg via ORAL
  Filled 2017-05-03: qty 1

## 2017-05-03 NOTE — ED Triage Notes (Signed)
Patient ate dinner at [employer] and after he got home started feeling nauseous with vomiting and weakness and dizziness.  Pt has hx of vertigo, and has concern of food poisoning.  Patient arrived via EMS vomiting in a 2/3 emesis bag.  Pt is complaining of dizziness when he opens his eyes or moves.

## 2017-05-03 NOTE — ED Provider Notes (Signed)
[facility] Emergency Department Provider Note   ____________________________________________    I have reviewed the triage vital signs and the nursing notes.   HISTORY  Chief Complaint Emesis     HPI Kyle Hood is a 62 y.o. adult who presents with complaints of dizziness, nausea and vomiting.  Patient reports he had recently eaten a hamburger at dinner and went shopping with his friends, he started to feel quite hot and then developed a sensation of the room spinning which became acutely worse when he stood up.  He then became quite nauseated and vomited several times.  Patient continues to complain that whenever he moves his head he feels as though the room is spinning and it makes him nauseated.  This has happened once before after a heavy salty meal.  Complains of some abdominal cramping but no abdominal pain.  No headache.  No neuro deficits   Past Medical History:  Diagnosis Date  . Abnormal liver function test   . Anxiety   . Arthritis   . Depression   . Fatty liver   . Heart murmur   . Hepatitis B   . Hyperlipidemia   . Hypertension   . Panic attacks     Patient Active Problem List   Diagnosis Date Noted  . Anxiety, generalized 02/06/2017  . Chronic bilateral low back pain 12/18/2016  . 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 Polyneuropathy 12/18/2016  . Pain in limb 07/03/2016  . DJD (degenerative joint disease) 07/03/2016  . Cellulitis 06/18/2016  . Hypertension 10/26/2014  . Lower extremity edema 10/26/2014  . Hyperlipidemia 10/26/2014  . Transient loss of consciousness 09/08/2014    Past Surgical History:  Procedure Laterality Date  . PROSTATE BIOPSY      Prior to Admission medications   Medication Sig Start Date End Date Taking? Authorizing Provider  clonazePAM (KLONOPIN) 0.5 MG tablet Take 1 tablet (0.5 mg total) by mouth daily. As needed 03/19/17   Jomarie Longs, MD  hydrochlorothiazide (HYDRODIURIL) 25 MG  tablet Take 25 mg by mouth daily.    [provider]  meclizine (ANTIVERT) 25 MG tablet Take 1 tablet (25 mg total) by mouth 3 (three) times daily as needed for dizziness. 05/03/17   Jene Every, MD  naproxen (NAPROSYN) 500 MG tablet Take 500 mg by mouth 2 (two) times daily with a meal.    [provider]  ondansetron (ZOFRAN ODT) 4 MG disintegrating tablet Take 1 tablet (4 mg total) by mouth every 8 (eight) hours as needed for nausea or vomiting. 05/03/17   Jene Every, MD  traZODone (DESYREL) 50 MG tablet Take 1 tablet (50 mg total) by mouth at bedtime as needed for sleep. 04/24/17   Jomarie Longs, MD  Vilazodone HCl (VIIBRYD) 10 MG TABS Take 1 tablet (10 mg total) by mouth daily. 04/24/17   Jomarie Longs, MD     Allergies Lisinopril  Family History  Problem Relation Age of Onset  . Cancer Mother        Pancreatic  . Diabetes Mother   . Hypertension Mother   . Varicose Veins Mother   . Alcohol abuse Mother   . Heart disease Father        CABG  . Cancer Father        Jaw  . Aortic aneurysm Father   . Congestive Heart Failure Father   . Hypertension Father   . Varicose Veins Father   . Alcohol abuse Father   . Bipolar disorder  Sister   . Bipolar disorder Brother     Social History Social History   Tobacco Use  . Smoking status: Never Smoker  . Smokeless tobacco: Never Used  Substance Use Topics  . Alcohol use: No    Frequency: Never    Comment: Occasional wine   . Drug use: No    Review of Systems  Constitutional: Dizziness as above Eyes: No visual changes.  ENT: No sore throat.  No ear pain Cardiovascular: Denies chest pain. Respiratory: Denies shortness of breath. Gastrointestinal: As above Genitourinary: Negative for dysuria. Musculoskeletal: Negative for back pain. Skin: Negative for rash. Neurological: Negative for headaches    ____________________________________________   PHYSICAL EXAM:  VITAL SIGNS: ED Triage Vitals  [05/03/17 1958]  Enc Vitals Group     BP (!) 173/88     Pulse Rate 82     Resp 18     Temp 97.8 F (36.6 C)     Temp Source Oral     SpO2 98 %     Weight      Height      Head Circumference      Peak Flow      Pain Score      Pain Loc      Pain Edu?      Excl. in GC?     Constitutional: Alert and oriented. No acute distress. Pleasant and interactive Eyes: Conjunctivae are normal.   Nose: No congestion/rhinnorhea. Mouth/Throat: Mucous membranes are moist.    Cardiovascular: Normal rate, regular rhythm. Grossly normal heart sounds.  Good peripheral circulation. Respiratory: Normal respiratory effort.  No retractions. Lungs CTAB. Gastrointestinal: Soft and nontender. No distention.  No CVA tenderness. Genitourinary: deferred Musculoskeleta  Warm and well perfused Neurologic:  Normal speech and language. No gross focal neurologic deficits are appreciated.  Skin:  Skin is warm, dry and intact. No rash noted. Psychiatric: Mood and affect are normal. Speech and behavior are normal.  ____________________________________________   LABS (all labs ordered are listed, but only abnormal results are displayed)  Labs Reviewed  COMPREHENSIVE METABOLIC PANEL - Abnormal; Notable for the following components:      Result Value   Potassium 3.2 (*)    Glucose, Bld 129 (*)    Calcium 8.8 (*)    All other components within normal limits  CBC - Abnormal; Notable for the following components:   MCH 34.1 (*)    All other components within normal limits  LIPASE, BLOOD  URINALYSIS, COMPLETE (UACMP) WITH MICROSCOPIC   ____________________________________________  EKG  None ____________________________________________  RADIOLOGY  CT head unremarkable ____________________________________________   PROCEDURES  Procedure(s) performed: No  Procedures   Critical Care performed:No ____________________________________________   INITIAL IMPRESSION / ASSESSMENT AND PLAN / ED  COURSE  Pertinent labs & imaging results that were available during my care of the patient were reviewed by me and considered in my medical decision making (see chart for details).  Patient presents with dizziness, nausea and vomiting.  Differential diagnosis includes vertigo, food poisoning, vasovagal dizziness.  No indication of neuro deficits, headache or stroke  We will treat with IV fluids, IV Zofran, meclizine, small dose of IV Ativan and reevaluate   ----------------------------------------- 11:11 PM on 05/03/2017 -----------------------------------------  Patient reports he feels completely better, completely asymptomatic at this time.  He wants to go home.  I think this is reasonable given reassuring workup    ____________________________________________   FINAL CLINICAL IMPRESSION(S) / ED DIAGNOSES  Final diagnoses:  Vertigo  Note:  This document was prepared using Dragon voice recognition software and may include unintentional dictation errors.    Jene Every, MD 05/03/17 2312

## 2017-05-03 NOTE — ED Notes (Signed)
Family at bedside. 

## 2017-05-14 ENCOUNTER — Ambulatory Visit: Payer: Medicare Other | Admitting: Student in an Organized Health Care Education/Training Program

## 2017-05-14 ENCOUNTER — Ambulatory Visit: Payer: Medicare Other | Admitting: Licensed Clinical Social Worker

## 2017-05-28 ENCOUNTER — Ambulatory Visit (INDEPENDENT_AMBULATORY_CARE_PROVIDER_SITE_OTHER): Payer: Medicare Other | Admitting: Licensed Clinical Social Worker

## 2017-05-28 DIAGNOSIS — F330 Major depressive disorder, recurrent, mild: Secondary | ICD-10-CM | POA: Diagnosis not present

## 2017-05-28 DIAGNOSIS — F640 Transsexualism: Secondary | ICD-10-CM

## 2017-05-28 DIAGNOSIS — F4010 Social phobia, unspecified: Secondary | ICD-10-CM | POA: Diagnosis not present

## 2017-05-28 DIAGNOSIS — F410 Panic disorder [episodic paroxysmal anxiety] without agoraphobia: Secondary | ICD-10-CM | POA: Diagnosis not present
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
## 2017-06-05 ENCOUNTER — Ambulatory Visit (INDEPENDENT_AMBULATORY_CARE_PROVIDER_SITE_OTHER): Payer: Medicare Other | Admitting: Psychiatry

## 2017-06-05 ENCOUNTER — Other Ambulatory Visit: Payer: Self-pay

## 2017-06-05 ENCOUNTER — Encounter: Payer: Self-pay | Admitting: Psychiatry

## 2017-06-05 VITALS — BP 143/85 | HR 73 | Temp 98.1°F | Wt 265.6 lb

## 2017-06-05 DIAGNOSIS — F4010 Social phobia, unspecified: Secondary | ICD-10-CM | POA: Diagnosis not present

## 2017-06-05 DIAGNOSIS — F640 Transsexualism: Secondary | ICD-10-CM | POA: Diagnosis not present

## 2017-06-05 DIAGNOSIS — F410 Panic disorder [episodic paroxysmal anxiety] without agoraphobia: Secondary | ICD-10-CM

## 2017-06-05 DIAGNOSIS — F330 Major depressive disorder, recurrent, mild: Secondary | ICD-10-CM

## 2017-06-05 MED ORDER — TRAZODONE HCL 50 MG PO TABS: 50.0000 mg | ORAL_TABLET | Freq: Every evening | ORAL | 0 refills | Status: DC | PRN

## 2017-06-05 MED ORDER — VILAZODONE HCL 10 MG PO TABS: 10.0000 mg | ORAL_TABLET | Freq: Every day | ORAL | 0 refills | Status: DC

## 2017-06-05 NOTE — Progress Notes (Signed)
BH MD OP Progress Note  06/05/2017 2:56 PM Kyle Hood  MRN:  [id number]  Chief Complaint: ' I am doing well."  Chief Complaint    Follow-up; Medication Refill     HPI: Kyle Hood 62 year old transgender male to male, lives in [place], has a history of depression, panic disorder, social anxiety disorder, presented to the clinic today for a follow-up visit.  Kyle Hood today reports doing well on the current medication.  Kyle Hood reports being able to get off the Klonopin.  The last dose of Klonopin was 2 weeks ago.  Kyle Hood is happy being able to do that.  Kyle Hood continues to take [place]ibryd on a daily basis.  Kyle Hood denies any side effects to the Viibryd.  Kyle Hood reports the medication is keeping the depression and anxiety stable.  Kyle Hood also continues to have good sleep at night.  Reports trazodone is helping.  Kyle Hood continues to follow-up with Ms. Peacock for psychotherapy.  Reports good benefit from the same.  Kyle Hood continues to have some pain problems and currently Dr. Cherylann RatelLateef is taking care of this.  Kyle Hood seemed preoccupied discussing UFO sightings this evaluation.  Kyle Hood reports being very interested in the same and has read a lot about that same. Visit Diagnosis:    ICD-10-CM   1. MDD (major depressive disorder), recurrent episode, mild (HCC) F33.0 traZODone (DESYREL) 50 MG tablet    Vilazodone HCl (VIIBRYD) 10 MG TABS  2. Panic disorder F41.0   3. Social anxiety disorder F40.10   4. 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  Patient went through a period of homelessness at that time.  Reports went on to a bridge in order to jump of and kill self.  Patient did see a counselor in [place] in the past.  In the past has seen providers in [place] as well as [place].   History of past trials of Zoloft, Wellbutrin.  Past Medical History:  Past Medical History:  Diagnosis Date  . Abnormal liver  function test   . Anxiety   . Arthritis   . Depression   . Fatty liver   . Heart murmur   . Hepatitis B   . Hyperlipidemia   . Hypertension   . Panic attacks     Past Surgical History:  Procedure Laterality Date  . PROSTATE BIOPSY      Family Psychiatric History: Brother-committed suicide.  Brother-bipolar disorder as well as alcoholism.  Family History:  Family History  Problem Relation Age of Onset  . Cancer Mother        Pancreatic  . Diabetes Mother   . Hypertension Mother   . Varicose Veins Mother   . Alcohol abuse Mother   . Heart disease Father        CABG  . Cancer Father        Jaw  . Aortic aneurysm Father   . Congestive Heart Failure Father   . Hypertension Father   . Varicose Veins Father   . Alcohol abuse Father   . Bipolar disorder Sister   . Bipolar disorder Brother    Substance abuse history: Denies  Social History: Raised by both parents.  Went up to 11th grade.  Used to live in [place].  Moved to [place] 4 years ago.  Currently on SSD for mental and medical problems.  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  Has a  companion who stays with them, his name is [name]. Social History   Socioeconomic History  . Marital status: Significant Other    Spouse name: denise  . Number of children: 0  . Years of education: None  . Highest education level: 11th grade  Social Needs  . Financial resource strain: Very hard  . Food insecurity - worry: Never true  . Food insecurity - inability: Never true  . Transportation needs - medical: No  . Transportation needs - non-medical: No  Occupational History    Comment: disabled  Tobacco Use  . Smoking status: Never Smoker  . Smokeless tobacco: Never Used  Substance and Sexual Activity  . Alcohol use: No    Frequency: Never    Comment: Occasional wine   . Drug use: No  . Sexual activity: Not Currently  Other Topics Concern  . None  Social History Narrative  . None    Allergies:   Allergies  Allergen Reactions  . Lisinopril Cough    Metabolic Disorder Labs: Lab Results  Component Value Date   HGBA1C 6.1 08/16/2014   No results found for: PROLACTIN Lab Results  Component Value Date   CHOL 145 08/16/2014   TRIG 320 (A) 08/16/2014   HDL 36 08/16/2014   LDLCALC 45 08/16/2014   Lab Results  Component Value Date   TSH 1.61 08/16/2014    Therapeutic Level Labs: No results found for: LITHIUM No results found for: VALPROATE No components found for:  CBMZ  Current Medications: Current Outpatient Medications  Medication Sig Dispense Refill  . hydrochlorothiazide (HYDRODIURIL) 25 MG tablet Take 25 mg by mouth daily.    . meclizine (ANTIVERT) 25 MG tablet Take 1 tablet (25 mg total) by mouth 3 (three) times daily as needed for dizziness. 20 tablet 0  . naproxen (NAPROSYN) 500 MG tablet Take 500 mg by mouth 2 (two) times daily with a meal.    . ondansetron (ZOFRAN ODT) 4 MG disintegrating tablet Take 1 tablet (4 mg total) by mouth every 8 (eight) hours as needed for nausea or vomiting. 20 tablet 0  . traZODone (DESYREL) 50 MG tablet Take 1 tablet (50 mg total) by mouth at bedtime as needed for sleep. 90 tablet 0  . Vilazodone HCl (VIIBRYD) 10 MG TABS Take 1 tablet (10 mg total) by mouth daily. 90 tablet 0   No current facility-administered medications for this visit.      Musculoskeletal: Strength & Muscle Tone: within normal limits Gait & Station: normal Patient leans: N/A  Psychiatric Specialty Exam: Review of Systems  Psychiatric/Behavioral: Positive for depression (improved). The patient is nervous/anxious (improved).   All other systems reviewed and are negative.   Blood pressure (!) 143/85, pulse 73, temperature 98.1 F (36.7 C), temperature source Oral, weight 265 lb 9.6 oz (120.5 kg).Body mass index is 34.1 kg/m.  General Appearance: Casual  Eye Contact:  Fair  Speech:  Clear and Coherent  Volume:  Normal  Mood:  Euthymic  Affect:   Congruent  Thought Process:  Goal Directed and Descriptions of Associations: Circumstantial  Orientation:  Full (Time, Place, and Person)  Thought Content: Logical   Suicidal Thoughts:  No  Homicidal Thoughts:  No  Memory:  Immediate;   Fair Recent;   Fair Remote;   Fair  Judgement:  Fair  Insight:  Fair  Psychomotor Activity:  Normal  Concentration:  Concentration: Fair and Attention Span: Fair  Recall:  [employer] of Knowledge: Fair  Language: Fair  Akathisia:  No  Handed:  Right  AIMS (if indicated):na  Assets:  Communication Skills Desire for Improvement Housing Social Support  ADL's:  Intact  Cognition: WNL  Sleep:  Fair   Screenings: PHQ2-9     Office Visit from 04/03/2017 in [facility] Procedure visit from 03/05/2017 in [facility] Office Visit from 02/13/2017 in [facility] Procedure visit from 01/29/2017 in [facility] Office Visit from 01/21/2017 in [facility]  PHQ-2 Total Score  0  0  0  0  0       Assessment and Plan: Kyle Hood is a 62 year old male to male transgender, with history of depression and anxiety, presented to the clinic today for a follow-up visit.  Kyle Hood is currently doing well on the current medication regimen.  Discussed plan as noted below.  Plan Depression Continue Viibryd 10 mg p.o. daily  For anxiety symptoms Continue Viibryd 10 mg p.o. daily Discontinue Klonopin-patient was able to taper off of it.  For insomnia Trazodone 50 mg p.o. nightly as needed.  Refered to Ms. Peacock for CBT-reports it is beneficial.  Blood Pressure seems to be elevated today.  Discussed with patient to keep track of it and follow-up with PMD.  Follow-up in clinic in 2 months or sooner if needed.  More than 50 % of the time was spent for  psychoeducation and supportive psychotherapy and care coordination.  This note was generated in part or whole with voice recognition software. Voice recognition is usually quite accurate but there are transcription errors that can and very often do occur. I apologize for any typographical errors that were not detected and corrected.       Jomarie Longs, MD 06/05/2017, 2:56 PM

## 2017-06-06 ENCOUNTER — Encounter: Payer: Self-pay | Admitting: Psychiatry

## 2017-06-11 ENCOUNTER — Ambulatory Visit (HOSPITAL_BASED_OUTPATIENT_CLINIC_OR_DEPARTMENT_OTHER): Payer: Medicare Other | Admitting: Student in an Organized Health Care Education/Training Program

## 2017-06-11 ENCOUNTER — Other Ambulatory Visit: Payer: Self-pay

## 2017-06-11 ENCOUNTER — Encounter: Payer: Self-pay | Admitting: Student in an Organized Health Care Education/Training Program

## 2017-06-11 ENCOUNTER — Ambulatory Visit
Admission: RE | Admit: 2017-06-11 | Discharge: 2017-06-11 | Disposition: A | Discharge status: Home / Self Care | Payer: Medicare Other | Source: Ambulatory Visit | Attending: Student in an Organized Health Care Education/Training Program | Admitting: Student in an Organized Health Care Education/Training Program

## 2017-06-11 VITALS — BP 140/76 | HR 70 | Temp 98.0°F | Resp 14 | Ht 74.0 in | Wt 265.0 lb

## 2017-06-11 DIAGNOSIS — Z888 Allergy status to other drugs, medicaments and biological substances status: Secondary | ICD-10-CM | POA: Diagnosis not present

## 2017-06-11 DIAGNOSIS — M47816 Spondylosis without myelopathy or radiculopathy, lumbar region: Secondary | ICD-10-CM | POA: Insufficient documentation

## 2017-06-11 DIAGNOSIS — Z79899 Other long term (current) drug therapy: Secondary | ICD-10-CM | POA: Diagnosis not present

## 2017-06-11 DIAGNOSIS — Z791 Long term (current) use of non-steroidal anti-inflammatories (NSAID): Secondary | ICD-10-CM | POA: Insufficient documentation

## 2017-06-11 DIAGNOSIS — F419 Anxiety disorder, unspecified: Secondary | ICD-10-CM | POA: Insufficient documentation

## 2017-06-11 DIAGNOSIS — M545 Low back pain: Secondary | ICD-10-CM | POA: Insufficient documentation

## 2017-06-11 MED ORDER — ROPIVACAINE HCL 2 MG/ML IJ SOLN
10.0000 mL | Freq: Once | INTRAMUSCULAR | Status: DC
  Filled 2017-06-11: qty 10

## 2017-06-11 MED ORDER — LACTATED RINGERS IV SOLN
1000.0000 mL | Freq: Once | INTRAVENOUS | Status: AC
  Administered 2017-06-11: 1000 mL via INTRAVENOUS

## 2017-06-11 MED ORDER — LIDOCAINE HCL 1 % IJ SOLN
10.0000 mL | Freq: Once | INTRAMUSCULAR | Status: AC
Start: 2017-06-11 — End: 2017-06-11
  Administered 2017-06-11: 5 mL
  Filled 2017-06-11: qty 10

## 2017-06-11 MED ORDER — FENTANYL CITRATE (PF) 100 MCG/2ML IJ SOLN
25.0000 ug | INTRAMUSCULAR | Status: DC | PRN
  Administered 2017-06-11: 25 ug via INTRAVENOUS
  Filled 2017-06-11: qty 2

## 2017-06-11 MED ORDER — DEXAMETHASONE SODIUM PHOSPHATE 10 MG/ML IJ SOLN
10.0000 mg | Freq: Once | INTRAMUSCULAR | Status: AC
Start: 2017-06-11 — End: 2017-06-11
  Administered 2017-06-11: 10 mg
  Filled 2017-06-11: qty 1

## 2017-06-11 NOTE — Progress Notes (Signed)
Patient's Name: Kyle Hood  MRN: [id number]  Referring Provider: Orene Desanctis, MD  DOB: 03-04-1956  PCP: Orene Desanctis, MD  DOS: 06/11/2017  Note by: Edward Jolly, MD  Service setting: Ambulatory outpatient  Specialty: Interventional Pain Management  Patient type: Established  Location: [facility] (AMB) Pain Management Facility  Visit type: Interventional Procedure   Primary Reason for Visit: Interventional Pain Management Treatment. CC: Back Pain (lower)  Procedure:  Anesthesia, Analgesia, Anxiolysis:  Type: Therapeutic Lumbar Facet, Medial Branch Radiofrequency Ablation Region: Posterolateral Lumbosacral Spine Level: L3, L4, L5, Medial Branch Level(s). Lesioning of these levels should completely denervate the L3-4, L4-5, and the L5-S1 lumbar facet joints. Laterality: Right paraspinal  Type: Local Anesthesia with Moderate (Conscious) Sedation Local Anesthetic: Lidocaine 1% Route: Intravenous (IV) IV Access: Secured Sedation: Meaningful verbal contact was maintained at all times during the procedure  Indication(s): Analgesia and Anxiety   Indications: 1. Spondylosis without myelopathy or radiculopathy, lumbar region    Mr. Hollenkamp has been dealing with the above chronic pain for longer than three months and has either failed to respond, was unable to tolerate, or simply did not get enough benefit from other more conservative therapies including, but not limited to: 1. Over-the-counter medications 2. Anti-inflammatory medications 3. Muscle relaxants 4. Membrane stabilizers 5. Opioids 6. Physical therapy 7. Modalities (Heat, ice, etc.) 8. Invasive techniques such as nerve blocks. Mr. Penza has attained more than 50% relief of the pain from a series of diagnostic injections conducted in separate occasions.  Pain Score: Pre-procedure: 7 /10 Post-procedure: 0-No pain/10  Pre-op Assessment:  Mr. Beaverson is a 62 y.o. (year old), male patient, seen today for interventional  treatment. He  has a past surgical history that includes Prostate biopsy. Mr. Plona has a current medication list which includes the following prescription(s): hydrochlorothiazide, meclizine, naproxen, pravastatin, trazodone, vilazodone hcl, and ondansetron, and the following Facility-Administered Medications: fentanyl and ropivacaine (pf) 2 mg/ml (0.2%). His primarily concern today is the Back Pain (lower)  Initial Vital Signs:  Pulse Rate: 70 Temp: 98 F (36.7 C) Resp: 11 BP: (!) 138/93 SpO2: 99 %  BMI: Estimated body mass index is 34.02 kg/m as calculated from the following:   Height as of this encounter: 6\' 2"  (1.88 m).   Weight as of this encounter: 265 lb (120.2 kg).  Risk Assessment: Allergies: Reviewed. He is allergic to lisinopril.  Allergy Precautions: None required Coagulopathies: Reviewed. None identified.  Blood-thinner therapy: None at this time Active Infection(s): Reviewed. None identified. Mr. Rydalch is afebrile  Site Confirmation: Mr. Cristina was asked to confirm the procedure and laterality before marking the site Procedure checklist: Completed Consent: Before the procedure and under the influence of no sedative(s), amnesic(s), or anxiolytics, the patient was informed of the treatment options, risks and possible complications. To fulfill our ethical and legal obligations, as recommended by the American Medical Association's Code of Ethics, I have informed the patient of my clinical impression; the nature and purpose of the treatment or procedure; the risks, benefits, and possible complications of the intervention; the alternatives, including doing nothing; the risk(s) and benefit(s) of the alternative treatment(s) or procedure(s); and the risk(s) and benefit(s) of doing nothing. The patient was provided information about the general risks and possible complications associated with the procedure. These may include, but are not limited to: failure to achieve desired  goals, infection, bleeding, organ or nerve damage, allergic reactions, paralysis, and death. In addition, the patient was informed of those risks and complications associated to Spine-related procedures, such  as failure to decrease pain; infection (i.e.: Meningitis, epidural or intraspinal abscess); bleeding (i.e.: epidural hematoma, subarachnoid hemorrhage, or any other type of intraspinal or peri-dural bleeding); organ or nerve damage (i.e.: Any type of peripheral nerve, nerve root, or spinal cord injury) with subsequent damage to sensory, motor, and/or autonomic systems, resulting in permanent pain, numbness, and/or weakness of one or several areas of the body; allergic reactions; (i.e.: anaphylactic reaction); and/or death. Furthermore, the patient was informed of those risks and complications associated with the medications. These include, but are not limited to: allergic reactions (i.e.: anaphylactic or anaphylactoid reaction(s)); adrenal axis suppression; blood sugar elevation that in diabetics may result in ketoacidosis or comma; water retention that in patients with history of congestive heart failure may result in shortness of breath, pulmonary edema, and decompensation with resultant heart failure; weight gain; swelling or edema; medication-induced neural toxicity; particulate matter embolism and blood vessel occlusion with resultant organ, and/or nervous system infarction; and/or aseptic necrosis of one or more joints. Finally, the patient was informed that Medicine is not an exact science; therefore, there is also the possibility of unforeseen or unpredictable risks and/or possible complications that may result in a catastrophic outcome. The patient indicated having understood very clearly. We have given the patient no guarantees and we have made no promises. Enough time was given to the patient to ask questions, all of which were answered to the patient's satisfaction. Mr. Rosenboom has indicated  that he wanted to continue with the procedure. Attestation: I, the ordering provider, attest that I have discussed with the patient the benefits, risks, side-effects, alternatives, likelihood of achieving goals, and potential problems during recovery for the procedure that I have provided informed consent. Date: 06/11/2017  Time: N/A  Pre-Procedure Preparation:  Monitoring: As per clinic protocol. Respiration, ETCO2, SpO2, BP, heart rate and rhythm monitor placed and checked for adequate function Safety Precautions: Patient was assessed for positional comfort and pressure points before starting the procedure. Time-out: I initiated and conducted the "Time-out" before starting the procedure, as per protocol. The patient was asked to participate by confirming the accuracy of the "Time Out" information. Verification of the correct person, site, and procedure were performed and confirmed by me, the nursing staff, and the patient. "Time-out" conducted as per Joint Commission's Universal Protocol (UP.01.01.01). "Time-out" Date & Time: 06/11/2017; 1104 hrs.  Description of Procedure:       Position: Prone Target Area: For Lumbar Facet blocks, the target is the groove formed by the junction of the transverse process and superior articular process. For the L5 dorsal ramus, the target is the notch between superior articular process and sacral ala.  Approach: Paraspinal approach. Area Prepped: Entire Posterior Lumbosacral Region Prepping solution: Hibiclens (4.0% Chlorhexidine gluconate solution) Safety Precautions: Aspiration looking for blood return was conducted prior to all injections. At no point did we inject any substances, as a needle was being advanced. No attempts were made at seeking any paresthesias. Safe injection practices and needle disposal techniques used. Medications properly checked for expiration dates. SDV (single dose vial) medications used. Description of the Procedure: The target area for  the right L4 medial branch is at the junction of the L5 transverse process and the L5 superior articulating process. The target area for the L3 medial branches at the junction of the L4 transverse process and L4 superior articular process.  Target area for the L5 medial branch is at the junction of the S1 superior articular process and the sacral ala. Protocol guidelines  were followed. The patient was placed in position over the procedure table. The target area was identified and the area prepped in the usual manner. The skin and muscle were infiltrated with local anesthetic. Appropriate amount of time allowed to pass for local anesthetics to take effect. Radiofrequency needles were introduced to the target area using fluoroscopic guidance. Using the NeuroTherm NT1100 Radiofrequency Generator, sensory stimulation using 50 Hz was used to locate & identify the nerve, making sure that the needle was positioned such that there was no sensory stimulation below 0.3 V or above 0.7 V. Stimulation using 2 Hz was used to evaluate the motor component. Care was taken not to lesion any nerves that demonstrated motor stimulation of the lower extremities at an output of less than 2.5 times that of the sensory threshold, or a maximum of 2.0 V. Once satisfactory placement of the needles was achieved, the numbing solution was slowly injected after negative aspiration. After waiting for at least 2 minutes, the ablation was performed at 80 degrees C for 60 seconds, using regular Radiofrequency settings. Once the procedure was completed, the needles were then removed and the area cleansed, making sure to leave some of the prepping solution back to take advantage of its long term bactericidal properties. Intra-operative Compliance: Compliant  Illustration of the posterior view of the lumbar spine and the posterior neural structures. Laminae of L2 through S1 are labeled. DPRL5, dorsal primary ramus of L5; DPRS1, dorsal primary ramus of  S1; DPR3, dorsal primary ramus of L3; FJ, facet (zygapophyseal) joint L3-L4; I, inferior articular process of L4; LB1, lateral branch of dorsal primary ramus of L1; IAB, inferior articular branches from L3 medial branch (supplies L4-L5 facet joint); IBP, intermediate branch plexus; MB3, medial branch of dorsal primary ramus of L3; NR3, third lumbar nerve root; S, superior articular process of L5; SAB, superior articular branches from L4 (supplies L4-5 facet joint also); TP3, transverse process of L3.  Vitals:   06/11/17 1132 06/11/17 1142 06/11/17 1152 06/11/17 1200  BP: (!) 137/108 137/72 (!) 141/79 140/76  Pulse:      Resp: 13 14 13 14   Temp:      SpO2: 100% 100% 100% 100%  Weight:      Height:        Start Time: 1110 hrs. End Time: 1132 hrs. Materials & Medications:  Needle(s) Type: Teflon-coated, curved tip, Radiofrequency needle(s) Gauge: 22G Length: 10cm Medication(s): Please see orders for medications and dosing details. After ablation, each site flushed with 0.3 cc of Decadron 10 mg/cc.  Needle was then flushed with 1 cc of 1% lidocaine.  Needles were removed. Imaging Guidance (Spinal):  Type of Imaging Technique: Fluoroscopy Guidance (Spinal) Indication(s): Assistance in needle guidance and placement for procedures requiring needle placement in or near specific anatomical locations not easily accessible without such assistance. Exposure Time: Please see nurses notes. Contrast: None used. Fluoroscopic Guidance: I was personally present during the use of fluoroscopy. "Tunnel Vision Technique" used to obtain the best possible view of the target area. Parallax error corrected before commencing the procedure. "Direction-depth-direction" technique used to introduce the needle under continuous pulsed fluoroscopy. Once target was reached, antero-posterior, oblique, and lateral fluoroscopic projection used confirm needle placement in all planes. Images permanently stored in  EMR. Interpretation: No contrast injected. I personally interpreted the imaging intraoperatively. Adequate needle placement confirmed in multiple planes. Permanent images saved into the patient's record.  Antibiotic Prophylaxis:   Anti-infectives (From admission, onward)   None  Indication(s): None identified  Post-operative Assessment:  Post-procedure Vital Signs:  Pulse Rate: 70 Temp: 98 F (36.7 C) Resp: 14 BP: 140/76 SpO2: 100 %  EBL: None  Complications: No immediate post-treatment complications observed by team, or reported by patient.  Note: The patient tolerated the entire procedure well. A repeat set of vitals were taken after the procedure and the patient was kept under observation following institutional policy, for this type of procedure. Post-procedural neurological assessment was performed, showing return to baseline, prior to discharge. The patient was provided with post-procedure discharge instructions, including a section on how to identify potential problems. Should any problems arise concerning this procedure, the patient was given instructions to immediately contact us, at any time, without hesitation. In any case, we plan to contact the patient by telephone for a follow-up status report regarding this interventional procedure.  Comments:  No additional relevant information. 5 out of 5 strength bilateral lower extremity: Plantar flexion, dorsiflexion, knee flexion, knee extension.  Plan of Care   Imaging Orders     DG C-Arm 1-60 Min-No Report  Procedure Orders     Radiofrequency,Lumbar Follow-up for contralateral RFA Medications ordered for procedure: Meds ordered this encounter  Medications  . lactated ringers infusion 1,000 mL  . fentaNYL (SUBLIMAZE) injection 25-100 mcg    Make sure Narcan is available in the pyxis when using this medication. In the event of respiratory depression (RR< 8/min): Titrate NARCAN (naloxone) in increments of 0.1 to 0.2  mg IV at 2-3 minute intervals, until desired degree of reversal.  . lidocaine (XYLOCAINE) 1 % (with pres) injection 10 mL  . ropivacaine (PF) 2 mg/mL (0.2%) (NAROPIN) injection 10 mL  . dexamethasone (DECADRON) injection 10 mg   Medications administered: We administered lactated ringers, fentaNYL, lidocaine, and dexamethasone.  See the medical record for exact dosing, route, and time of administration.  New Prescriptions   No medications on file   Disposition: Discharge home   Discharge Date & Time: 06/11/2017; 1201 hrs.   Physician-requested Follow-up: Return in about 2 weeks (around 06/25/2017) for Contra-lateral RFA.  Future Appointments  Date Time Provider Department Center  07/02/2017  9:30 AM Edward Jolly, MD [facility] None  07/02/2017  2:00 PM Marinda Elk, LCSW [facility] None  09/03/2017  2:30 PM Jomarie Longs, MD [facility] None   Primary Care Physician: Orene Desanctis, MD Location: [facility] Facility Note by: Edward Jolly, MD Date: 06/11/2017; Time: 1:50 PM  Disclaimer:  Medicine is not an exact science. The only guarantee in medicine is that nothing is guaranteed. It is important to note that the decision to proceed with this intervention was based on the information collected from the patient. The Data and conclusions were drawn from the patient's questionnaire, the interview, and the physical examination. Because the information was provided in large part by the patient, it cannot be guaranteed that it has not been purposely or unconsciously manipulated. Every effort has been made to obtain as much relevant data as possible for this evaluation. It is important to note that the conclusions that lead to this procedure are derived in large part from the available data. Always take into account that the treatment will also be dependent on availability of resources and existing treatment guidelines, considered by other Pain Management Practitioners as being  common knowledge and practice, at the time of the intervention. For Medico-Legal purposes, it is also important to point out that variation in procedural techniques and pharmacological choices are the acceptable norm. The  indications, contraindications, technique, and results of the above procedure should only be interpreted and judged by a Board-Certified Interventional Pain Specialist with extensive familiarity and expertise in the same exact procedure and technique.

## 2017-06-11 NOTE — Patient Instructions (Signed)
Radiofrequency Lesioning Radiofrequency lesioning is a procedure that is performed to relieve pain. The procedure is often used for back, neck, or arm pain. Radiofrequency lesioning involves the use of a machine that creates radio waves to make heat. During the procedure, the heat is applied to the nerve that carries the pain signal. The heat damages the nerve and interferes with the pain signal. Pain relief usually starts about 2 weeks after the procedure and lasts for 6 months to 1 year. Tell a health care provider about:  Any allergies you have.  All medicines you are taking, including vitamins, herbs, eye drops, creams, and over-the-counter medicines.  Any problems you or family members have had with anesthetic medicines.  Any blood disorders you have.  Any surgeries you have had.  Any medical conditions you have.  Whether you are pregnant or may be pregnant. What are the risks? Generally, this is a safe procedure. However, problems may occur, including:  Pain or soreness at the injection site.  Infection at the injection site.  Damage to nerves or blood vessels.  What happens before the procedure?  Ask your health care provider about: ? Changing or stopping your regular medicines. This is especially important if you are taking diabetes medicines or blood thinners. ? Taking medicines such as aspirin and ibuprofen. These medicines can thin your blood. Do not take these medicines before your procedure if your health care provider instructs you not to.  Follow instructions from your health care provider about eating or drinking restrictions.  Plan to have someone take you home after the procedure.  If you go home right after the procedure, plan to have someone with you for 24 hours. What happens during the procedure?  You will be given one or more of the following: ? A medicine to help you relax (sedative). ? A medicine to numb the area (local anesthetic).  You will be  awake during the procedure. You will need to be able to talk with the health care provider during the procedure.  With the help of a type of X-ray (fluoroscopy), the health care provider will insert a radiofrequency needle into the area to be treated.  Next, a wire that carries the radio waves (electrode) will be put through the radiofrequency needle. An electrical pulse will be sent through the electrode to verify the correct nerve. You will feel a tingling sensation, and you may have muscle twitching.  Then, the tissue that is around the needle tip will be heated by an electric current that is passed using the radiofrequency machine. This will numb the nerves.  A bandage (dressing) will be put on the insertion area after the procedure is done. The procedure may vary among health care providers and hospitals. What happens after the procedure?  Your blood pressure, heart rate, breathing rate, and blood oxygen level will be monitored often until the medicines you were given have worn off.  Return to your normal activities as directed by your health care provider. This information is not intended to replace advice given to you by your health care provider. Make sure you discuss any questions you have with your health care provider. Document Released: 12/12/2010 Document Revised: 09/21/2015 Document Reviewed: 05/23/2014 Elsevier Interactive Patient Education  2018 [employer]  

## 2017-06-11 NOTE — Progress Notes (Signed)
Safety precautions to be maintained throughout the outpatient stay will include: orient to surroundings, keep bed in low position, maintain call bell within reach at all times, provide assistance with transfer out of bed and ambulation.  

## 2017-06-12 ENCOUNTER — Telehealth: Payer: Self-pay | Admitting: *Deleted

## 2017-06-12 NOTE — Telephone Encounter (Signed)
Spoke to patient re; procedure on yesterday denies any questions or concerns.

## 2017-06-26 ENCOUNTER — Ambulatory Visit: Payer: Medicare Other | Admitting: Licensed Clinical Social Worker

## 2017-07-02 ENCOUNTER — Ambulatory Visit: Payer: Medicare Other | Admitting: Student in an Organized Health Care Education/Training Program

## 2017-07-02 ENCOUNTER — Ambulatory Visit (INDEPENDENT_AMBULATORY_CARE_PROVIDER_SITE_OTHER): Payer: Medicare Other | Admitting: Licensed Clinical Social Worker

## 2017-07-02 DIAGNOSIS — F410 Panic disorder [episodic paroxysmal anxiety] without agoraphobia: Secondary | ICD-10-CM

## 2017-07-02 DIAGNOSIS — F330 Major depressive disorder, recurrent, mild: Secondary | ICD-10-CM

## 2017-07-03 ENCOUNTER — Encounter: Payer: Self-pay | Admitting: Student in an Organized Health Care Education/Training Program

## 2017-07-03 ENCOUNTER — Other Ambulatory Visit: Payer: Self-pay

## 2017-07-03 ENCOUNTER — Ambulatory Visit
Payer: Medicare Other | Attending: Student in an Organized Health Care Education/Training Program | Admitting: Student in an Organized Health Care Education/Training Program

## 2017-07-03 VITALS — BP 152/67 | HR 67 | Temp 97.7°F | Resp 16 | Ht 74.0 in | Wt 264.0 lb

## 2017-07-03 DIAGNOSIS — Z809 Family history of malignant neoplasm, unspecified: Secondary | ICD-10-CM | POA: Diagnosis not present

## 2017-07-03 DIAGNOSIS — Z888 Allergy status to other drugs, medicaments and biological substances status: Secondary | ICD-10-CM | POA: Insufficient documentation

## 2017-07-03 DIAGNOSIS — M4698 Unspecified inflammatory spondylopathy, sacral and sacrococcygeal region: Secondary | ICD-10-CM

## 2017-07-03 DIAGNOSIS — R55 Syncope and collapse: Secondary | ICD-10-CM | POA: Diagnosis not present

## 2017-07-03 DIAGNOSIS — M5136 Other intervertebral disc degeneration, lumbar region: Secondary | ICD-10-CM | POA: Diagnosis not present

## 2017-07-03 DIAGNOSIS — Z818 Family history of other mental and behavioral disorders: Secondary | ICD-10-CM | POA: Diagnosis not present

## 2017-07-03 DIAGNOSIS — Z8249 Family history of ischemic heart disease and other diseases of the circulatory system: Secondary | ICD-10-CM | POA: Insufficient documentation

## 2017-07-03 DIAGNOSIS — G629 Polyneuropathy, unspecified: Secondary | ICD-10-CM | POA: Insufficient documentation

## 2017-07-03 DIAGNOSIS — F329 Major depressive disorder, single episode, unspecified: Secondary | ICD-10-CM | POA: Insufficient documentation

## 2017-07-03 DIAGNOSIS — E785 Hyperlipidemia, unspecified: Secondary | ICD-10-CM | POA: Insufficient documentation

## 2017-07-03 DIAGNOSIS — Z833 Family history of diabetes mellitus: Secondary | ICD-10-CM | POA: Insufficient documentation

## 2017-07-03 DIAGNOSIS — Z811 Family history of alcohol abuse and dependence: Secondary | ICD-10-CM | POA: Insufficient documentation

## 2017-07-03 DIAGNOSIS — M150 Primary generalized (osteo)arthritis: Secondary | ICD-10-CM | POA: Diagnosis not present

## 2017-07-03 DIAGNOSIS — Z791 Long term (current) use of non-steroidal anti-inflammatories (NSAID): Secondary | ICD-10-CM | POA: Insufficient documentation

## 2017-07-03 DIAGNOSIS — I10 Essential (primary) hypertension: Secondary | ICD-10-CM | POA: Insufficient documentation

## 2017-07-03 DIAGNOSIS — G8929 Other chronic pain: Secondary | ICD-10-CM | POA: Diagnosis not present

## 2017-07-03 DIAGNOSIS — F419 Anxiety disorder, unspecified: Secondary | ICD-10-CM | POA: Insufficient documentation

## 2017-07-03 DIAGNOSIS — M47816 Spondylosis without myelopathy or radiculopathy, lumbar region: Secondary | ICD-10-CM | POA: Insufficient documentation

## 2017-07-03 DIAGNOSIS — Z79899 Other long term (current) drug therapy: Secondary | ICD-10-CM | POA: Diagnosis not present

## 2017-07-03 DIAGNOSIS — M159 Polyosteoarthritis, unspecified: Secondary | ICD-10-CM

## 2017-07-03 DIAGNOSIS — M47818 Spondylosis without myelopathy or radiculopathy, sacral and sacrococcygeal region: Secondary | ICD-10-CM

## 2017-07-03 NOTE — Progress Notes (Signed)
Patient's Name: Kyle Hood  MRN: [id number]  Referring Provider: Orene Desanctis, MD  DOB: [birth year]  PCP: Kyle Desanctis, MD  DOS: 07/03/2017  Note by: Kyle Jolly, MD  Service setting: Ambulatory outpatient  Specialty: Interventional Pain Management  Location: [facility] (AMB) Pain Management Facility    Patient type: Established   Primary Reason(s) for Visit: Encounter for post-procedure evaluation of chronic illness with mild to moderate exacerbation CC: Back Pain (lower)  HPI  Kyle Hood is a 62 y.o. year old, male patient, who comes today for a post-procedure evaluation. He has Hypertension; Lower extremity edema; Hyperlipidemia; Transient loss of consciousness; Cellulitis; Pain in limb; DJD (degenerative joint disease); Anxiety, generalized; Chronic bilateral low back pain; Lumbar polyradiculopathy; and Polyneuropathy on their problem list. His primarily concern today is the Back Pain (lower)  Pain Assessment: Location: Lower Back Radiating: radiates down to buttocks/hips and down down back of leg around to front bilateral to knees Onset: More than a month ago Duration: Chronic pain Quality:   Severity: 7 /10 (self-reported pain score)  Note: Reported level is inconsistent with clinical observations. Clinically the patient looks like a 2/10 A 2/10 is viewed as "Mild to Moderate" and described as noticeable and distracting. Impossible to hide from other people. More frequent flare-ups. Still possible to adapt and function close to normal. It can be very annoying and may have occasional stronger flare-ups. With discipline, patients may get used to it and adapt.       When using our objective Pain Scale, levels between 6 and 10/10 are said to belong in an emergency room, as it progressively worsens from a 6/10, described as severely limiting, requiring emergency care not usually available at an outpatient pain management facility. At a 6/10 level, communication becomes difficult and requires  great effort. Assistance to reach the emergency department may be required. Facial flushing and profuse sweating along with potentially dangerous increases in heart rate and blood pressure will be evident. Effect on ADL: standing,walking Timing: Constant Modifying factors: voltren gel, pain meds, rest, lying down  Kyle Hood comes in today for post-procedure evaluation after the treatment done on 06/11/2017.  Further details on both, my assessment(s), as well as the proposed treatment plan, please see below.  Post-Procedure Assessment  06/11/2017 Procedure: Right L3, L4, L5 radiofrequency ablation Pre-procedure pain score:  7/10 Post-procedure pain score: 0/10         Influential Factors: BMI: 33.90 kg/m Intra-procedural challenges: None observed.         Assessment challenges: None detected.              Reported side-effects: None.        Post-procedural adverse reactions or complications: None reported         Sedation: Please see nurses note. When no sedatives are used, the analgesic levels obtained are directly associated to the effectiveness of the local anesthetics. However, when sedation is provided, the level of analgesia obtained during the initial 1 hour following the intervention, is believed to be the result of a combination of factors. These factors may include, but are not limited to: 1. The effectiveness of the local anesthetics used. 2. The effects of the analgesic(s) and/or anxiolytic(s) used. 3. The degree of discomfort experienced by the patient at the time of the procedure. 4. The patients ability and reliability in recalling and recording the events. 5. The presence and influence of possible secondary gains and/or psychosocial factors. Reported result: Relief experienced during the 1st hour after the procedure:  100 % (Ultra-Short Term Relief)            Interpretative annotation: Clinically appropriate result. Analgesia during this period is likely to be Local  Anesthetic and/or IV Sedative (Analgesic/Anxiolytic) related.          Effects of local anesthetic: The analgesic effects attained during this period are directly associated to the localized infiltration of local anesthetics and therefore cary significant diagnostic value as to the etiological location, or anatomical origin, of the pain. Expected duration of relief is directly dependent on the pharmacodynamics of the local anesthetic used. Long-acting (4-6 hours) anesthetics used.  Reported result: Relief during the next 4 to 6 hour after the procedure: 60 % (Short-Term Relief)            Interpretative annotation: Clinically appropriate result. Analgesia during this period is likely to be Local Anesthetic-related.          Long-term benefit: Defined as the period of time past the expected duration of local anesthetics (1 hour for short-acting and 4-6 hours for long-acting). With the possible exception of prolonged sympathetic blockade from the local anesthetics, benefits during this period are typically attributed to, or associated with, other factors such as analgesic sensory neuropraxia, antiinflammatory effects, or beneficial biochemical changes provided by agents other than the local anesthetics.  Reported result: Extended relief following procedure: 40%Long-Term Relief)            Interpretative annotation: Clinically appropriate result. Good relief. No permanent benefit expected. Inflammation plays a part in the etiology to the pain.          Current benefits: Defined as reported results that persistent at this point in time.   Analgesia: 25-50 % Kyle Hood reports that both, extremity and the axial pain improved with the treatment. Function: Somewhat improved ROM: Somewhat improved Interpretative annotation: Ongoing benefit. Therapeutic benefit observed. Adequate RF ablation.          Interpretation: Results would suggest adequate radiofrequency ablation.                  Plan:  Please  see "Plan of Care" for details.                Laboratory Chemistry  Inflammation Markers (CRP: Acute Phase) (ESR: Chronic Phase) Lab Results  Component Value Date   LATICACIDVEN 1.5 12/30/2015                         Rheumatology Markers No results found for: Audria Nine, [facility]              Renal Function Markers Lab Results  Component Value Date   BUN 17 05/03/2017   CREATININE 0.94 05/03/2017   GFRAA >60 05/03/2017   GFRNONAA >60 05/03/2017                 Hepatic Function Markers Lab Results  Component Value Date   AST 35 05/03/2017   ALT 30 05/03/2017   ALBUMIN 4.3 05/03/2017   ALKPHOS 65 05/03/2017   LIPASE 30 05/03/2017                 Electrolytes Lab Results  Component Value Date   NA 137 05/03/2017   K 3.2 (L) 05/03/2017   CL 102 05/03/2017   CALCIUM 8.8 (L) 05/03/2017                        Neuropathy  Markers Lab Results  Component Value Date   HGBA1C 6.1 08/16/2014   HIV Non Reactive 06/18/2016                 Bone Pathology Markers No results found for: VD25OH, ZO109UE4VWU, JW1191YN8, GN5621HY8, 25OHVITD1, 25OHVITD2, 25OHVITD3, TESTOFREE, TESTOSTERONE                       Coagulation Parameters Lab Results  Component Value Date   INR 0.96 12/30/2015   LABPROT 12.8 12/30/2015   PLT 258 05/03/2017                 Cardiovascular Markers Lab Results  Component Value Date   TROPONINI <0.03 12/30/2015   HGB 15.1 05/03/2017   HCT 43.2 05/03/2017                 CA Markers No results found for: CEA, CA125, LABCA2               Note: Lab results reviewed.  Recent Diagnostic Imaging Results  DG C-Arm 1-60 Min-No Report Fluoroscopy was utilized by the requesting physician.  No radiographic  interpretation.   Complexity Note: Imaging results reviewed. Results shared with Mr. Orso, using Layman's terms.                         Meds   Current Outpatient Medications:  .  hydrochlorothiazide  (HYDRODIURIL) 25 MG tablet, Take 25 mg by mouth daily., Disp: , Rfl:  .  meclizine (ANTIVERT) 25 MG tablet, Take 1 tablet (25 mg total) by mouth 3 (three) times daily as needed for dizziness., Disp: 20 tablet, Rfl: 0 .  naproxen (NAPROSYN) 500 MG tablet, Take 500 mg by mouth 2 (two) times daily with a meal., Disp: , Rfl:  .  ondansetron (ZOFRAN ODT) 4 MG disintegrating tablet, Take 1 tablet (4 mg total) by mouth every 8 (eight) hours as needed for nausea or vomiting., Disp: 20 tablet, Rfl: 0 .  pravastatin (PRAVACHOL) 10 MG tablet, Take 10 mg by mouth daily., Disp: , Rfl:  .  traZODone (DESYREL) 50 MG tablet, Take 1 tablet (50 mg total) by mouth at bedtime as needed for sleep., Disp: 90 tablet, Rfl: 0 .  Vilazodone HCl (VIIBRYD) 10 MG TABS, Take 1 tablet (10 mg total) by mouth daily., Disp: 90 tablet, Rfl: 0  ROS  Constitutional: Denies any fever or chills Gastrointestinal: No reported hemesis, hematochezia, vomiting, or acute GI distress Musculoskeletal: Denies any acute onset joint swelling, redness, loss of ROM, or weakness Neurological: No reported episodes of acute onset apraxia, aphasia, dysarthria, agnosia, amnesia, paralysis, loss of coordination, or loss of consciousness  Allergies  Mr. Rohrbach is allergic to lisinopril.  PFSH  Drug: Mr. Hoyos  reports that he does not use drugs. Alcohol:  reports that he does not drink alcohol. Tobacco:  reports that  has never smoked. he has never used smokeless tobacco. Medical:  has a past medical history of Abnormal liver function test, Anxiety, Arthritis, Depression, Fatty liver, Heart murmur, Hepatitis B, Hyperlipidemia, Hypertension, Panic attacks, and Vertigo (05/03/2017). Surgical: Mr. Haasch  has a past surgical history that includes Prostate biopsy. Family: family history includes Alcohol abuse in his father and mother; Aortic aneurysm in his father; Bipolar disorder in his brother and sister; Cancer in his father and mother;  Congestive Heart Failure in his father; Diabetes in his mother; Heart disease in his father; Hypertension in his father  and mother; Varicose Veins in his father and mother.  Constitutional Exam  General appearance: Well nourished, well developed, and well hydrated. In no apparent acute distress Vitals:   07/03/17 0913  BP: (!) 152/67  Pulse: 67  Resp: 16  Temp: 97.7 F (36.5 C)  SpO2: 99%  Weight: 264 lb (119.7 kg)  Height: 6\' 2"  (1.88 m)   BMI Assessment: Estimated body mass index is 33.9 kg/m as calculated from the following:   Height as of this encounter: 6\' 2"  (1.88 m).   Weight as of this encounter: 264 lb (119.7 kg).  BMI interpretation table: BMI level Category Range association with higher incidence of chronic pain  <18 kg/m2 Underweight   18.5-24.9 kg/m2 Ideal body weight   25-29.9 kg/m2 Overweight Increased incidence by 20%  30-34.9 kg/m2 Obese (Class I) Increased incidence by 68%  35-39.9 kg/m2 Severe obesity (Class II) Increased incidence by 136%  >40 kg/m2 Extreme obesity (Class III) Increased incidence by 254%   BMI Readings from Last 4 Encounters:  07/03/17 33.90 kg/m  06/11/17 34.02 kg/m  04/03/17 33.64 kg/m  03/05/17 33.64 kg/m   Wt Readings from Last 4 Encounters:  07/03/17 264 lb (119.7 kg)  06/11/17 265 lb (120.2 kg)  04/03/17 262 lb (118.8 kg)  03/05/17 262 lb (118.8 kg)  Psych/Mental status: Alert, oriented x 3 (person, place, & time)       Eyes: PERLA Respiratory: No evidence of acute respiratory distress  Cervical Spine Area Exam  Skin & Axial Inspection: No masses, redness, edema, swelling, or associated skin lesions Alignment: Symmetrical Functional ROM: Unrestricted ROM      Stability: No instability detected Muscle Tone/Strength: Functionally intact. No obvious neuro-muscular anomalies detected. Sensory (Neurological): Unimpaired Palpation: No palpable anomalies              Upper Extremity (UE) Exam    Side: Right upper extremity   Side: Left upper extremity  Skin & Extremity Inspection: Skin color, temperature, and hair growth are WNL. No peripheral edema or cyanosis. No masses, redness, swelling, asymmetry, or associated skin lesions. No contractures.  Skin & Extremity Inspection: Skin color, temperature, and hair growth are WNL. No peripheral edema or cyanosis. No masses, redness, swelling, asymmetry, or associated skin lesions. No contractures.  Functional ROM: Unrestricted ROM          Functional ROM: Unrestricted ROM          Muscle Tone/Strength: Functionally intact. No obvious neuro-muscular anomalies detected.  Muscle Tone/Strength: Functionally intact. No obvious neuro-muscular anomalies detected.  Sensory (Neurological): Unimpaired          Sensory (Neurological): Unimpaired          Palpation: No palpable anomalies              Palpation: No palpable anomalies              Specialized Test(s): Deferred         Specialized Test(s): Deferred          Thoracic Spine Area Exam  Skin & Axial Inspection: No masses, redness, or swelling Alignment: Symmetrical Functional ROM: Unrestricted ROM Stability: No instability detected Muscle Tone/Strength: Functionally intact. No obvious neuro-muscular anomalies detected. Sensory (Neurological): Unimpaired Muscle strength & Tone: No palpable anomalies  Lumbar Spine Area Exam  Skin & Axial Inspection: No masses, redness, or swelling Alignment: Symmetrical Functional ROM: Unrestricted ROM      Stability: No instability detected Muscle Tone/Strength: Functionally intact. No obvious neuro-muscular anomalies detected. Sensory (Neurological):  Unimpaired Palpation: No palpable anomalies       Provocative Tests: Lumbar Hyperextension and rotation test: Positive bilaterally for facet joint pain. Lumbar Lateral bending test: Positive       Patrick's Maneuver: Positive for bilateral S-I arthralgia              Gait & Posture Assessment  Ambulation: Unassisted Gait: Relatively  normal for age and body habitus Posture: WNL   Lower Extremity Exam    Side: Right lower extremity  Side: Left lower extremity  Skin & Extremity Inspection: Skin color, temperature, and hair growth are WNL. No peripheral edema or cyanosis. No masses, redness, swelling, asymmetry, or associated skin lesions. No contractures.  Skin & Extremity Inspection: Skin color, temperature, and hair growth are WNL. No peripheral edema or cyanosis. No masses, redness, swelling, asymmetry, or associated skin lesions. No contractures.  Functional ROM: Unrestricted ROM          Functional ROM: Unrestricted ROM          Muscle Tone/Strength: Functionally intact. No obvious neuro-muscular anomalies detected.  Muscle Tone/Strength: Functionally intact. No obvious neuro-muscular anomalies detected.  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 Diagnoses of Lumbar spondylosis, Lumbar degenerative disc disease, Primary osteoarthritis involving multiple joints, and Lumbar facet arthropathy were also pertinent to this visit.  Status Diagnosis  Controlled Controlled Controlled 1. SI joint arthritis (HCC)   2. Lumbar spondylosis   3. Lumbar degenerative disc disease   4. Primary osteoarthritis involving multiple joints   5. Lumbar facet arthropathy      62 year old male with a history of axial low back pain status post bilateral radiofrequency ablation of L3, L4, L5 on 2 different occasions who presents for postprocedural follow-up.  Patient overall notes improvement in his axial low back pain and buttock symptoms since the radiofrequency ablation.  He notes greater ease of performing activities of daily living.  He states that he is able to ambulate for an extended period of time without having as much  pain.  Patient is endorsing buttock pain that occasionally radiates into his groin.  He does have a positive Patrick's bilaterally suggesting SI joint arthritis.  We reviewed various SI joint exercises that he can perform.  I provided him with online resources that he can reference to help with stretching exercises to open up his SI joint.  If these are not effective, we can consider SI joint injections in the future.  Plan: -Reviewed exercises and provided online resources that the patient can reference to perform SI joint exercises -Consideration of SI joint block in the future for SI joint arthritis.   Provider-requested follow-up: Return in about 7 weeks (around 08/21/2017) for Medication Management. Time Note: Greater than 50% of the 15 minute(s) of face-to-face time spent with Mr. Ramones, was spent in counseling/coordination of care regarding: Mr. Schaner's primary cause of pain, the treatment plan, treatment alternatives, going over the informed consent, the results, interpretation and significance of  his recent diagnostic interventional treatment(s) and realistic expectations. Future Appointments  Date Time Provider Department Center  07/31/2017  2:00 PM Marinda Elk, [place] [facility] None  08/21/2017  9:45 AM Kyle Jolly, MD [facility] None  09/03/2017  2:30 PM Jomarie Longs, MD [facility] None    Primary Care Physician: Kyle Desanctis, MD Location: [facility] Note  by: Kyle Hood, M.D Date: 07/03/2017; Time: 12:47 PM  There are no Patient Instructions on file for this visit.

## 2017-07-03 NOTE — Progress Notes (Signed)
Safety precautions to be maintained throughout the outpatient stay will include: orient to surroundings, keep bed in low position, maintain call bell within reach at all times, provide assistance with transfer out of bed and ambulation.  

## 2017-07-29 NOTE — Progress Notes (Signed)
THERAPIST PROGRESS NOTE   Date of Service:   07/02/2017  Session Time:   1 hour  Patient:   Kyle Hood   DOB:   22-Mar-1956  MR Number:  [id number]  Location:  [facility] [address] [place] [address] Dept: [phone]            Provider/Observer:  Marinda Elk Counselor  Risk of Suicide/Violence: virtually non-existent   Diagnosis:    MDD (major depressive disorder), recurrent episode, mild (HCC)  Panic disorder  Type of Therapy: Individual Therapy  Treatment Goals addressed: Anxiety  Participation Level: Active   Interventions: CBT and Motivational Interviewing   Behavioral Response: CasualAlertEuthymic   Summary: Therapist met with Patient in an initial therapy session to assess current mood and to build rapport. Therapist engaged Patient in discussion about life and what is going well. Therapist provided support for Patient as he shared details about his life, his current stressors, mood, coping skills, his past. Therapist prompted Patient to discuss his support system and ways that he manages daily stress, anger, and frustrations.      Plan: Kyle Hood will journal daily   Return again in 2weeks.

## 2017-07-31 ENCOUNTER — Ambulatory Visit (INDEPENDENT_AMBULATORY_CARE_PROVIDER_SITE_OTHER): Payer: Medicare Other | Admitting: Licensed Clinical Social Worker

## 2017-07-31 DIAGNOSIS — F330 Major depressive disorder, recurrent, mild: Secondary | ICD-10-CM

## 2017-07-31 DIAGNOSIS — F410 Panic disorder [episodic paroxysmal anxiety] without agoraphobia: Secondary | ICD-10-CM | POA: Diagnosis not present
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
## 2017-08-18 ENCOUNTER — Telehealth: Payer: Self-pay | Admitting: *Deleted

## 2017-08-18 NOTE — Telephone Encounter (Signed)
Mr. Kyle Hood called to ask why he is coming for appt on 08-22-27. The order from last visit says to return for med management. We do not give him meds of any type. He is doing great after last esi. Can he cancel and come prn?

## 2017-08-21 ENCOUNTER — Encounter: Payer: Medicare Other | Admitting: Student in an Organized Health Care Education/Training Program

## 2017-09-03 ENCOUNTER — Other Ambulatory Visit: Payer: Self-pay

## 2017-09-03 ENCOUNTER — Encounter: Payer: Self-pay | Admitting: Psychiatry

## 2017-09-03 ENCOUNTER — Ambulatory Visit (INDEPENDENT_AMBULATORY_CARE_PROVIDER_SITE_OTHER): Payer: Medicare Other | Admitting: Psychiatry

## 2017-09-03 ENCOUNTER — Ambulatory Visit: Payer: Medicare Other | Admitting: Licensed Clinical Social Worker

## 2017-09-03 VITALS — BP 168/88 | HR 81 | Temp 98.6°F | Wt 262.4 lb

## 2017-09-03 DIAGNOSIS — F640 Transsexualism: Secondary | ICD-10-CM | POA: Diagnosis not present

## 2017-09-03 DIAGNOSIS — F4010 Social phobia, unspecified: Secondary | ICD-10-CM | POA: Diagnosis not present

## 2017-09-03 DIAGNOSIS — F410 Panic disorder [episodic paroxysmal anxiety] without agoraphobia: Secondary | ICD-10-CM

## 2017-09-03 DIAGNOSIS — F330 Major depressive disorder, recurrent, mild: Secondary | ICD-10-CM | POA: Diagnosis not present

## 2017-09-03 MED ORDER — VILAZODONE HCL 20 MG PO TABS: 20.0000 mg | ORAL_TABLET | Freq: Every day | ORAL | 0 refills | Status: DC

## 2017-09-03 MED ORDER — QUETIAPINE FUMARATE 25 MG PO TABS: 25.0000 mg | ORAL_TABLET | Freq: Every day | ORAL | 2 refills | Status: DC

## 2017-09-03 NOTE — Patient Instructions (Signed)
Quetiapine tablets What is this medicine? QUETIAPINE (kwe TYE a peen) is an antipsychotic. It is used to treat schizophrenia and bipolar disorder, also known as manic-depression. This medicine may be used for other purposes; ask your health care provider or pharmacist if you have questions. COMMON BRAND NAME(S): Seroquel What should I tell my health care provider before I take this medicine? They need to know if you have any of these conditions: -brain tumor or head injury -breast cancer -cataracts -diabetes -difficulty swallowing -heart disease -kidney disease -liver disease -low blood counts, like low white cell, platelet, or red cell counts -low blood pressure or dizziness when standing up -Parkinson's disease -previous heart attack -seizures -suicidal thoughts, plans, or attempt by you or a family member -thyroid disease -an unusual or allergic reaction to quetiapine, other medicines, foods, dyes, or preservatives -pregnant or trying to get pregnant -breast-feeding How should I use this medicine? Take this medicine by mouth. Swallow it with a drink of water. Follow the directions on the prescription label. If it upsets your stomach you can take it with food. Take your medicine at regular intervals. Do not take it more often than directed. Do not stop taking except on the advice of your doctor or health care professional. A special MedGuide will be given to you by the pharmacist with each prescription and refill. Be sure to read this information carefully each time. Talk to your pediatrician regarding the use of this medicine in children. While this drug may be prescribed for children as young as 10 years for selected conditions, precautions do apply. Patients over age 61 years may have a stronger reaction to this medicine and need smaller doses. Overdosage: If you think you have taken too much of this medicine contact a poison control center or emergency room at once. NOTE: This  medicine is only for you. Do not share this medicine with others. What if I miss a dose? If you miss a dose, take it as soon as you can. If it is almost time for your next dose, take only that dose. Do not take double or extra doses. What may interact with this medicine? Do not take this medicine with any of the following medications: -certain medicines for fungal infections like fluconazole, itraconazole, ketoconazole, posaconazole, voriconazole -cisapride -dofetilide -dronedarone -droperidol -grepafloxacin -halofantrine -phenothiazines like chlorpromazine, mesoridazine, thioridazine -pimozide -sparfloxacin -ziprasidone This medicine may also interact with the following medications: -alcohol -antiviral medicines for HIV or AIDS -certain medicines for blood pressure -certain medicines for depression, anxiety, or psychotic disturbances like haloperidol, lorazepam -certain medicines for diabetes -certain medicines for Parkinson's disease -certain medicines for seizures like carbamazepine, phenobarbital, phenytoin -cimetidine -erythromycin -other medicines that prolong the QT interval (cause an abnormal heart rhythm) -rifampin -steroid medicines like prednisone or cortisone This list may not describe all possible interactions. Give your health care provider a list of all the medicines, herbs, non-prescription drugs, or dietary supplements you use. Also tell them if you smoke, drink alcohol, or use illegal drugs. Some items may interact with your medicine. What should I watch for while using this medicine? Visit your doctor or health care professional for regular checks on your progress. It may be several weeks before you see the full effects of this medicine. Your health care provider may suggest that you have your eyes examined prior to starting this medicine, and every 6 months thereafter. If you have been taking this medicine regularly for some time, do not suddenly stop taking it.  You must gradually   reduce the dose or your symptoms may get worse. Ask your doctor or health care professional for advice. Patients and their families should watch out for worsening depression or thoughts of suicide. Also watch out for sudden or severe changes in feelings such as feeling anxious, agitated, panicky, irritable, hostile, aggressive, impulsive, severely restless, overly excited and hyperactive, or not being able to sleep. If this happens, especially at the beginning of antidepressant treatment or after a change in dose, call your health care professional. You may get dizzy or drowsy. Do not drive, use machinery, or do anything that needs mental alertness until you know how this medicine affects you. Do not stand or sit up quickly, especially if you are an older patient. This reduces the risk of dizzy or fainting spells. Alcohol can increase dizziness and drowsiness. Avoid alcoholic drinks. Do not treat yourself for colds, diarrhea or allergies. Ask your doctor or health care professional for advice, some ingredients may increase possible side effects. This medicine can reduce the response of your body to heat or cold. Dress warm in cold weather and stay hydrated in hot weather. If possible, avoid extreme temperatures like saunas, hot tubs, very hot or cold showers, or activities that can cause dehydration such as vigorous exercise. What side effects may I notice from receiving this medicine? Side effects that you should report to your doctor or health care professional as soon as possible: -allergic reactions like skin rash, itching or hives, swelling of the face, lips, or tongue -difficulty swallowing -fast or irregular heartbeat -fever or chills, sore throat -fever with rash, swollen lymph nodes, or swelling of the face -increased hunger or thirst -increased urination -problems with balance, talking, walking -seizures -stiff muscles -suicidal thoughts or other mood  changes -uncontrollable head, mouth, neck, arm, or leg movements -unusually weak or tired Side effects that usually do not require medical attention (report to your doctor or health care professional if they continue or are bothersome): -change in sex drive or performance -constipation -drowsy or dizzy -dry mouth -stomach upset -weight gain This list may not describe all possible side effects. Call your doctor for medical advice about side effects. You may report side effects to FDA at 1-800-FDA-1088. Where should I keep my medicine? Keep out of the reach of children. Store at room temperature between 15 and 30 degrees C (59 and 86 degrees F). Throw away any unused medicine after the expiration date. NOTE: This sheet is a summary. It may not cover all possible information. If you have questions about this medicine, talk to your doctor, pharmacist, or health care provider.  2018 Elsevier/Gold Standard (2014-10-18 13:07:35)  
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
## 2017-09-04 ENCOUNTER — Encounter: Payer: Self-pay | Admitting: Psychiatry

## 2017-09-17 ENCOUNTER — Encounter: Payer: Self-pay | Admitting: Psychiatry

## 2017-09-17 ENCOUNTER — Other Ambulatory Visit: Payer: Self-pay

## 2017-09-17 ENCOUNTER — Ambulatory Visit (INDEPENDENT_AMBULATORY_CARE_PROVIDER_SITE_OTHER): Payer: Medicare Other | Admitting: Licensed Clinical Social Worker

## 2017-09-17 ENCOUNTER — Ambulatory Visit (INDEPENDENT_AMBULATORY_CARE_PROVIDER_SITE_OTHER): Payer: Medicare Other | Admitting: Psychiatry

## 2017-09-17 VITALS — BP 173/95 | HR 80 | Temp 97.9°F | Wt 261.6 lb

## 2017-09-17 DIAGNOSIS — F4010 Social phobia, unspecified: Secondary | ICD-10-CM

## 2017-09-17 DIAGNOSIS — F330 Major depressive disorder, recurrent, mild: Secondary | ICD-10-CM

## 2017-09-17 DIAGNOSIS — F410 Panic disorder [episodic paroxysmal anxiety] without agoraphobia: Secondary | ICD-10-CM

## 2017-09-17 DIAGNOSIS — F640 Transsexualism: Secondary | ICD-10-CM

## 2017-09-17 NOTE — Progress Notes (Signed)
BH MD OP Progress Note  09/17/2017 4:23 PM Kyle Hood  MRN:  [id number]  Chief Complaint: ' I am here for follow up." Chief Complaint    Follow-up; Medication Refill; Stress     HPI: Kyle Hood is a 62 year old transgender male to male, lives in [place], has a history of depression, panic disorder, social anxiety disorder, presented to the clinic today for a follow-up visit.  Patient today reports  psychosocial stressors of financial as well as  partner's health problems.  Patient reports that friend had a hernia surgery done and hence is currently stressed out about the health care bills.    Patient reports when that when the Viibryd was initially taken it felt like the energy level was high.  However it feels much better now.  Patient continues to take Seroquel as needed.  Discussed with patient to take Viibryd and Seroquel daily.  Patient denies any side effects to the medication.  Patient denies any suicidality.  Patient denies any perceptual disturbances.  Patient continues to be in psychotherapy with Ms. Kerby Nora which is going well.  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  These kind of activities are relaxing to the patient. Visit Diagnosis:    ICD-10-CM   1. MDD (major depressive disorder), recurrent episode, mild (HCC) F33.0   2. Panic disorder F41.0   3. Social anxiety disorder F40.10   4. 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 F64.0     Past Psychiatric History: Reviewed past psychiatric history from my progress note on 06/05/2017.  Past trials of Zoloft, Wellbutrin.  Past Medical History:  Past Medical History:  Diagnosis Date  . Abnormal liver function test   . Anxiety   . Arthritis   . Depression   . Fatty liver   . Heart murmur   . Hepatitis B   . Hyperlipidemia   . Hypertension   . Panic attacks   . Vertigo 05/03/2017   ER    Past Surgical History:  Procedure Laterality Date  . PROSTATE BIOPSY      Family  Psychiatric History: Reviewed family psychiatric history from my progress note on 06/05/2017.  Family History:  Family History  Problem Relation Age of Onset  . Cancer Mother        Pancreatic  . Diabetes Mother   . Hypertension Mother   . Varicose Veins Mother   . Alcohol abuse Mother   . Heart disease Father        CABG  . Cancer Father        Jaw  . Aortic aneurysm Father   . Congestive Heart Failure Father   . Hypertension Father   . Varicose Veins Father   . Alcohol abuse Father   . Bipolar disorder Sister   . Bipolar disorder Brother     Social History: Reviewed social history from my progress note on 06/05/2017. Social History   Socioeconomic History  . Marital status: Significant Other    Spouse name: denise  . Number of children: 0  . Years of education: Not on file  . Highest education level: 11th grade  Occupational History    Comment: disabled  Social Needs  . Financial resource strain: Very hard  . Food insecurity:    Worry: Never true    Inability: Never true  . Transportation needs:    Medical: No    Non-medical: No  Tobacco Use  . Smoking status: Never Smoker  . Smokeless tobacco: Never  Used  Substance and Sexual Activity  . Alcohol use: No    Frequency: Never    Comment: Occasional wine   . Drug use: No  . Sexual activity: Not Currently  Lifestyle  . Physical activity:    Days per week: 0 days    Minutes per session: 0 min  . Stress: Not on file  Relationships  . Social connections:    Talks on phone: Twice a week    Gets together: Never    Attends religious service: Never    Active member of club or organization: No    Attends meetings of clubs or organizations: Never    Relationship status: Living with partner  Other Topics Concern  . Not on file  Social History Narrative  . Not on file    Allergies:  Allergies  Allergen Reactions  . Lisinopril Cough    Metabolic Disorder Labs: Lab Results  Component Value Date   HGBA1C  6.1 08/16/2014   No results found for: PROLACTIN Lab Results  Component Value Date   CHOL 145 08/16/2014   TRIG 320 (A) 08/16/2014   HDL 36 08/16/2014   LDLCALC 45 08/16/2014   Lab Results  Component Value Date   TSH 1.61 08/16/2014    Therapeutic Level Labs: No results found for: LITHIUM No results found for: VALPROATE No components found for:  CBMZ  Current Medications: Current Outpatient Medications  Medication Sig Dispense Refill  . hydrochlorothiazide (HYDRODIURIL) 25 MG tablet Take 25 mg by mouth daily.    . meclizine (ANTIVERT) 25 MG tablet Take 1 tablet (25 mg total) by mouth 3 (three) times daily as needed for dizziness. 20 tablet 0  . naproxen (NAPROSYN) 500 MG tablet Take 500 mg by mouth 2 (two) times daily with a meal.    . ondansetron (ZOFRAN ODT) 4 MG disintegrating tablet Take 1 tablet (4 mg total) by mouth every 8 (eight) hours as needed for nausea or vomiting. 20 tablet 0  . pravastatin (PRAVACHOL) 10 MG tablet Take 10 mg by mouth daily.    . QUEtiapine (SEROQUEL) 25 MG tablet Take 1 tablet (25 mg total) by mouth at bedtime. Mood , sleep 30 tablet 2  . traZODone (DESYREL) 50 MG tablet Take 1 tablet (50 mg total) by mouth at bedtime as needed for sleep. 90 tablet 0  . Vilazodone HCl 20 MG TABS Take 1 tablet (20 mg total) by mouth daily. 90 tablet 0   No current facility-administered medications for this visit.      Musculoskeletal: Strength & Muscle Tone: within normal limits Gait & Station: normal Patient leans: N/A  Psychiatric Specialty Exam: Review of Systems  Psychiatric/Behavioral: The patient is nervous/anxious.   All other systems reviewed and are negative.   Blood pressure (!) 173/95, pulse 80, temperature 97.9 F (36.6 C), temperature source Oral, weight 261 lb 9.6 oz (118.7 kg).Body mass index is 33.59 kg/m.  General Appearance: Casual  Eye Contact:  Fair  Speech:  Normal Rate  Volume:  Normal  Mood:  Anxious  Affect:  Appropriate   Thought Process:  Goal Directed and Descriptions of Associations: Intact  Orientation:  Full (Time, Place, and Person)  Thought Content: Logical   Suicidal Thoughts:  No  Homicidal Thoughts:  No  Memory:  Immediate;   Fair Recent;   Fair Remote;   Fair  Judgement:  Fair  Insight:  Fair  Psychomotor Activity:  Normal  Concentration:  Concentration: Fair and Attention Span: Fair  Recall:  Fair  Fund of Knowledge: Fair  Language: Fair  Akathisia:  No  Handed:  Right  AIMS (if indicated): 0  Assets:  Communication Skills [employer] for Improvement Talents/Skills  ADL's:  Intact  Cognition: WNL  Sleep:  Fair   Screenings: PHQ2-9     Office Visit from 07/03/2017 in [facility] Procedure visit from 06/11/2017 in [facility] Office Visit from 04/03/2017 in [facility] Procedure visit from 03/05/2017 in [facility] Office Visit from 02/13/2017 in [facility]  PHQ-2 Total Score  0  0  0  0  0       Assessment and Plan: Kyle Hood is a 62 year old male to male transgender with history of depression, anxiety, presented to the clinic today for a follow-up visit.  Kyle Hood is currently going through psychosocial stressors of financial problem health problems in his partner and so on.  Patient continues to be in psychotherapy which is going well.  Plan as noted below.  Plan For depression Continue Viibryd 20 mg p.o. daily Discussed with patient to start taking Seroquel 25 mg p.o. nightly daily instead of as needed.  For anxiety symptoms Continue Viibryd 20 mg p.o. daily Continue CBT  Insomnia Continue Seroquel 25 mg p.o. nightly, discussed with patient to take it every night.  Provided supportive psychotherapy.  Follow-up in clinic in 4 weeks or sooner if needed.  More than 50 %  of the time was spent for psychoeducation and supportive psychotherapy and care coordination.  This note was generated in part or whole with voice recognition software. Voice recognition is usually quite accurate but there are transcription errors that can and very often do occur. I apologize for any typographical errors that were not detected and corrected.       Jomarie Longs, MD 09/18/2017, 8:26 AM

## 2017-09-18 ENCOUNTER — Encounter: Payer: Self-pay | Admitting: Psychiatry

## 2017-09-27 ENCOUNTER — Other Ambulatory Visit: Payer: Self-pay | Admitting: Psychiatry

## 2017-09-30 ENCOUNTER — Encounter: Payer: Self-pay | Admitting: *Deleted

## 2017-10-01 ENCOUNTER — Other Ambulatory Visit: Payer: Self-pay

## 2017-10-01 ENCOUNTER — Encounter
Admission: RE | Disposition: A | Discharge status: Home / Self Care | Payer: Self-pay | Source: Ambulatory Visit | Attending: Internal Medicine

## 2017-10-01 ENCOUNTER — Ambulatory Visit: Payer: Medicare Other | Admitting: Anesthesiology

## 2017-10-01 ENCOUNTER — Encounter: Payer: Self-pay | Admitting: *Deleted

## 2017-10-01 ENCOUNTER — Ambulatory Visit
Admission: RE | Admit: 2017-10-01 | Discharge: 2017-10-01 | Disposition: A | Discharge status: Home / Self Care | Payer: Medicare Other | Source: Ambulatory Visit | Attending: Internal Medicine | Admitting: Internal Medicine

## 2017-10-01 DIAGNOSIS — Z79899 Other long term (current) drug therapy: Secondary | ICD-10-CM | POA: Diagnosis not present

## 2017-10-01 DIAGNOSIS — Z1211 Encounter for screening for malignant neoplasm of colon: Secondary | ICD-10-CM | POA: Diagnosis not present

## 2017-10-01 DIAGNOSIS — K760 Fatty (change of) liver, not elsewhere classified: Secondary | ICD-10-CM | POA: Insufficient documentation

## 2017-10-01 DIAGNOSIS — E785 Hyperlipidemia, unspecified: Secondary | ICD-10-CM | POA: Insufficient documentation

## 2017-10-01 DIAGNOSIS — Z86010 Personal history of colonic polyps: Secondary | ICD-10-CM | POA: Insufficient documentation

## 2017-10-01 HISTORY — DX: Polyneuropathy, unspecified: G62.9

## 2017-10-01 HISTORY — DX: Other chronic pain: G89.29

## 2017-10-01 HISTORY — DX: Low back pain, unspecified: M54.50

## 2017-10-01 HISTORY — PX: COLONOSCOPY WITH PROPOFOL: SHX5780

## 2017-10-01 HISTORY — DX: Low back pain: M54.5

## 2017-10-01 HISTORY — DX: Radiculopathy, lumbar region: M54.16

## 2017-10-01 HISTORY — DX: Unspecified osteoarthritis, unspecified site: M19.90

## 2017-10-01 SURGERY — COLONOSCOPY WITH PROPOFOL
Anesthesia: General

## 2017-10-01 MED ORDER — SODIUM CHLORIDE 0.9 % IV SOLN
INTRAVENOUS | Status: DC
  Administered 2017-10-01 (×2): via INTRAVENOUS

## 2017-10-01 MED ORDER — PROPOFOL 500 MG/50ML IV EMUL
INTRAVENOUS | Status: AC
  Filled 2017-10-01: qty 50

## 2017-10-01 MED ORDER — PROPOFOL 10 MG/ML IV BOLUS
INTRAVENOUS | Status: DC | PRN
  Administered 2017-10-01: 30 mg via INTRAVENOUS
  Administered 2017-10-01 (×2): 50 mg via INTRAVENOUS

## 2017-10-01 MED ORDER — PROPOFOL 500 MG/50ML IV EMUL
INTRAVENOUS | Status: DC | PRN
  Administered 2017-10-01: 80 ug/kg/min via INTRAVENOUS

## 2017-10-01 NOTE — Anesthesia Preprocedure Evaluation (Signed)
Anesthesia Evaluation  Patient identified by MRN, date of birth, ID band Patient awake    Reviewed: Allergy & Precautions, H&P , NPO status , Patient's Chart, lab work & pertinent test results, reviewed documented beta blocker date and time   History of Anesthesia Complications Negative for: history of anesthetic complications  Airway Mallampati: II  TM Distance: >3 FB Neck ROM: full    Dental  (+) Missing, Dental Advidsory Given, Teeth Intact   Pulmonary neg pulmonary ROS,           Cardiovascular Exercise Tolerance: Good hypertension, (-) angina(-) CAD, (-) Past MI, (-) Cardiac Stents and (-) CABG (-) dysrhythmias + Valvular Problems/Murmurs      Neuro/Psych PSYCHIATRIC DISORDERS Anxiety Depression negative neurological ROS     GI/Hepatic negative GI ROS, (+) Hepatitis -, BNAFLD   Endo/Other  negative endocrine ROS  Renal/GU negative Renal ROS  negative genitourinary   Musculoskeletal   Abdominal   Peds  Hematology negative hematology ROS (+)   Anesthesia Other Findings Past Medical History: No date: Abnormal liver function test No date: Anxiety No date: Anxiety No date: Arthritis No date: Chronic lower back pain No date: Degenerative joint disease No date: Depression No date: Fatty liver No date: Heart murmur No date: Hepatitis B No date: Hyperlipidemia No date: Hypertension No date: Lumbar polyradiculopathy No date: Panic attacks No date: Polyneuropathy 05/03/2017: Vertigo     Comment:  ER   Reproductive/Obstetrics negative OB ROS                             Anesthesia Physical Anesthesia Plan  ASA: II  Anesthesia Plan: General   Post-op Pain Management:    Induction: Intravenous  PONV Risk Score and Plan: 2 and Propofol infusion  Airway Management Planned: Nasal Cannula  Additional Equipment:   Intra-op Plan:   Post-operative Plan:   Informed Consent: I  have reviewed the patients History and Physical, chart, labs and discussed the procedure including the risks, benefits and alternatives for the proposed anesthesia with the patient or authorized representative who has indicated his/her understanding and acceptance.   Dental Advisory Given  Plan Discussed with: Anesthesiologist, CRNA and Surgeon  Anesthesia Plan Comments:         Anesthesia Quick Evaluation

## 2017-10-01 NOTE — H&P (Signed)
  Outpatient short stay form Pre-procedure 10/01/2017 9:49 AM Kyle Hood, M.D.  Primary Physician: Orene DesanctisKaren Hood, M.D.  Reason for visit: Personal hx of colon polyps.  History of present illness:  Patient with personal hx of colon polyps; last colonoscopy in [place], [place] c. 2012 with recommendation for 5 yr repeat. Denies rectal bleeding, abdominal pain, weight loss.    Current Facility-Administered Medications:  .  0.9 %  sodium chloride infusion, , Intravenous, Continuous, Goodricholedo, [name], MD, Last Rate: 20 mL/hr at 10/01/17 [id number]  Medications Prior to Admission  Medication Sig Dispense Refill Last Dose  . clonazePAM (KLONOPIN) 0.5 MG tablet Take 0.5 mg by mouth daily.   Past Month at Unknown time  . hydrochlorothiazide (HYDRODIURIL) 25 MG tablet Take 25 mg by mouth daily.   10/01/2017 at 0630  . naproxen (NAPROSYN) 500 MG tablet Take 500 mg by mouth 2 (two) times daily with a meal.   Past Week at Unknown time  . pravastatin (PRAVACHOL) 10 MG tablet Take 10 mg by mouth daily.   09/30/2017 at 2100  . QUEtiapine (SEROQUEL) 25 MG tablet Take 1 tablet (25 mg total) by mouth at bedtime. Mood , sleep 30 tablet 2 09/30/2017 at 2100  . VIIBRYD 20 MG TABS TAKE 1 TABLET(20 MG) BY MOUTH DAILY 90 tablet 0 09/30/2017 at 0900  . meclizine (ANTIVERT) 25 MG tablet Take 1 tablet (25 mg total) by mouth 3 (three) times daily as needed for dizziness. (Patient not taking: Reported on 10/01/2017) 20 tablet 0 Not Taking at Unknown time  . ondansetron (ZOFRAN ODT) 4 MG disintegrating tablet Take 1 tablet (4 mg total) by mouth every 8 (eight) hours as needed for nausea or vomiting. (Patient not taking: Reported on 10/01/2017) 20 tablet 0 Not Taking at Unknown time  . traZODone (DESYREL) 50 MG tablet Take 1 tablet (50 mg total) by mouth at bedtime as needed for sleep. (Patient not taking: Reported on 10/01/2017) 90 tablet 0 Not Taking at Unknown time     Allergies  Allergen Reactions  . Lisinopril Cough     Past  Medical History:  Diagnosis Date  . Abnormal liver function test   . Anxiety   . Anxiety   . Arthritis   . Chronic lower back pain   . Degenerative joint disease   . Depression   . Fatty liver   . Heart murmur   . Hepatitis B   . Hyperlipidemia   . Hypertension   . Lumbar polyradiculopathy   . Panic attacks   . Polyneuropathy   . Vertigo 05/03/2017   ER    Review of systems:   Otherwise negative.    Physical Exam  Gen: Alert, oriented. Appears stated age.  HEENT: [place]/AT. PERRLA. Lungs: CTA, no wheezes. CV: RR nl S1, S2. Abd: soft, benign, no masses. BS+ Ext: No edema. Pulses 2+    Planned procedures: Proceed with colonoscopy. The patient understands the nature of the planned procedure, indications, risks, alternatives and potential complications including but not limited to bleeding, infection, perforation, damage to internal organs and possible oversedation/side effects from anesthesia. The patient agrees and gives consent to proceed.  Please refer to procedure notes for findings, recommendations and patient disposition/instructions.    Kyle Hood, M.D. Gastroenterology 10/01/2017  9:49 AM

## 2017-10-01 NOTE — Op Note (Signed)
[facility] Patient Name: Kyle Hood Procedure Date: 10/01/2017 9:51 AM MRN: [id number] Account #: [id number] Date of Birth: 07/04/1955 Admit Type: Outpatient Age: 62 Room: [facility] ENDO ROOM 3 Gender: Male Note Status: Finalized Procedure:            Colonoscopy Indications:          High risk colon cancer surveillance: Personal history                        of colonic polyps Providers:            Boykin Nearing. Norma Fredrickson MD, MD Referring MD:         Daniel Nones, MD (Referring MD) Medicines:            Propofol per Anesthesia Complications:        No immediate complications. Procedure:            Pre-Anesthesia Assessment:                       - The risks and benefits of the procedure and the                        sedation options and risks were discussed with the                        patient. All questions were answered and informed                        consent was obtained.                       - Patient identification and proposed procedure were                        verified prior to the procedure by the nurse. The                        procedure was verified in the procedure room.                       - ASA Grade Assessment: II - A patient with mild                        systemic disease.                       - After reviewing the risks and benefits, the patient                        was deemed in satisfactory condition to undergo the                        procedure.                       After obtaining informed consent, the colonoscope was                        passed under direct vision. Throughout the procedure,  the patient's blood pressure, pulse, and oxygen                        saturations were monitored continuously. The                        Colonoscope was introduced through the anus and                        advanced to the the cecum, identified by appendiceal                        orifice  and ileocecal valve. The colonoscopy was                        performed without difficulty. The patient tolerated the                        procedure well. The quality of the bowel preparation                        was good. The ileocecal valve, appendiceal orifice, and                        rectum were photographed. Findings:      The perianal and digital rectal examinations were normal. Pertinent       negatives include normal sphincter tone and no palpable rectal lesions.      The colon (entire examined portion) appeared normal.      The exam was otherwise without abnormality on direct and retroflexion       views. Impression:           - The entire examined colon is normal.                       - The examination was otherwise normal on direct and                        retroflexion views.                       - No specimens collected. Recommendation:       - Patient has a contact number available for                        emergencies. The signs and symptoms of potential                        delayed complications were discussed with the patient.                        Return to normal activities tomorrow. Written discharge                        instructions were provided to the patient.                       - Resume previous diet.                       - Continue present medications.                       -  Repeat colonoscopy in 10 years for screening purposes.                       - The findings and recommendations were discussed with                        the patient and their family. Procedure Code(s):    --- Professional ---                       O9629, Colorectal cancer screening; colonoscopy on                        individual at high risk Diagnosis Code(s):    --- Professional ---                       Z86.010, Personal history of colonic polyps CPT copyright 2017 American Medical Association. All rights reserved. The codes documented in this report are  preliminary and upon coder review may  be revised to meet current compliance requirements. Stanton Kidney MD, MD 10/01/2017 10:16:14 AM This report has been signed electronically. Number of Addenda: 0 Note Initiated On: 10/01/2017 9:51 AM Scope Withdrawal Time: 0 hours 6 minutes 47 seconds  Total Procedure Duration: 0 hours 9 minutes 54 seconds       [facility]

## 2017-10-01 NOTE — Interval H&P Note (Signed)
History and Physical Interval Note:  10/01/2017 9:51 AM  Kyle LordsAlan Brummet  has presented today for surgery, with the diagnosis of HX POLYPS  The various methods of treatment have been discussed with the patient and family. After consideration of risks, benefits and other options for treatment, the patient has consented to  Procedure(s): COLONOSCOPY WITH PROPOFOL (N/A) as a surgical intervention .  The patient's history has been reviewed, patient examined, no change in status, stable for surgery.  I have reviewed the patient's chart and labs.  Questions were answered to the patient's satisfaction.     [name], [name]

## 2017-10-01 NOTE — Transfer of Care (Signed)
Immediate Anesthesia Transfer of Care Note  Patient: Kyle Hood  Procedure(s) Performed: COLONOSCOPY WITH PROPOFOL (N/A )  Patient Location: PACU  Anesthesia Type:General  Level of Consciousness: awake  Airway & Oxygen Therapy: Patient Spontanous Breathing  Post-op Assessment: Report given to RN  Post vital signs: stable  Last Vitals:  Vitals Value Taken Time  BP 140/78 10/01/2017 10:18 AM  Temp 36.1 C 10/01/2017 10:18 AM  Pulse 73 10/01/2017 10:19 AM  Resp 18 10/01/2017 10:19 AM  SpO2 98 % 10/01/2017 10:19 AM  Vitals shown include unvalidated device data.  Last Pain:  Vitals:   10/01/17 1018  TempSrc:   PainSc: 0-No pain         Complications: No apparent anesthesia complications

## 2017-10-01 NOTE — Anesthesia Postprocedure Evaluation (Signed)
Anesthesia Post Note  Patient: Kyle LordsAlan Staron  Procedure(s) Performed: COLONOSCOPY WITH PROPOFOL (N/A )  Patient location during evaluation: Endoscopy Anesthesia Type: General Level of consciousness: awake and alert Pain management: pain level controlled Vital Signs Assessment: post-procedure vital signs reviewed and stable Respiratory status: spontaneous breathing, nonlabored ventilation, respiratory function stable and patient connected to nasal cannula oxygen Cardiovascular status: blood pressure returned to baseline and stable Postop Assessment: no apparent nausea or vomiting Anesthetic complications: no     Last Vitals:  Vitals:   10/01/17 1028 10/01/17 1038  BP: 132/87 130/87  Pulse: 77 70  Resp: 19 15  Temp:    SpO2: 99% 99%    Last Pain:  Vitals:   10/01/17 1038  TempSrc:   PainSc: 0-No pain                 Lenard SimmerAndrew Magdalene Tardiff

## 2017-10-01 NOTE — Anesthesia Post-op Follow-up Note (Signed)
Anesthesia QCDR form completed.        

## 2017-10-02 ENCOUNTER — Encounter: Payer: Self-pay | Admitting: Internal Medicine

## 2017-10-04 ENCOUNTER — Other Ambulatory Visit: Payer: Self-pay | Admitting: Psychiatry

## 2017-10-28 NOTE — Progress Notes (Signed)
   THERAPIST PROGRESS NOTE  Session Time: 52  Participation Level: Active  Behavioral Response: CasualAlertEuthymic  Type of Therapy: Individual Therapy  Treatment Goals addressed: Coping  Interventions: CBT, Motivational Interviewing and Solution Focused  Summary: [name] is a 62 y.o. male who presents with continued symptoms of his diagnosis.  Therapist met with Patient in an outpatient setting to assess current mood and assist with making progress towards goals through the use of therapeutic intervention. Therapist did a brief mood check, assessing anger, fear, disgust, excitement, happiness, and sadness.  Patient reports that he has been able to record his triggers and journal this thoughts.  Patient reports struggling with finances.  Patient was able to list financial input and output.  Discussed expectations of the household income and discussed various ways to develop income.    Suicidal/Homicidal: No   Plan: Return again in 2 weeks.  Diagnosis: Axis I: Depression    Axis II: No diagnosis    Lubertha South, LCSW 09/17/2017

## 2017-11-03 ENCOUNTER — Ambulatory Visit: Payer: Self-pay | Admitting: Psychiatry

## 2017-11-03 ENCOUNTER — Ambulatory Visit: Payer: Self-pay | Admitting: Licensed Clinical Social Worker

## 2017-11-12 ENCOUNTER — Ambulatory Visit: Payer: Self-pay | Admitting: Licensed Clinical Social Worker

## 2017-12-03 ENCOUNTER — Ambulatory Visit: Payer: Medicare Other | Admitting: Psychiatry

## 2017-12-04 ENCOUNTER — Encounter: Payer: Self-pay | Admitting: Psychiatry

## 2017-12-04 ENCOUNTER — Ambulatory Visit (INDEPENDENT_AMBULATORY_CARE_PROVIDER_SITE_OTHER): Payer: Medicare Other | Admitting: Psychiatry

## 2017-12-04 VITALS — BP 137/82 | HR 86 | Ht 74.0 in | Wt 268.0 lb

## 2017-12-04 DIAGNOSIS — F410 Panic disorder [episodic paroxysmal anxiety] without agoraphobia: Secondary | ICD-10-CM

## 2017-12-04 DIAGNOSIS — F640 Transsexualism: Secondary | ICD-10-CM | POA: Diagnosis not present

## 2017-12-04 DIAGNOSIS — F4010 Social phobia, unspecified: Secondary | ICD-10-CM | POA: Diagnosis not present

## 2017-12-04 DIAGNOSIS — F330 Major depressive disorder, recurrent, mild: Secondary | ICD-10-CM

## 2017-12-04 MED ORDER — VILAZODONE HCL 20 MG PO TABS: ORAL_TABLET | ORAL | 1 refills | Status: DC

## 2017-12-04 NOTE — Progress Notes (Signed)
BH MD OP Progress Note  12/04/2017 3:33 PM Kyle Hood  MRN:  [id number]  Chief Complaint: ' I am here for follow up." Chief Complaint    Follow-up     HPI: Kyle Hood is a 62 year old transgender male to male, lives in [place], has a history of depression, panic disorder, social anxiety disorder, presented to the clinic today for a follow-up visit.  Patient today reports doing well on the current medication regimen.  Patient continues to take the Viibryd and Seroquel as prescribed.  Patient reports  Viibryd as effective for mood symptoms.  Patient takes Seroquel only as needed.  Patient reports sleep is fair.  Patient reports needing Seroquel at least twice a week.  Patient continues to have a good relationship with patient's partner.  Patient continues to be stressed out about her partner's health problems.  However they were able to go ahead and get a living will made which helps the patient to relax and not be too worried about the future.  Patient continues to work on Systems analystcoping techniques like working with dolls, painting and so on.  Patient continues to be in psychotherapy with Ms. Peacock. Visit Diagnosis:    ICD-10-CM   1. MDD (major depressive disorder), recurrent episode, mild (HCC) F33.0   2. Social anxiety disorder F40.10   3. Panic disorder F41.0   4. 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 F64.0     Past Psychiatric History: Reviewed past psychiatric history from my progress note on 06/05/2017.  Past trials of Zoloft, Wellbutrin.  Past Medical History:  Past Medical History:  Diagnosis Date  . Abnormal liver function test   . Anxiety   . Anxiety   . Arthritis   . Chronic lower back pain   . Degenerative joint disease   . Depression   . Fatty liver   . Heart murmur   . Hepatitis B   . Hyperlipidemia   . Hypertension   . Lumbar polyradiculopathy   . Panic attacks   . Polyneuropathy   . Vertigo 05/03/2017   ER    Past Surgical History:  Procedure Laterality Date  .  COLONOSCOPY    . COLONOSCOPY WITH PROPOFOL N/A 10/01/2017   Procedure: COLONOSCOPY WITH PROPOFOL;  Surgeon: Toledo, [name], MD;  Location: [facility] ENDOSCOPY;  Service: Endoscopy;  Laterality: N/A;  . PROSTATE BIOPSY      Family Psychiatric History: Reviewed family psychiatric history from my progress note on 06/05/2017.  Family History:  Family History  Problem Relation Age of Onset  . Cancer Mother        Pancreatic  . Diabetes Mother   . Hypertension Mother   . Varicose Veins Mother   . Alcohol abuse Mother   . Heart disease Father        CABG  . Cancer Father        Jaw  . Aortic aneurysm Father   . Congestive Heart Failure Father   . Hypertension Father   . Varicose Veins Father   . Alcohol abuse Father   . Bipolar disorder Sister   . Bipolar disorder Brother     Social History: Reviewed social history from my progress note on 06/05/2017. Social History   Socioeconomic History  . Marital status: Significant Other    Spouse name: denise  . Number of children: 0  . Years of education: Not on file  . Highest education level: 11th grade  Occupational History    Comment: disabled  Social Needs  . Financial resource strain:  Very hard  . Food insecurity:    Worry: Never true    Inability: Never true  . Transportation needs:    Medical: No    Non-medical: No  Tobacco Use  . Smoking status: Never Smoker  . Smokeless tobacco: Never Used  Substance and Sexual Activity  . Alcohol use: No    Frequency: Never  . Drug use: No  . Sexual activity: Not Currently  Lifestyle  . Physical activity:    Days per week: 0 days    Minutes per session: 0 min  . Stress: Not on file  Relationships  . Social connections:    Talks on phone: Twice a week    Gets together: Never    Attends religious service: Never    Active member of club or organization: No    Attends meetings of clubs or organizations: Never    Relationship status: Living with partner  Other Topics Concern  . Not  on file  Social History Narrative  . Not on file    Allergies:  Allergies  Allergen Reactions  . Lisinopril Cough    Metabolic Disorder Labs: Lab Results  Component Value Date   HGBA1C 6.1 08/16/2014   No results found for: PROLACTIN Lab Results  Component Value Date   CHOL 145 08/16/2014   TRIG 320 (A) 08/16/2014   HDL 36 08/16/2014   LDLCALC 45 08/16/2014   Lab Results  Component Value Date   TSH 1.61 08/16/2014    Therapeutic Level Labs: No results found for: LITHIUM No results found for: VALPROATE No components found for:  CBMZ  Current Medications: Current Outpatient Medications  Medication Sig Dispense Refill  . hydrochlorothiazide (HYDRODIURIL) 25 MG tablet Take 25 mg by mouth daily.    . meclizine (ANTIVERT) 25 MG tablet Take 1 tablet (25 mg total) by mouth 3 (three) times daily as needed for dizziness. 20 tablet 0  . naproxen (NAPROSYN) 500 MG tablet Take 500 mg by mouth 2 (two) times daily with a meal.    . ondansetron (ZOFRAN ODT) 4 MG disintegrating tablet Take 1 tablet (4 mg total) by mouth every 8 (eight) hours as needed for nausea or vomiting. 20 tablet 0  . pravastatin (PRAVACHOL) 10 MG tablet Take 10 mg by mouth daily.    . QUEtiapine (SEROQUEL) 25 MG tablet TAKE 1 TABLET(25 MG) BY MOUTH AT BEDTIME FOR MOOD OR SLEEP 90 tablet 2  . Vilazodone HCl (VIIBRYD) 20 MG TABS TAKE 1 TABLET(20 MG) BY MOUTH DAILY 90 tablet 1   No current facility-administered medications for this visit.      Musculoskeletal: Strength & Muscle Tone: within normal limits Gait & Station: normal Patient leans: N/A  Psychiatric Specialty Exam: Review of Systems  Psychiatric/Behavioral: Negative for depression. The patient is not nervous/anxious.   All other systems reviewed and are negative.   Blood pressure 137/82, pulse 86, height 6\' 2"  (1.88 m), weight 268 lb (121.6 kg), SpO2 94 %.Body mass index is 34.41 kg/m.  General Appearance: Casual  Eye Contact:  Fair  Speech:   Clear and Coherent  Volume:  Normal  Mood:  Euthymic  Affect:  Congruent  Thought Process:  Goal Directed and Descriptions of Associations: Intact  Orientation:  Full (Time, Place, and Person)  Thought Content: Logical   Suicidal Thoughts:  No  Homicidal Thoughts:  No  Memory:  Immediate;   Fair Recent;   Fair Remote;   Fair  Judgement:  Fair  Insight:  Fair  Psychomotor  Activity:  Normal  Concentration:  Concentration: Fair and Attention Span: Fair  Recall:  [employer] of Knowledge: Fair  Language: Fair  Akathisia:  No  Handed:  Right  AIMS (if indicated): denies rigidity,stiffness  Assets:  Communication Skills Desire for Improvement Social Support  ADL's:  Intact  Cognition: WNL  Sleep:  improving   Screenings: PHQ2-9     Office Visit from 07/03/2017 in [facility] Procedure visit from 06/11/2017 in [facility] Office Visit from 04[facility]03/2017 in [facility] Procedure visit from 03[facility]05/2017 in [facility] Office Visit from 02/13/2017 in [facility]  PHQ-2 Total Score  0  0  0  0  0       Assessment and Plan: [place] is a 62 year old male to male transgender with history of depression, anxiety, presented to the clinic today for a follow-up visit.  Patient today reports medications as helpful.  Patient to continue psychotherapy.  Plan For depression Continue Viibryd 20 mg p.o. daily Continue Seroquel 25 mg p.o. nightly as needed  For anxiety symptoms Viibryd 20 mg p.o. daily Continue CBT  For insomnia Seroquel as needed.  Follow-up in clinic in 3 months  More than 50 % of the time was spent for psychoeducation and supportive psychotherapy and care coordination.  This note was generated in part or whole with voice recognition software. Voice recognition is  usually quite accurate but there are transcription errors that can and very often do occur. I apologize for any typographical errors that were not detected and corrected.       Jomarie Longs, MD 12/05/2017, 8:30 AM

## 2017-12-05 ENCOUNTER — Encounter: Payer: Self-pay | Admitting: Psychiatry

## 2017-12-10 IMAGING — US US EXTREM LOW VENOUS*L*
1 series · 13 of 24 positions shown · non-contrast
Comparison: June 14, 2016

CLINICAL DATA: Left lower extremity edema

EXAM:
LEFT LOWER EXTREMITY VENOUS DUPLEX ULTRASOUND
TECHNIQUE: Gray-scale sonography with graded compression, as well as color
Doppler and duplex ultrasound were performed to evaluate the left
lower extremity deep venous system from the level of the common
femoral vein and including the common femoral, femoral, profunda
femoral, popliteal and calf veins including the posterior tibial,
peroneal and gastrocnemius veins when visible. The superficial great
saphenous vein was also interrogated. Spectral Doppler was utilized
to evaluate flow at rest and with distal augmentation maneuvers in
the common femoral, femoral and popliteal veins.

[Series 1: us extrem low venous*left* · 0.10mm/px · 13 of 34 slices shown]
[im 1/34]
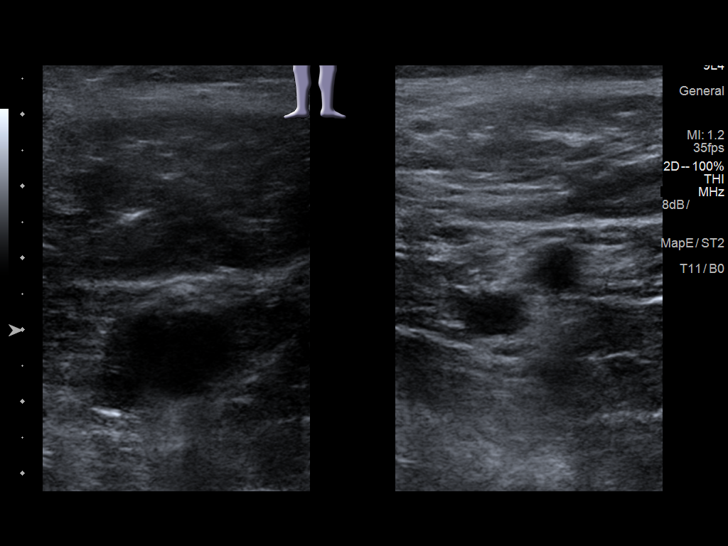
[im 3/34]
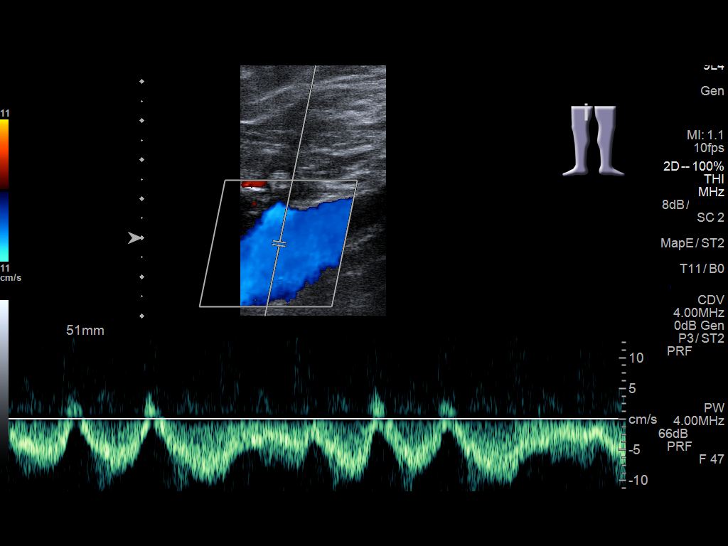
[im 6/34]
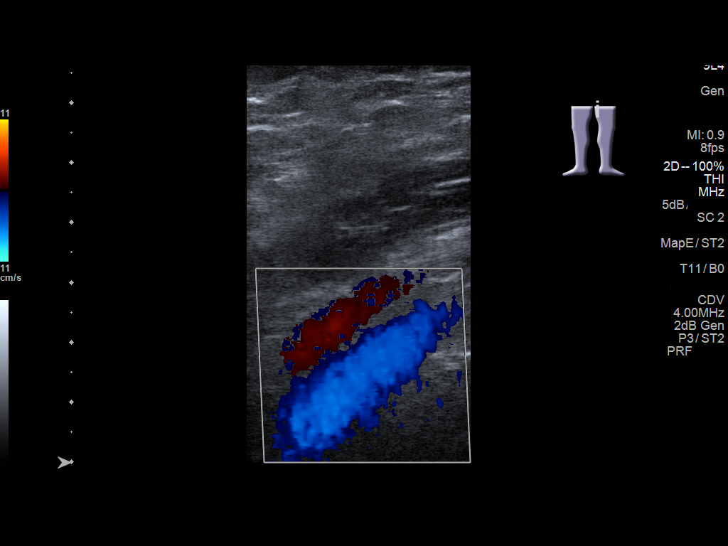
[im 9/34]
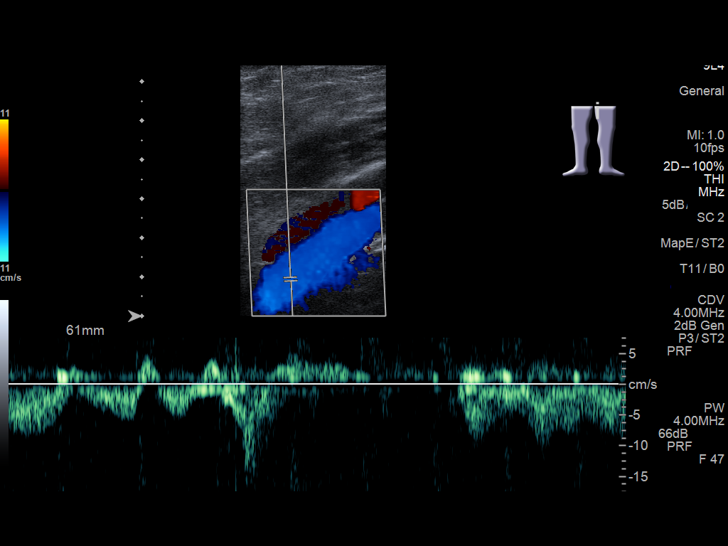
[im 12/34]
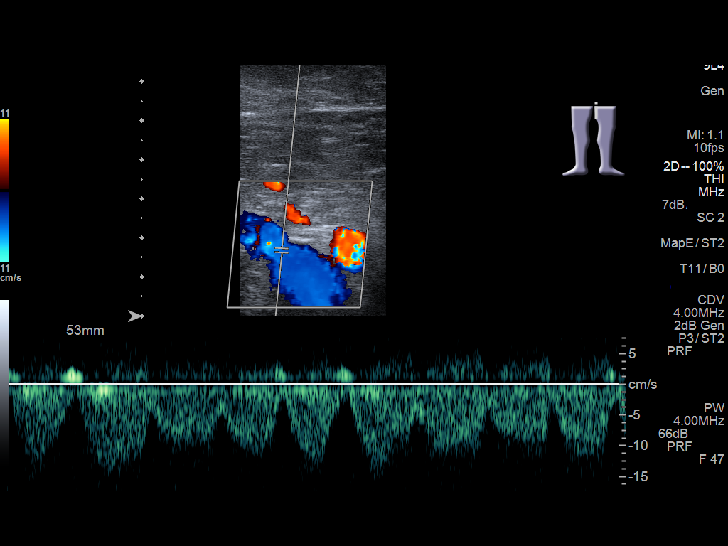
[im 15/34]
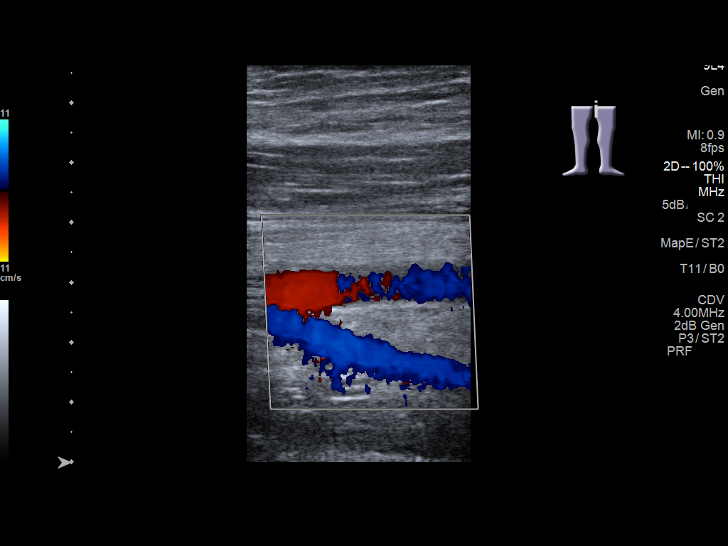
[im 18/34]
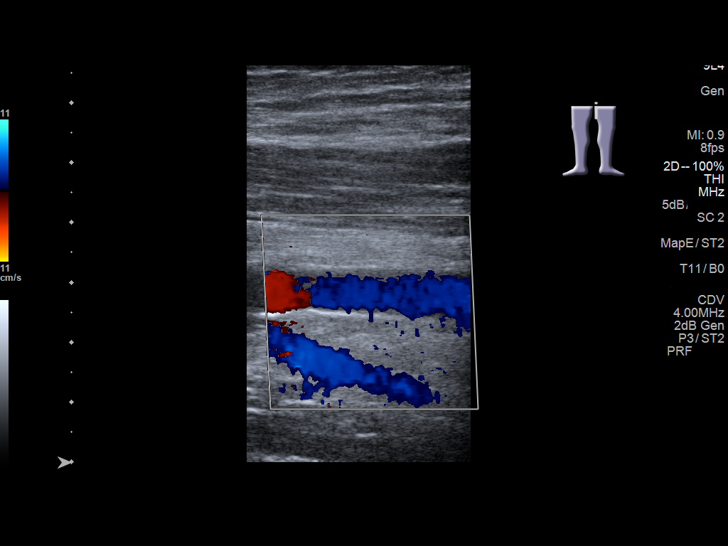
[im 19/34]
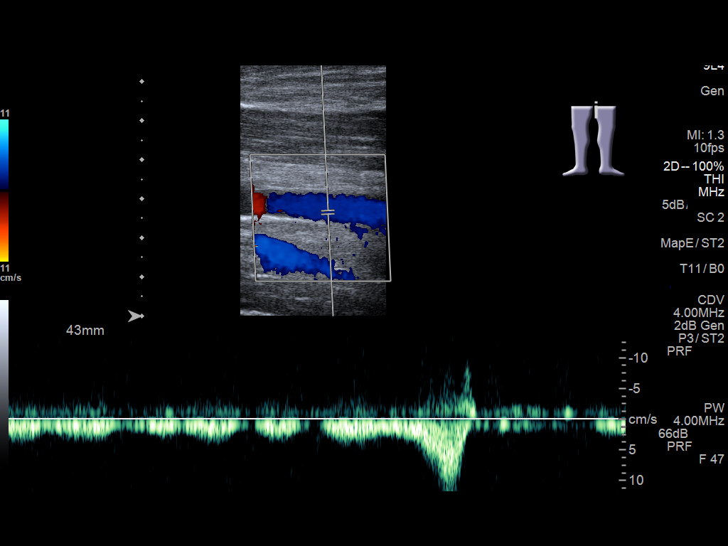
[im 22/34]
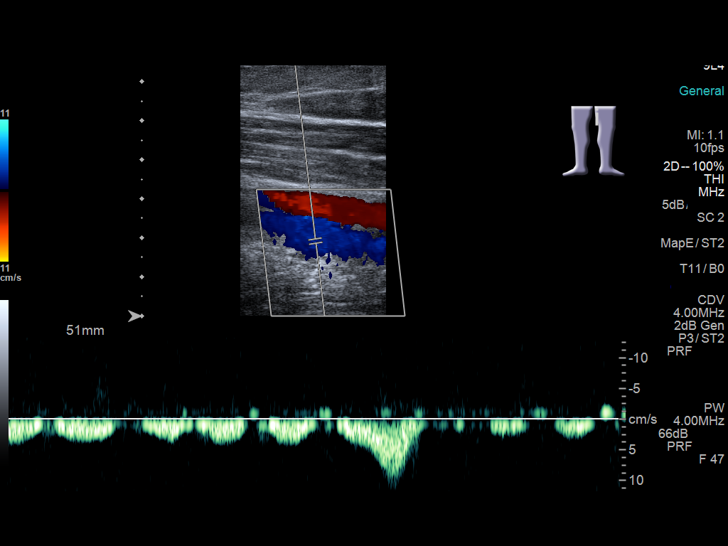
[im 25/34]
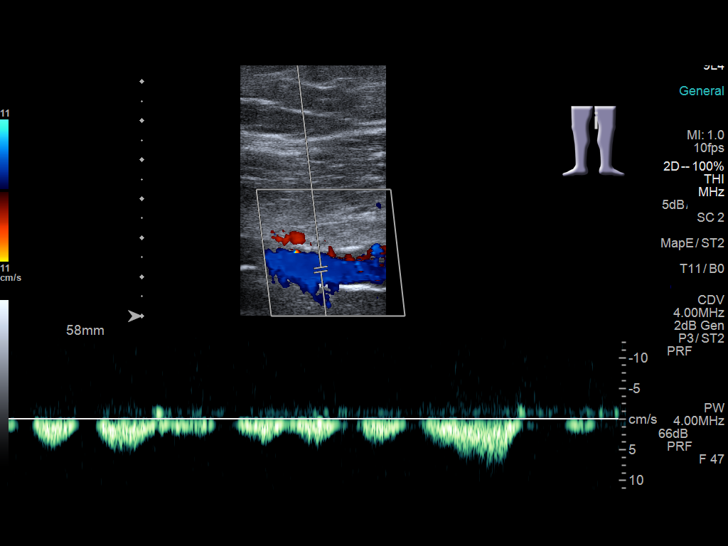
[im 28/34]
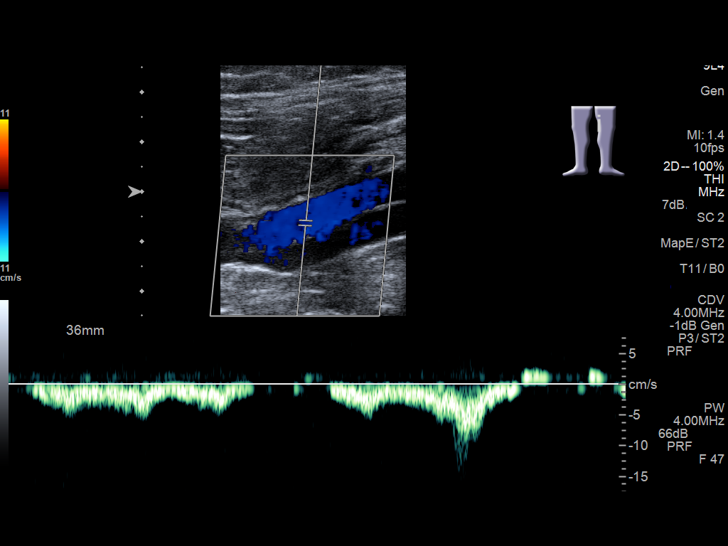
[im 31/34]
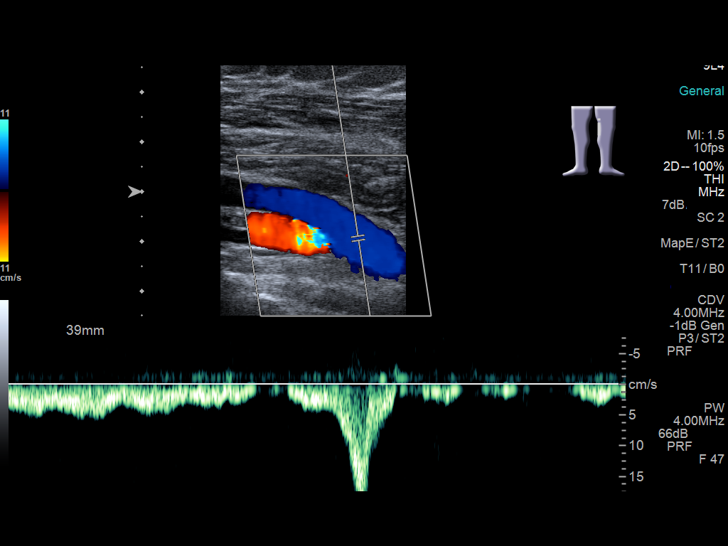
[im 34/34]
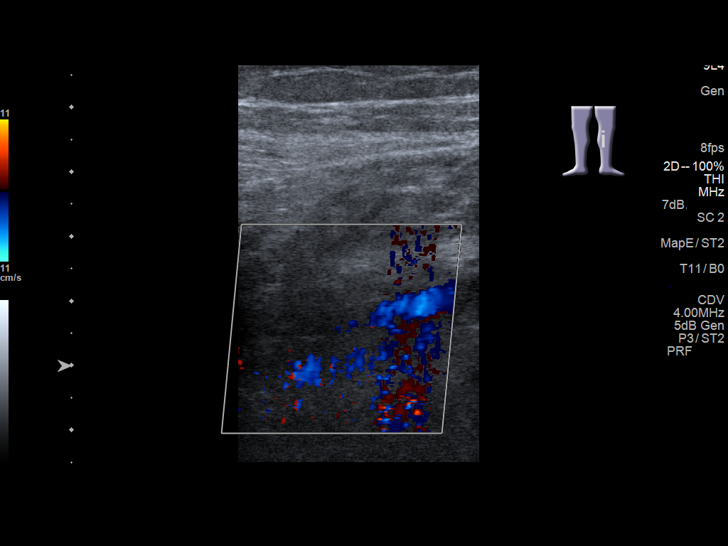

[13 of 24 positions shown; findings below may reference images not displayed]

FINDINGS: Contralateral Common Femoral Vein: Respiratory phasicity is normal
and symmetric with the symptomatic side. No evidence of thrombus.
Normal compressibility.

Common Femoral Vein: No evidence of thrombus. Normal
compressibility, respiratory phasicity and response to augmentation.

Saphenofemoral Junction: No evidence of thrombus. Normal
compressibility and flow on color Doppler imaging.

Profunda Femoral Vein: No evidence of thrombus. Normal
compressibility and flow on color Doppler imaging.

Femoral Vein: No evidence of thrombus. Normal compressibility,
respiratory phasicity and response to augmentation.

Popliteal Vein: No evidence of thrombus. Normal compressibility,
respiratory phasicity and response to augmentation.

Calf Veins: No evidence of thrombus. Normal compressibility and flow
on color Doppler imaging.

Superficial Great Saphenous Vein: No evidence of thrombus. Normal
compressibility.

Venous Reflux:  None.

Other Findings:  None.
IMPRESSION: No evidence of deep venous thrombosis in the left lower extremity.
Right common femoral vein also patent.

## 2017-12-17 ENCOUNTER — Ambulatory Visit (INDEPENDENT_AMBULATORY_CARE_PROVIDER_SITE_OTHER): Payer: Medicare Other | Admitting: Licensed Clinical Social Worker

## 2017-12-17 DIAGNOSIS — F330 Major depressive disorder, recurrent, mild: Secondary | ICD-10-CM | POA: Diagnosis not present

## 2018-01-08 ENCOUNTER — Ambulatory Visit
Payer: Medicare Other | Attending: Student in an Organized Health Care Education/Training Program | Admitting: Student in an Organized Health Care Education/Training Program

## 2018-01-08 ENCOUNTER — Encounter: Payer: Self-pay | Admitting: Student in an Organized Health Care Education/Training Program

## 2018-01-08 ENCOUNTER — Other Ambulatory Visit: Payer: Self-pay

## 2018-01-08 VITALS — BP 165/87 | HR 86 | Temp 98.2°F | Resp 16 | Ht 72.0 in | Wt 260.0 lb

## 2018-01-08 DIAGNOSIS — M1288 Other specific arthropathies, not elsewhere classified, other specified site: Secondary | ICD-10-CM | POA: Insufficient documentation

## 2018-01-08 DIAGNOSIS — Z888 Allergy status to other drugs, medicaments and biological substances status: Secondary | ICD-10-CM | POA: Diagnosis not present

## 2018-01-08 DIAGNOSIS — E785 Hyperlipidemia, unspecified: Secondary | ICD-10-CM | POA: Insufficient documentation

## 2018-01-08 DIAGNOSIS — Z8249 Family history of ischemic heart disease and other diseases of the circulatory system: Secondary | ICD-10-CM | POA: Insufficient documentation

## 2018-01-08 DIAGNOSIS — G629 Polyneuropathy, unspecified: Secondary | ICD-10-CM | POA: Insufficient documentation

## 2018-01-08 DIAGNOSIS — B1910 Unspecified viral hepatitis B without hepatic coma: Secondary | ICD-10-CM | POA: Diagnosis not present

## 2018-01-08 DIAGNOSIS — I10 Essential (primary) hypertension: Secondary | ICD-10-CM | POA: Diagnosis not present

## 2018-01-08 DIAGNOSIS — M545 Low back pain: Secondary | ICD-10-CM | POA: Diagnosis not present

## 2018-01-08 DIAGNOSIS — F329 Major depressive disorder, single episode, unspecified: Secondary | ICD-10-CM | POA: Insufficient documentation

## 2018-01-08 DIAGNOSIS — Z79899 Other long term (current) drug therapy: Secondary | ICD-10-CM | POA: Insufficient documentation

## 2018-01-08 DIAGNOSIS — F411 Generalized anxiety disorder: Secondary | ICD-10-CM | POA: Insufficient documentation

## 2018-01-08 DIAGNOSIS — F410 Panic disorder [episodic paroxysmal anxiety] without agoraphobia: Secondary | ICD-10-CM | POA: Insufficient documentation

## 2018-01-08 DIAGNOSIS — M150 Primary generalized (osteo)arthritis: Secondary | ICD-10-CM

## 2018-01-08 DIAGNOSIS — Z818 Family history of other mental and behavioral disorders: Secondary | ICD-10-CM | POA: Insufficient documentation

## 2018-01-08 DIAGNOSIS — M47816 Spondylosis without myelopathy or radiculopathy, lumbar region: Secondary | ICD-10-CM

## 2018-01-08 DIAGNOSIS — K760 Fatty (change of) liver, not elsewhere classified: Secondary | ICD-10-CM | POA: Insufficient documentation

## 2018-01-08 DIAGNOSIS — M159 Polyosteoarthritis, unspecified: Secondary | ICD-10-CM

## 2018-01-08 DIAGNOSIS — R600 Localized edema: Secondary | ICD-10-CM | POA: Diagnosis not present

## 2018-01-08 DIAGNOSIS — M5136 Other intervertebral disc degeneration, lumbar region: Secondary | ICD-10-CM | POA: Diagnosis not present

## 2018-01-08 DIAGNOSIS — G8929 Other chronic pain: Secondary | ICD-10-CM | POA: Insufficient documentation

## 2018-01-08 MED ORDER — NAPROXEN 500 MG PO TABS: 500.0000 mg | ORAL_TABLET | Freq: Two times a day (BID) | ORAL | 3 refills | Status: DC

## 2018-01-08 NOTE — Progress Notes (Signed)
Safety precautions to be maintained throughout the outpatient stay will include: orient to surroundings, keep bed in low position, maintain call bell within reach at all times, provide assistance with transfer out of bed and ambulation.  

## 2018-01-08 NOTE — Progress Notes (Signed)
Patient's Name: Jessejames Shimp  MRN: [id number]  Referring Provider: Orene Desanctis, MD  DOB: 27-Jul-1955  PCP: Orene Desanctis, MD  DOS: 01/08/2018  Note by: Edward Jolly, MD  Service setting: Ambulatory outpatient  Specialty: Interventional Pain Management  Location: [facility] (AMB) Pain Management Facility    Patient type: Established   Primary Reason(s) for Visit: Evaluation of chronic illnesses with exacerbation, or progression (Level of risk: moderate) CC: Back Pain (lower)  HPI  Mr. Maute is a 62 y.o. year old, male patient, who comes today for a follow-up evaluation. He has Hypertension; Lower extremity edema; Hyperlipidemia; Transient loss of consciousness; Cellulitis; Pain in limb; DJD (degenerative joint disease); Anxiety, generalized; Chronic bilateral low back pain; Lumbar polyradiculopathy; and Polyneuropathy on their problem list. Mr. Westerfeld was last seen on 08/21/2017. His primarily concern today is the Back Pain (lower)  Pain Assessment: Location: Lower Back Radiating: down backs of both legs to the bottoms of feet Onset: More than a month ago Duration: Chronic pain Quality: Shooting, Constant, Dull, Throbbing Severity: 7 /10 (subjective, self-reported pain score)  Note: Reported level is compatible with observation.                         When using our objective Pain Scale, levels between 6 and 10/10 are said to belong in an emergency room, as it progressively worsens from a 6/10, described as severely limiting, requiring emergency care not usually available at an outpatient pain management facility. At a 6/10 level, communication becomes difficult and requires great effort. Assistance to reach the emergency department may be required. Facial flushing and profuse sweating along with potentially dangerous increases in heart rate and blood pressure will be evident. Effect on ADL: "I can't walk alot" Timing: Constant Modifying factors: meds BP: (!) 165/87  HR: 86  Further  details on both, my assessment(s), as well as the proposed treatment plan, please see below.  Patient follows up complaining of low back pain that is worse on the left side.  He describes it as pain that is similar in quality to prior to his ablation.  Patient is status post right L3-L4-L5 lumbar RFA on 06/11/2017.  For some reason, he never had his left side performed.  This could be 1 of the reasons why is continued to have axial low back pain as he states that his right side is somewhat better.  We discussed following through with left L3, L4, L5 lumbar radio frequency ablation.  Patient is status post 2 diagnostic left L3, L4, L5 lumbar facet medial branch nerve blocks on 03/05/2017 and 01/29/2017 which provided him with greater than 50% pain relief for approximately 2 to 3 weeks and improvement in range of motion.  Laboratory Chemistry  Inflammation Markers (CRP: Acute Phase) (ESR: Chronic Phase) Lab Results  Component Value Date   LATICACIDVEN 1.5 12/30/2015                         Rheumatology Markers No results found for: RF, ANA, LABURIC, URICUR, LYMEIGGIGMAB, LYMEABIGMQN, HLAB27                      Renal Function Markers Lab Results  Component Value Date   BUN 17 05/03/2017   CREATININE 0.94 05/03/2017   GFRAA >60 05/03/2017   GFRNONAA >60 05/03/2017  Hepatic Function Markers Lab Results  Component Value Date   AST 35 05/03/2017   ALT 30 05/03/2017   ALBUMIN 4.3 05/03/2017   ALKPHOS 65 05/03/2017   LIPASE 30 05/03/2017                        Electrolytes Lab Results  Component Value Date   NA 137 05/03/2017   K 3.2 (L) 05/03/2017   CL 102 05/03/2017   CALCIUM 8.8 (L) 05/03/2017                        Neuropathy Markers Lab Results  Component Value Date   HGBA1C 6.1 08/16/2014   HIV Non Reactive 06/18/2016                        CNS Tests Lab Results  Component Value Date   SDES BLOOD RIGHT HAND 06/18/2016   SDES BLOOD LEFT AC  06/18/2016   CULT NO GROWTH 5 DAYS 06/18/2016   CULT NO GROWTH 5 DAYS 06/18/2016                        Bone Pathology Markers No results found for: VD25OH, [id number], LO7564PP2, [id number], 25OHVITD1, 25OHVITD2, 25OHVITD3, TESTOFREE, TESTOSTERONE                       Coagulation Parameters Lab Results  Component Value Date   INR 0.96 12/30/2015   LABPROT 12.8 12/30/2015   PLT 258 05/03/2017                        Cardiovascular Markers Lab Results  Component Value Date   TROPONINI <0.03 12/30/2015   HGB 15.1 05/03/2017   HCT 43.2 05/03/2017                         CA Markers No results found for: CEA, CA125, LABCA2                      Note: Lab results reviewed.  Recent Diagnostic Imaging Review    Lumbosacral Imaging: Lumbar MR wo contrast:  Results for orders placed during the hospital encounter of 09/05/16  MR LUMBAR SPINE WO CONTRAST   Narrative CLINICAL DATA:  Low back and BILATERAL leg pain. Weakness after standing or walking.  EXAM: MRI LUMBAR SPINE WITHOUT CONTRAST  TECHNIQUE: Multiplanar, multisequence MR imaging of the lumbar spine was performed. No intravenous contrast was administered.  COMPARISON:  None.  FINDINGS: Segmentation:  Standard  Alignment:  Physiologic.  Vertebrae: No fracture, evidence of discitis, or aggressive bone lesion. Hemangioma T12.  Conus medullaris: Extends to the L1 level and appears normal.  Paraspinal and other soft tissues: Negative.  Disc levels:  L1-L2:  Normal.  L2-L3:  Normal.  L3-L4:  Annular bulge.  Facet arthropathy.  No impingement.  L4-L5: Facet arthropathy. Far lateral, foraminal, and extraforaminal protrusion on the LEFT. No subarticular zone narrowing. Annular tear could irritate the LEFT L4 nerve root but there is not significant foraminal narrowing. The nerve could be displaced in the extraforaminal space.  L5-S1:  Annular bulge.  Facet arthropathy.  No impingement.  IMPRESSION: There  is no spinal stenosis or large disc extrusion contributing to BILATERAL leg pain or lower extremity numbness.  Far-lateral, foraminal, and extraforaminal protrusion on the LEFT. LEFT  L4 nerve root irritation/impingement is possible.   Electronically Signed   By: Elsie Stain M.D.   On: 09/05/2016 14:03      Complexity Note: Imaging results reviewed. Results shared with Mr. Dieckhoff, using Layman's terms.                         Meds   Current Outpatient Medications:  .  diclofenac sodium (VOLTAREN) 1 % GEL, Apply topically 4 (four) times daily., Disp: , Rfl:  .  hydrochlorothiazide (HYDRODIURIL) 25 MG tablet, Take 25 mg by mouth daily., Disp: , Rfl:  .  meclizine (ANTIVERT) 25 MG tablet, Take 1 tablet (25 mg total) by mouth 3 (three) times daily as needed for dizziness., Disp: 20 tablet, Rfl: 0 .  naproxen (NAPROSYN) 500 MG tablet, Take 1 tablet (500 mg total) by mouth 2 (two) times daily with a meal., Disp: 60 tablet, Rfl: 3 .  ondansetron (ZOFRAN ODT) 4 MG disintegrating tablet, Take 1 tablet (4 mg total) by mouth every 8 (eight) hours as needed for nausea or vomiting., Disp: 20 tablet, Rfl: 0 .  pravastatin (PRAVACHOL) 10 MG tablet, Take 10 mg by mouth daily., Disp: , Rfl:  .  QUEtiapine (SEROQUEL) 25 MG tablet, TAKE 1 TABLET(25 MG) BY MOUTH AT BEDTIME FOR MOOD OR SLEEP, Disp: 90 tablet, Rfl: 2 .  Vilazodone HCl (VIIBRYD) 20 MG TABS, TAKE 1 TABLET(20 MG) BY MOUTH DAILY, Disp: 90 tablet, Rfl: 1  ROS  Constitutional: Denies any fever or chills Gastrointestinal: No reported hemesis, hematochezia, vomiting, or acute GI distress Musculoskeletal: Denies any acute onset joint swelling, redness, loss of ROM, or weakness Neurological: No reported episodes of acute onset apraxia, aphasia, dysarthria, agnosia, amnesia, paralysis, loss of coordination, or loss of consciousness  Allergies  Mr. Dysart is allergic to lisinopril.  PFSH  Drug: Mr. Braff  reports that he does not use  drugs. Alcohol:  reports that he does not drink alcohol. Tobacco:  reports that he has never smoked. He has never used smokeless tobacco. Medical:  has a past medical history of Abnormal liver function test, Anxiety, Anxiety, Arthritis, Chronic lower back pain, Degenerative joint disease, Depression, Fatty liver, Heart murmur, Hepatitis B, Hyperlipidemia, Hypertension, Lumbar polyradiculopathy, Panic attacks, Polyneuropathy, and Vertigo (05/03/2017). Surgical: Mr. Bonsell  has a past surgical history that includes Prostate biopsy; Colonoscopy; and Colonoscopy with propofol (N/A, 10/01/2017). Family: family history includes Alcohol abuse in his father and mother; Aortic aneurysm in his father; Bipolar disorder in his brother and sister; Cancer in his father and mother; Congestive Heart Failure in his father; Diabetes in his mother; Heart disease in his father; Hypertension in his father and mother; Varicose Veins in his father and mother.  Constitutional Exam  General appearance: Well nourished, well developed, and well hydrated. In no apparent acute distress Vitals:   01/08/18 1029  BP: (!) 165/87  Pulse: 86  Resp: 16  Temp: 98.2 F (36.8 C)  TempSrc: Oral  SpO2: 98%  Weight: 260 lb (117.9 kg)  Height: 6' (1.829 m)   BMI Assessment: Estimated body mass index is 35.26 kg/m as calculated from the following:   Height as of this encounter: 6' (1.829 m).   Weight as of this encounter: 260 lb (117.9 kg).  BMI interpretation table: BMI level Category Range association with higher incidence of chronic pain  <18 kg/m2 Underweight   18.5-24.9 kg/m2 Ideal body weight   25-29.9 kg/m2 Overweight Increased incidence by 20%  30-34.9 kg/m2  Obese (Class I) Increased incidence by 68%  35-39.9 kg/m2 Severe obesity (Class II) Increased incidence by 136%  >40 kg/m2 Extreme obesity (Class III) Increased incidence by 254%   Patient's current BMI Ideal Body weight  Body mass index is 35.26 kg/m. Ideal  body weight: 77.6 kg (171 lb 1.2 oz) Adjusted ideal body weight: 93.7 kg (206 lb 10.3 oz)   BMI Readings from Last 4 Encounters:  01/08/18 35.26 kg/m  10/01/17 33.38 kg/m  07/03/17 33.90 kg/m  06/11/17 34.02 kg/m   Wt Readings from Last 4 Encounters:  01/08/18 260 lb (117.9 kg)  10/01/17 260 lb (117.9 kg)  07/03/17 264 lb (119.7 kg)  06/11/17 265 lb (120.2 kg)  Psych/Mental status: Alert, oriented x 3 (person, place, & time)       Eyes: PERLA Respiratory: No evidence of acute respiratory distress  Cervical Spine Area Exam  Skin & Axial Inspection: No masses, redness, edema, swelling, or associated skin lesions Alignment: Symmetrical Functional ROM: Unrestricted ROM      Stability: No instability detected Muscle Tone/Strength: Functionally intact. No obvious neuro-muscular anomalies detected. Sensory (Neurological): Unimpaired Palpation: No palpable anomalies              Upper Extremity (UE) Exam    Side: Right upper extremity  Side: Left upper extremity  Skin & Extremity Inspection: Skin color, temperature, and hair growth are WNL. No peripheral edema or cyanosis. No masses, redness, swelling, asymmetry, or associated skin lesions. No contractures.  Skin & Extremity Inspection: Skin color, temperature, and hair growth are WNL. No peripheral edema or cyanosis. No masses, redness, swelling, asymmetry, or associated skin lesions. No contractures.  Functional ROM: Unrestricted ROM          Functional ROM: Unrestricted ROM          Muscle Tone/Strength: Functionally intact. No obvious neuro-muscular anomalies detected.  Muscle Tone/Strength: Functionally intact. No obvious neuro-muscular anomalies detected.  Sensory (Neurological): Unimpaired          Sensory (Neurological): Unimpaired          Palpation: No palpable anomalies              Palpation: No palpable anomalies              Provocative Test(s):  Phalen's test: deferred Tinel's test: deferred Apley's scratch test (touch  opposite shoulder):  Action 1 (Across chest): deferred Action 2 (Overhead): deferred Action 3 (LB reach): deferred   Provocative Test(s):  Phalen's test: deferred Tinel's test: deferred Apley's scratch test (touch opposite shoulder):  Action 1 (Across chest): deferred Action 2 (Overhead): deferred Action 3 (LB reach): deferred    Thoracic Spine Area Exam  Skin & Axial Inspection: No masses, redness, or swelling Alignment: Symmetrical Functional ROM: Unrestricted ROM Stability: No instability detected Muscle Tone/Strength: Functionally intact. No obvious neuro-muscular anomalies detected. Sensory (Neurological): Unimpaired Muscle strength & Tone: No palpable anomalies  Lumbar Spine Area Exam  Skin & Axial Inspection: No masses, redness, or swelling Alignment: Symmetrical Functional ROM: Decreased ROM affecting primarily the left Stability: No instability detected Muscle Tone/Strength: Functionally intact. No obvious neuro-muscular anomalies detected. Sensory (Neurological): Musculoskeletal pain pattern Palpation: No palpable anomalies       Provocative Tests: Hyperextension/rotation test: (+) bilaterally for facet joint pain. , L>>R Lumbar quadrant test (Kemp's test): deferred today       Lateral bending test: deferred today       Patrick's Maneuver: deferred today  FABER test: deferred today                   S-I anterior distraction/compression test: deferred today         S-I lateral compression test: deferred today         S-I Thigh-thrust test: deferred today         S-I Gaenslen's test: deferred today          Gait & Posture Assessment  Ambulation: Unassisted Gait: Relatively normal for age and body habitus Posture: WNL   Lower Extremity Exam    Side: Right lower extremity  Side: Left lower extremity  Stability: No instability observed          Stability: No instability observed          Skin & Extremity Inspection: Skin color, temperature, and  hair growth are WNL. No peripheral edema or cyanosis. No masses, redness, swelling, asymmetry, or associated skin lesions. No contractures.  Skin & Extremity Inspection: Skin color, temperature, and hair growth are WNL. No peripheral edema or cyanosis. No masses, redness, swelling, asymmetry, or associated skin lesions. No contractures.  Functional ROM: Unrestricted ROM                  Functional ROM: Unrestricted ROM                  Muscle Tone/Strength: Functionally intact. No obvious neuro-muscular anomalies detected.  Muscle Tone/Strength: Functionally intact. No obvious neuro-muscular anomalies detected.  Sensory (Neurological): Unimpaired  Sensory (Neurological): Unimpaired  Palpation: No palpable anomalies  Palpation: No palpable anomalies   Assessment  Primary Diagnosis & Pertinent Problem List: The primary encounter diagnosis was Lumbar spondylosis. Diagnoses of Lumbar degenerative disc disease, Primary osteoarthritis involving multiple joints, and Lumbar facet arthropathy were also pertinent to this visit.  Status Diagnosis  Having a Flare-up Persistent Persistent 1. Lumbar spondylosis   2. Lumbar degenerative disc disease   3. Primary osteoarthritis involving multiple joints   4. Lumbar facet arthropathy      General Recommendations: The pain condition that the patient suffers from is best treated with a multidisciplinary approach that involves an increase in physical activity to prevent de-conditioning and worsening of the pain cycle, as well as psychological counseling (formal and/or informal) to address the co-morbid psychological affects of pain. Treatment will often involve judicious use of pain medications and interventional procedures to decrease the pain, allowing the patient to participate in the physical activity that will ultimately produce long-lasting pain reductions. The goal of the multidisciplinary approach is to return the patient to a higher level of overall  function and to restore their ability to perform activities of daily living.  Patient follows up complaining of low back pain that is worse on the left side.  He describes it as pain that is similar in quality to prior to his ablation.  Patient is status post right L3-L4-L5 lumbar RFA on 06/11/2017.  For some reason, he never had his left side performed.  This could be 1 of the reasons why is continued to have axial low back pain as he states that his right side is somewhat better.  We discussed following through with left L3, L4, L5 lumbar radio frequency ablation.  Patient is status post 2 diagnostic left L3, L4, L5 lumbar facet medial branch nerve blocks on 03/05/2017 and 01/29/2017 which provided him with greater than 50% pain relief for approximately 2 to 3 weeks and improvement in range of motion.  We will schedule patient for LEFT L3, 4, 5 facet RFA. Refill of Naproxen as below. Risk and benefits of RFA done and pt would like to proceed.  Plan of Care  Pharmacotherapy (Medications Ordered): Meds ordered this encounter  Medications  . naproxen (NAPROSYN) 500 MG tablet    Sig: Take 1 tablet (500 mg total) by mouth 2 (two) times daily with a meal.    Dispense:  60 tablet    Refill:  3   Lab-work, procedure(s), and/or referral(s): Orders Placed This Encounter  Procedures  . Radiofrequency,Lumbar   Time Note: Greater than 50% of the 25 minute(s) of face-to-face time spent with Mr. Petrella, was spent in counseling/coordination of care regarding: Mr. Samaan's primary cause of pain, the treatment plan, treatment alternatives, the risks and possible complications of proposed treatment, medication side effects, the appropriate use of his medications, realistic expectations and the goals of pain management (increased in functionality).  Provider-requested follow-up: Return in about 3 weeks (around 01/29/2018) for Procedure.  Future Appointments  Date Time Provider Department Center  01/21/2018   1:00 PM Marinda Elk, [place] [facility] None  02/11/2018  2:00 PM Jomarie Longs, [place] [facility] None    Primary Care Physician: Orene Desanctis, MD Location: [facility] Note by: Edward Jolly, M.D Date: 01/08/2018; Time: 10:55 AM  There are no Patient Instructions on file for this visit.

## 2018-01-08 NOTE — Patient Instructions (Signed)
Moderate Conscious Sedation, Adult Sedation is the use of medicines to promote relaxation and relieve discomfort and anxiety. Moderate conscious sedation is a type of sedation. Under moderate conscious sedation, you are less alert than normal, but you are still able to respond to instructions, touch, or both. Moderate conscious sedation is used during short medical and dental procedures. It is milder than deep sedation, which is a type of sedation under which you cannot be easily woken up. It is also milder than general anesthesia, which is the use of medicines to make you unconscious. Moderate conscious sedation allows you to return to your regular activities sooner. Tell a health care provider about:  Any allergies you have.  All medicines you are taking, including vitamins, herbs, eye drops, creams, and over-the-counter medicines.  Use of steroids (by mouth or creams).  Any problems you or family members have had with sedatives and anesthetic medicines.  Any blood disorders you have.  Any surgeries you have had.  Any medical conditions you have, such as sleep apnea.  Whether you are pregnant or may be pregnant.  Any use of cigarettes, alcohol, marijuana, or street drugs. What are the risks? Generally, this is a safe procedure. However, problems may occur, including:  Getting too much medicine (oversedation).  Nausea.  Allergic reaction to medicines.  Trouble breathing. If this happens, a breathing tube may be used to help with breathing. It will be removed when you are awake and breathing on your own.  Heart trouble.  Lung trouble.  What happens before the procedure? Staying hydrated Follow instructions from your health care provider about hydration, which may include:  Up to 2 hours before the procedure - you may continue to drink clear liquids, such as water, clear fruit juice, black coffee, and plain tea.  Eating and drinking restrictions Follow instructions from  your health care provider about eating and drinking, which may include:  8 hours before the procedure - stop eating heavy meals or foods such as meat, fried foods, or fatty foods.  6 hours before the procedure - stop eating light meals or foods, such as toast or cereal.  6 hours before the procedure - stop drinking milk or drinks that contain milk.  2 hours before the procedure - stop drinking clear liquids.  Medicine  Ask your health care provider about:  Changing or stopping your regular medicines. This is especially important if you are taking diabetes medicines or blood thinners.  Taking medicines such as aspirin and ibuprofen. These medicines can thin your blood. Do not take these medicines before your procedure if your health care provider instructs you not to.  Tests and exams  You will have a physical exam.  You may have blood tests done to show: ? How well your kidneys and liver are working. ? How well your blood can clot. General instructions  Plan to have someone take you home from the hospital or clinic.  If you will be going home right after the procedure, plan to have someone with you for 24 hours. What happens during the procedure?  An IV tube will be inserted into one of your veins.  Medicine to help you relax (sedative) will be given through the IV tube.  The medical or dental procedure will be performed. What happens after the procedure?  Your blood pressure, heart rate, breathing rate, and blood oxygen level will be monitored often until the medicines you were given have worn off.  Do not drive for 24 hours.   This information is not intended to replace advice given to you by your health care provider. Make sure you discuss any questions you have with your health care provider. Document Released: 01/08/2001 Document Revised: 09/19/2015 Document Reviewed: 08/05/2015 Elsevier Interactive Patient Education  2018 [employer]   GENERAL RISKS AND  COMPLICATIONS  What are the risk, side effects and possible complications? Generally speaking, most procedures are safe.  However, with any procedure there are risks, side effects, and the possibility of complications.  The risks and complications are dependent upon the sites that are lesioned, or the type of nerve block to be performed.  The closer the procedure is to the spine, the more serious the risks are.  Great care is taken when placing the radio frequency needles, block needles or lesioning probes, but sometimes complications can occur. 1. Infection: Any time there is an injection through the skin, there is a risk of infection.  This is why sterile conditions are used for these blocks.  There are four possible types of infection. 1. Localized skin infection. 2. Central Nervous System Infection-This can be in the form of Meningitis, which can be deadly. 3. Epidural Infections-This can be in the form of an epidural abscess, which can cause pressure inside of the spine, causing compression of the spinal cord with subsequent paralysis. This would require an emergency surgery to decompress, and there are no guarantees that the patient would recover from the paralysis. 4. Discitis-This is an infection of the intervertebral discs.  It occurs in about 1% of discography procedures.  It is difficult to treat and it may lead to surgery.        2. Pain: the needles have to go through skin and soft tissues, will cause soreness.       3. Damage to internal structures:  The nerves to be lesioned may be near blood vessels or    other nerves which can be potentially damaged.       4. Bleeding: Bleeding is more common if the patient is taking blood thinners such as  aspirin, Coumadin, Ticiid, Plavix, etc., or if he/she have some genetic predisposition  such as hemophilia. Bleeding into the spinal canal can cause compression of the spinal  cord with subsequent paralysis.  This would require an emergency surgery  to  decompress and there are no guarantees that the patient would recover from the  paralysis.       5. Pneumothorax:  Puncturing of a lung is a possibility, every time a needle is introduced in  the area of the chest or upper back.  Pneumothorax refers to free air around the  collapsed lung(s), inside of the thoracic cavity (chest cavity).  Another two possible  complications related to a similar event would include: Hemothorax and Chylothorax.   These are variations of the Pneumothorax, where instead of air around the collapsed  lung(s), you may have blood or chyle, respectively.       6. Spinal headaches: They may occur with any procedures in the area of the spine.       7. Persistent CSF (Cerebro-Spinal Fluid) leakage: This is a rare problem, but may occur  with prolonged intrathecal or epidural catheters either due to the formation of a fistulous  track or a dural tear.       8. Nerve damage: By working so close to the spinal cord, there is always a possibility of  nerve damage, which could be as serious as a permanent spinal cord   injury with  paralysis.       9. Death:  Although rare, severe deadly allergic reactions known as "Anaphylactic  reaction" can occur to any of the medications used.      10. Worsening of the symptoms:  We can always make thing worse.  What are the chances of something like this happening? Chances of any of this occuring are extremely low.  By statistics, you have more of a chance of getting killed in a motor vehicle accident: while driving to the hospital than any of the above occurring .  Nevertheless, you should be aware that they are possibilities.  In general, it is similar to taking a shower.  Everybody knows that you can slip, hit your head and get killed.  Does that mean that you should not shower again?  Nevertheless always keep in mind that statistics do not mean anything if you happen to be on the wrong side of them.  Even if a procedure has a 1 (one) in a 1,000,000  (million) chance of going wrong, it you happen to be that one..Also, keep in mind that by statistics, you have more of a chance of having something go wrong when taking medications.  Who should not have this procedure? If you are on a blood thinning medication (e.g. Coumadin, Plavix, see list of "Blood Thinners"), or if you have an active infection going on, you should not have the procedure.  If you are taking any blood thinners, please inform your physician.  How should I prepare for this procedure?  Do not eat or drink anything at least six hours prior to the procedure.  Bring a driver with you .  It cannot be a taxi.  Come accompanied by an adult that can drive you back, and that is strong enough to help you if your legs get weak or numb from the local anesthetic.  Take all of your medicines the morning of the procedure with just enough water to swallow them.  If you have diabetes, make sure that you are scheduled to have your procedure done first thing in the morning, whenever possible.  If you have diabetes, take only half of your insulin dose and notify our nurse that you have done so as soon as you arrive at the clinic.  If you are diabetic, but only take blood sugar pills (oral hypoglycemic), then do not take them on the morning of your procedure.  You may take them after you have had the procedure.  Do not take aspirin or any aspirin-containing medications, at least eleven (11) days prior to the procedure.  They may prolong bleeding.  Wear loose fitting clothing that may be easy to take off and that you would not mind if it got stained with Betadine or blood.  Do not wear any jewelry or perfume  Remove any nail coloring.  It will interfere with some of our monitoring equipment.  NOTE: Remember that this is not meant to be interpreted as a complete list of all possible complications.  Unforeseen problems may occur.  BLOOD THINNERS The following drugs contain aspirin or other  products, which can cause increased bleeding during surgery and should not be taken for 2 weeks prior to and 1 week after surgery.  If you should need take something for relief of minor pain, you may take acetaminophen which is found in Tylenol,m Datril, Anacin-3 and Panadol. It is not blood thinner. The products listed below are.  Do not take any of   the products listed below in addition to any listed on your instruction sheet.  A.P.C or A.P.C with Codeine Codeine Phosphate Capsules #3 Ibuprofen Ridaura  ABC compound Congesprin Imuran rimadil  Advil Cope Indocin Robaxisal  Alka-Seltzer Effervescent Pain Reliever and Antacid Coricidin or Coricidin-D  Indomethacin Rufen  Alka-Seltzer plus Cold Medicine Cosprin Ketoprofen S-A-C Tablets  Anacin Analgesic Tablets or Capsules Coumadin Korlgesic Salflex  Anacin Extra Strength Analgesic tablets or capsules CP-2 Tablets Lanoril Salicylate  Anaprox Cuprimine Capsules Levenox Salocol  Anexsia-D Dalteparin Magan Salsalate  Anodynos Darvon compound Magnesium Salicylate Sine-off  Ansaid Dasin Capsules Magsal Sodium Salicylate  Anturane Depen Capsules Marnal Soma  APF Arthritis pain formula Dewitt's Pills Measurin Stanback  Argesic Dia-Gesic Meclofenamic Sulfinpyrazone  Arthritis Bayer Timed Release Aspirin Diclofenac Meclomen Sulindac  Arthritis pain formula Anacin Dicumarol Medipren Supac  Analgesic (Safety coated) Arthralgen Diffunasal Mefanamic Suprofen  Arthritis Strength Bufferin Dihydrocodeine Mepro Compound Suprol  Arthropan liquid Dopirydamole Methcarbomol with Aspirin Synalgos  ASA tablets/Enseals Disalcid Micrainin Tagament  Ascriptin Doan's Midol Talwin  Ascriptin A/D Dolene Mobidin Tanderil  Ascriptin Extra Strength Dolobid Moblgesic Ticlid  Ascriptin with Codeine Doloprin or Doloprin with Codeine Momentum Tolectin  Asperbuf Duoprin Mono-gesic Trendar  Aspergum Duradyne Motrin or Motrin IB Triminicin  Aspirin plain, buffered or enteric  coated Durasal Myochrisine Trigesic  Aspirin Suppositories Easprin Nalfon Trillsate  Aspirin with Codeine Ecotrin Regular or Extra Strength Naprosyn Uracel  Atromid-S Efficin Naproxen Ursinus  Auranofin Capsules Elmiron Neocylate Vanquish  Axotal Emagrin Norgesic Verin  Azathioprine Empirin or Empirin with Codeine Normiflo Vitamin E  Azolid Emprazil Nuprin Voltaren  Bayer Aspirin plain, buffered or children's or timed BC Tablets or powders Encaprin Orgaran Warfarin Sodium  Buff-a-Comp Enoxaparin Orudis Zorpin  Buff-a-Comp with Codeine Equegesic Os-Cal-Gesic   Buffaprin Excedrin plain, buffered or Extra Strength Oxalid   Bufferin Arthritis Strength Feldene Oxphenbutazone   Bufferin plain or Extra Strength Feldene Capsules Oxycodone with Aspirin   Bufferin with Codeine Fenoprofen Fenoprofen Pabalate or Pabalate-SF   Buffets II Flogesic Panagesic   Buffinol plain or Extra Strength Florinal or Florinal with Codeine Panwarfarin   Buf-Tabs Flurbiprofen Penicillamine   Butalbital Compound Four-way cold tablets Penicillin   Butazolidin Fragmin Pepto-Bismol   Carbenicillin Geminisyn Percodan   Carna Arthritis Reliever Geopen Persantine   Carprofen Gold's salt Persistin   Chloramphenicol Goody's Phenylbutazone   Chloromycetin Haltrain Piroxlcam   Clmetidine heparin Plaquenil   Cllnoril Hyco-pap Ponstel   Clofibrate Hydroxy chloroquine Propoxyphen         Before stopping any of these medications, be sure to consult the physician who ordered them.  Some, such as Coumadin (Warfarin) are ordered to prevent or treat serious conditions such as "deep thrombosis", "pumonary embolisms", and other heart problems.  The amount of time that you may need off of the medication may also vary with the medication and the reason for which you were taking it.  If you are taking any of these medications, please make sure you notify your pain physician before you undergo any procedures.    Radiofrequency  Lesioning Radiofrequency lesioning is a procedure that is performed to relieve pain. The procedure is often used for back, neck, or arm pain. Radiofrequency lesioning involves the use of a machine that creates radio waves to make heat. During the procedure, the heat is applied to the nerve that carries the pain signal. The heat damages the nerve and interferes with the pain signal. Pain relief usually starts about 2 weeks after the procedure and lasts for 6   months to 1 year. Tell a health care provider about:  Any allergies you have.  All medicines you are taking, including vitamins, herbs, eye drops, creams, and over-the-counter medicines.  Any problems you or family members have had with anesthetic medicines.  Any blood disorders you have.  Any surgeries you have had.  Any medical conditions you have.  Whether you are pregnant or may be pregnant. What are the risks? Generally, this is a safe procedure. However, problems may occur, including:  Pain or soreness at the injection site.  Infection at the injection site.  Damage to nerves or blood vessels.  What happens before the procedure?  Ask your health care provider about: ? Changing or stopping your regular medicines. This is especially important if you are taking diabetes medicines or blood thinners. ? Taking medicines such as aspirin and ibuprofen. These medicines can thin your blood. Do not take these medicines before your procedure if your health care provider instructs you not to.  Follow instructions from your health care provider about eating or drinking restrictions.  Plan to have someone take you home after the procedure.  If you go home right after the procedure, plan to have someone with you for 24 hours. What happens during the procedure?  You will be given one or more of the following: ? A medicine to help you relax (sedative). ? A medicine to numb the area (local anesthetic).  You will be awake during the  procedure. You will need to be able to talk with the health care provider during the procedure.  With the help of a type of X-ray (fluoroscopy), the health care provider will insert a radiofrequency needle into the area to be treated.  Next, a wire that carries the radio waves (electrode) will be put through the radiofrequency needle. An electrical pulse will be sent through the electrode to verify the correct nerve. You will feel a tingling sensation, and you may have muscle twitching.  Then, the tissue that is around the needle tip will be heated by an electric current that is passed using the radiofrequency machine. This will numb the nerves.  A bandage (dressing) will be put on the insertion area after the procedure is done. The procedure may vary among health care providers and hospitals. What happens after the procedure?  Your blood pressure, heart rate, breathing rate, and blood oxygen level will be monitored often until the medicines you were given have worn off.  Return to your normal activities as directed by your health care provider. This information is not intended to replace advice given to you by your health care provider. Make sure you discuss any questions you have with your health care provider. Document Released: 12/12/2010 Document Revised: 09/21/2015 Document Reviewed: 05/23/2014 Elsevier Interactive Patient Education  2018 [employer]  

## 2018-01-19 NOTE — Progress Notes (Signed)
   THERAPIST PROGRESS NOTE  Session Time: 14mn  Participation Level: Active  Behavioral Response: CasualAlertEuthymic  Type of Therapy: Individual Therapy  Treatment Goals addressed: Coping  Interventions: CBT and Motivational Interviewing  Summary: Kyle Devincenziis a 62y.o. male who presents with continued symptoms of diagnosis.  Therapist met with Patient in an outpatient setting to assess current mood and assist with making progress towards goals through the use of therapeutic intervention. Therapist did a brief mood check, assessing anger, fear, disgust, excitement, happiness, and sadness.  Patient reports that he is doing well and wants to continue on current path.  He reports taking medication and using coping skills to manage symptoms.  Discussed using on journaling and drawing to release thoughts and feelings.    Suicidal/Homicidal: No  Plan: Return again in 4 weeks.  Diagnosis: Axis I: Depression    Axis II: No diagnosis    NLubertha South LCSW 12/17/2017

## 2018-01-20 ENCOUNTER — Ambulatory Visit: Payer: Medicare Other | Admitting: Licensed Clinical Social Worker

## 2018-01-21 ENCOUNTER — Ambulatory Visit (INDEPENDENT_AMBULATORY_CARE_PROVIDER_SITE_OTHER): Payer: Medicare Other | Admitting: Licensed Clinical Social Worker

## 2018-01-21 DIAGNOSIS — F330 Major depressive disorder, recurrent, mild: Secondary | ICD-10-CM | POA: Diagnosis not present

## 2018-01-21 NOTE — Progress Notes (Signed)
   THERAPIST PROGRESS NOTE  Session Time: 71mn  Participation Level: Active  Behavioral Response: CasualAlertEuthymic  Type of Therapy: Individual Therapy  Treatment Goals addressed: Diagnosis: Depression  Interventions: CBT and Motivational Interviewing  Summary: APaco Cislois a 62y.o. male who presents with a reduction in symptoms.  Therapist met with Patient in an outpatient setting to assess current mood and assist with making progress towards goals through the use of therapeutic intervention. Therapist did a brief mood check, assessing anger, fear, disgust, excitement, happiness, and sadness.  Patient describes his mood as "good."  Explored with Patient how he manages his symptoms/mood. Discussed expectations of friends and boyfriend.Reviewed coping skills and and discussed how to incorporate an increase frequency of coping skills.    Suicidal/Homicidal: No   Plan: Return again in 2 weeks.  Diagnosis: Axis I: Depression    Axis II: No diagnosis    NLubertha South LCSW 01/21/2018

## 2018-01-28 ENCOUNTER — Ambulatory Visit: Payer: Medicare Other | Admitting: Student in an Organized Health Care Education/Training Program

## 2018-02-04 ENCOUNTER — Ambulatory Visit: Payer: Medicare Other | Admitting: Student in an Organized Health Care Education/Training Program

## 2018-02-11 ENCOUNTER — Other Ambulatory Visit: Payer: Self-pay

## 2018-02-11 ENCOUNTER — Ambulatory Visit (INDEPENDENT_AMBULATORY_CARE_PROVIDER_SITE_OTHER): Payer: Medicare Other | Admitting: Psychiatry

## 2018-02-11 ENCOUNTER — Encounter: Payer: Self-pay | Admitting: Psychiatry

## 2018-02-11 VITALS — BP 164/88 | HR 82 | Temp 97.8°F | Wt 267.0 lb

## 2018-02-11 DIAGNOSIS — F330 Major depressive disorder, recurrent, mild: Secondary | ICD-10-CM

## 2018-02-11 DIAGNOSIS — F4010 Social phobia, unspecified: Secondary | ICD-10-CM | POA: Diagnosis not present

## 2018-02-11 DIAGNOSIS — F640 Transsexualism: Secondary | ICD-10-CM

## 2018-02-11 DIAGNOSIS — F410 Panic disorder [episodic paroxysmal anxiety] without agoraphobia: Secondary | ICD-10-CM

## 2018-02-11 NOTE — Progress Notes (Addendum)
BH MD OP Progress Note  02/11/2018 5:02 PM Kyle Hood  MRN:  [id number]  Chief Complaint: ' I am here for follow up.' Chief Complaint    Follow-up; Medication Refill     HPI: Kyle Hood is a 62 yr old transgender male to male, lives in [place], has a history of depression, panic disorder, social anxiety disorder, presented to the clinic today for a follow-up visit.  Patient today reports that they have been recovering from cellulitis of lower extremities.  Patient was on antibiotics which was completed couple of days ago.  Patient continues to have low energy and fatigue likely due to having the cellulitis.  Patient reports mood symptoms as improving.  There are times when they feel depressed however they were  able to manage it and reports does not want any medication changes today.  Patient reports sleep continues to be good.  Continues to take Seroquel as needed.  Patient continues to have a good relationship with their partner.  Patient continues to be in psychotherapy session which is going well.  Patient denies any suicidality, perceptual disturbances or homicidality. Visit Diagnosis:    ICD-10-CM   1. MDD (major depressive disorder), recurrent episode, mild (HCC) F33.0    improving  2. Social anxiety disorder F40.10   3. Panic disorder F41.0   4. 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 F64.0     Past Psychiatric History: I have reviewed past psychiatric history from my progress note on 06/05/2017.  Past trials of Zoloft, Wellbutrin  Past Medical History:  Past Medical History:  Diagnosis Date  . Abnormal liver function test   . Anxiety   . Anxiety   . Arthritis   . Chronic lower back pain   . Degenerative joint disease   . Depression   . Fatty liver   . Heart murmur   . Hepatitis B   . Hyperlipidemia   . Hypertension   . Lumbar polyradiculopathy   . Panic attacks   . Polyneuropathy   . Vertigo 05/03/2017   ER    Past Surgical History:  Procedure Laterality Date  .  COLONOSCOPY    . COLONOSCOPY WITH PROPOFOL N/A 10/01/2017   Procedure: COLONOSCOPY WITH PROPOFOL;  Surgeon: Toledo, Boykin Nearing, MD;  Location: [facility] ENDOSCOPY;  Service: Endoscopy;  Laterality: N/A;  . PROSTATE BIOPSY      Family Psychiatric History: I have reviewed family psychiatric history from my progress note on 06/05/2017  Family History:  Family History  Problem Relation Age of Onset  . Cancer Mother        Pancreatic  . Diabetes Mother   . Hypertension Mother   . Varicose Veins Mother   . Alcohol abuse Mother   . Heart disease Father        CABG  . Cancer Father        Jaw  . Aortic aneurysm Father   . Congestive Heart Failure Father   . Hypertension Father   . Varicose Veins Father   . Alcohol abuse Father   . Bipolar disorder Sister   . Bipolar disorder Brother     Social History: Reviewed social history from my progress note on 06/05/2017 Social History   Socioeconomic History  . Marital status: Significant Other    Spouse name: denise  . Number of children: 0  . Years of education: Not on file  . Highest education level: 11th grade  Occupational History    Comment: disabled  Social Needs  . Financial resource strain: Very  hard  . Food insecurity:    Worry: Never true    Inability: Never true  . Transportation needs:    Medical: No    Non-medical: No  Tobacco Use  . Smoking status: Never Smoker  . Smokeless tobacco: Never Used  Substance and Sexual Activity  . Alcohol use: No    Frequency: Never  . Drug use: No  . Sexual activity: Not Currently  Lifestyle  . Physical activity:    Days per week: 0 days    Minutes per session: 0 min  . Stress: Not on file  Relationships  . Social connections:    Talks on phone: Twice a week    Gets together: Never    Attends religious service: Never    Active member of club or organization: No    Attends meetings of clubs or organizations: Never    Relationship status: Living with partner  Other Topics Concern   . Not on file  Social History Narrative  . Not on file    Allergies:  Allergies  Allergen Reactions  . Lisinopril Cough    Metabolic Disorder Labs: Lab Results  Component Value Date   HGBA1C 6.1 08/16/2014   No results found for: PROLACTIN Lab Results  Component Value Date   CHOL 145 08/16/2014   TRIG 320 (A) 08/16/2014   HDL 36 08/16/2014   LDLCALC 45 08/16/2014   Lab Results  Component Value Date   TSH 1.61 08/16/2014    Therapeutic Level Labs: No results found for: LITHIUM No results found for: VALPROATE No components found for:  CBMZ  Current Medications: Current Outpatient Medications  Medication Sig Dispense Refill  . diclofenac sodium (VOLTAREN) 1 % GEL Apply topically 4 (four) times daily.    . hydrochlorothiazide (HYDRODIURIL) 25 MG tablet Take 25 mg by mouth daily.    . pravastatin (PRAVACHOL) 10 MG tablet Take 10 mg by mouth daily.    . QUEtiapine (SEROQUEL) 25 MG tablet TAKE 1 TABLET(25 MG) BY MOUTH AT BEDTIME FOR MOOD OR SLEEP 90 tablet 2  . Vilazodone HCl (VIIBRYD) 20 MG TABS TAKE 1 TABLET(20 MG) BY MOUTH DAILY 90 tablet 1   No current facility-administered medications for this visit.      Musculoskeletal: Strength & Muscle Tone: within normal limits Gait & Station: normal Patient leans: N/A  Psychiatric Specialty Exam: Review of Systems  Psychiatric/Behavioral: Positive for depression (improving).  All other systems reviewed and are negative.   Blood pressure (!) 164/88, pulse 82, temperature 97.8 F (36.6 C), temperature source Oral, weight 267 lb (121.1 kg).Body mass index is 36.21 kg/m.  General Appearance: Casual  Eye Contact:  Fair  Speech:  Clear and Coherent  Volume:  Normal  Mood:  Dysphoric  Affect:  Congruent  Thought Process:  Goal Directed and Descriptions of Associations: Intact  Orientation:  Full (Time, Place, and Person)  Thought Content: Logical   Suicidal Thoughts:  No  Homicidal Thoughts:  No  Memory:   Immediate;   Fair Recent;   Fair Remote;   Fair  Judgement:  Fair  Insight:  Fair  Psychomotor Activity:  Normal  Concentration:  Concentration: Fair and Attention Span: Fair  Recall:  [employer] of Knowledge: Fair  Language: Fair  Akathisia:  No  Handed:  Right  AIMS (if indicated): denies tremors, rigidity  Assets:  Communication Skills Desire for Improvement Social Support  ADL's:  Intact  Cognition: WNL  Sleep:  Fair   Screenings: PHQ2-9  Office Visit from 01/08/2018 in [facility] Office Visit from 07/03/2017 in [facility] Procedure visit from 06/11/2017 in [facility] Office Visit from 04/03/2017 in [facility] Procedure visit from 03/05/2017 in [facility]  PHQ-2 Total Score  0  0  0  0  0       Assessment and Plan: Degan is a 62 year old male to male transgender with history of depression, anxiety, presented to the clinic today for a follow-up visit.  Patient is currently recovering from cellulitis reports some fatigue and low energy.  Patient however continues to be in psychotherapy sessions.  Reports does not want any medication changes today.  Plan For depression Continue Viibryd 20 mg p.o. daily Continue Seroquel, however discussed to take 12.5 mg p.o. nightly as needed to see if dose reduction still helps with sleep.  Patient reports gradually wanting to be tapered off of some of the medications. PHQ9 equals 10  For anxiety symptoms Viibryd 20 mg p.o. daily Continue CBT GAD 7 equals 5  For insomnia Seroquel 12.5-25 mg p.o. nightly as needed  Elevated blood pressure-discussed with patient to follow-up with primary medical doctor.  Patient has upcoming appointment next week.  Follow-up in clinic in 1 month or sooner if needed.  More than 50 %  of the time was spent for psychoeducation and supportive psychotherapy and care coordination.  This note was generated in part or whole with voice recognition software. Voice recognition is usually quite accurate but there are transcription errors that can and very often do occur. I apologize for any typographical errors that were not detected and corrected.       Jomarie Longs, MD 02/11/2018, 5:02 PM

## 2018-02-18 ENCOUNTER — Ambulatory Visit (INDEPENDENT_AMBULATORY_CARE_PROVIDER_SITE_OTHER): Payer: Medicare Other | Admitting: Licensed Clinical Social Worker

## 2018-02-18 DIAGNOSIS — F330 Major depressive disorder, recurrent, mild: Secondary | ICD-10-CM | POA: Diagnosis not present

## 2018-02-24 NOTE — Progress Notes (Signed)
   THERAPIST PROGRESS NOTE  Session Time: 45mn  Participation Level: Active  Behavioral Response: CasualAlertEuthymic  Type of Therapy: Individual Therapy  Treatment Goals addressed: Coping  Interventions: CBT and Motivational Interviewing  Summary: Kyle Hood a 62y.o. male who presents with continued symptoms of diagnosis.  Therapist met with Patient in an outpatient setting to assess current mood and assist with making progress towards goals through the use of therapeutic intervention. Therapist did a brief mood check, assessing anger, fear, disgust, excitement, happiness, and sadness.  Patient reports "favorable" mood.  Factors that contribute to client's ongoing depressive symptoms were discussed and include real and perceived feelings of isolation, criticism, rejection, shame and guilt.    Suicidal/Homicidal: No  Plan: Return again in 2 weeks.  Diagnosis: Axis I: Depression    Axis II: No diagnosis    NLubertha South LCSW 02/18/2018

## 2018-02-25 ENCOUNTER — Encounter: Payer: Self-pay | Admitting: Student in an Organized Health Care Education/Training Program

## 2018-02-25 ENCOUNTER — Ambulatory Visit (HOSPITAL_BASED_OUTPATIENT_CLINIC_OR_DEPARTMENT_OTHER): Payer: Medicare Other | Admitting: Student in an Organized Health Care Education/Training Program

## 2018-02-25 ENCOUNTER — Ambulatory Visit
Admission: RE | Admit: 2018-02-25 | Discharge: 2018-02-25 | Disposition: A | Discharge status: Home / Self Care | Payer: Medicare Other | Source: Ambulatory Visit | Attending: Student in an Organized Health Care Education/Training Program | Admitting: Student in an Organized Health Care Education/Training Program

## 2018-02-25 ENCOUNTER — Other Ambulatory Visit: Payer: Self-pay

## 2018-02-25 VITALS — BP 142/86 | HR 78 | Temp 98.2°F | Resp 16 | Ht 72.0 in | Wt 260.0 lb

## 2018-02-25 DIAGNOSIS — M47816 Spondylosis without myelopathy or radiculopathy, lumbar region: Secondary | ICD-10-CM | POA: Insufficient documentation

## 2018-02-25 DIAGNOSIS — M545 Low back pain: Secondary | ICD-10-CM | POA: Diagnosis not present

## 2018-02-25 DIAGNOSIS — G8929 Other chronic pain: Secondary | ICD-10-CM | POA: Insufficient documentation

## 2018-02-25 DIAGNOSIS — Z888 Allergy status to other drugs, medicaments and biological substances status: Secondary | ICD-10-CM | POA: Insufficient documentation

## 2018-02-25 DIAGNOSIS — M25552 Pain in left hip: Secondary | ICD-10-CM | POA: Insufficient documentation

## 2018-02-25 MED ORDER — LACTATED RINGERS IV SOLN
1000.0000 mL | Freq: Once | INTRAVENOUS | Status: AC
  Administered 2018-02-25: 1000 mL via INTRAVENOUS

## 2018-02-25 MED ORDER — FENTANYL CITRATE (PF) 100 MCG/2ML IJ SOLN
INTRAMUSCULAR | Status: AC
  Filled 2018-02-25: qty 2

## 2018-02-25 MED ORDER — ROPIVACAINE HCL 2 MG/ML IJ SOLN
10.0000 mL | Freq: Once | INTRAMUSCULAR | Status: AC
  Administered 2018-02-25: 10 mL

## 2018-02-25 MED ORDER — DEXAMETHASONE SODIUM PHOSPHATE 10 MG/ML IJ SOLN
10.0000 mg | Freq: Once | INTRAMUSCULAR | Status: AC
  Administered 2018-02-25: 10 mg

## 2018-02-25 MED ORDER — ROPIVACAINE HCL 2 MG/ML IJ SOLN
INTRAMUSCULAR | Status: AC
  Filled 2018-02-25: qty 10

## 2018-02-25 MED ORDER — LIDOCAINE HCL 2 % IJ SOLN
20.0000 mL | Freq: Once | INTRAMUSCULAR | Status: AC
  Administered 2018-02-25: 400 mg

## 2018-02-25 MED ORDER — DEXAMETHASONE SODIUM PHOSPHATE 10 MG/ML IJ SOLN
INTRAMUSCULAR | Status: AC
  Filled 2018-02-25: qty 1

## 2018-02-25 MED ORDER — LIDOCAINE HCL 2 % IJ SOLN
INTRAMUSCULAR | Status: AC
  Filled 2018-02-25: qty 20

## 2018-02-25 MED ORDER — FENTANYL CITRATE (PF) 100 MCG/2ML IJ SOLN
25.0000 ug | INTRAMUSCULAR | Status: DC | PRN
  Administered 2018-02-25: 75 ug via INTRAVENOUS

## 2018-02-25 NOTE — Progress Notes (Signed)
Patient's Name: Kyle Hood  MRN: [id number]  Referring Provider: Orene Desanctis, MD  DOB: 01/10/1956  PCP: Orene Desanctis, MD  DOS: 02/25/2018  Note by: Edward Jolly, MD  Service setting: Ambulatory outpatient  Specialty: Interventional Pain Management  Patient type: Established  Location: [facility] (AMB) Pain Management Facility  Visit type: Interventional Procedure   Primary Reason for Visit: Interventional Pain Management Treatment. CC: Back Pain (low, left is worse) and Hip Pain (left)  Procedure:          Anesthesia, Analgesia, Anxiolysis:  Type: Thermal Lumbar Facet, Medial Branch Radiofrequency Ablation/Neurotomy           Primary Purpose: Therapeutic Region: Posterolateral Lumbosacral Spine Level L3, L4, L5,  Medial Branch Level(s). These levels will denervate the L3-4, L4-5, lumbar facet joints. Laterality: Left  Type: Moderate (Conscious) Sedation combined with Local Anesthesia Indication(s): Analgesia and Anxiety Route: Intravenous (IV) IV Access: Secured Sedation: Meaningful verbal contact was maintained at all times during the procedure  Local Anesthetic: Lidocaine 1-2%  Position: Prone   Indications: 1. Lumbar spondylosis    Kyle Hood has been dealing with the above chronic pain for longer than three months and has either failed to respond, was unable to tolerate, or simply did not get enough benefit from other more conservative therapies including, but not limited to: 1. Over-the-counter medications 2. Anti-inflammatory medications 3. Muscle relaxants 4. Membrane stabilizers 5. Opioids 6. Physical therapy and/or chiropractic manipulation 7. Modalities (Heat, ice, etc.) 8. Invasive techniques such as nerve blocks. Kyle Hood has attained more than 50% relief of the pain from a series of diagnostic injections conducted in separate occasions.  Pain Score: Pre-procedure: 7 /10 Post-procedure: 0-No pain/10  Pre-op Assessment:  Kyle Hood is a 62 y.o. (year  old), male patient, seen today for interventional treatment. He  has a past surgical history that includes Prostate biopsy; Colonoscopy; and Colonoscopy with propofol (N/A, 10/01/2017). Kyle Hood has a current medication list which includes the following prescription(s): diclofenac sodium, hydrochlorothiazide, pravastatin, quetiapine, and vilazodone hcl, and the following Facility-Administered Medications: fentanyl. His primarily concern today is the Back Pain (low, left is worse) and Hip Pain (left)  Initial Vital Signs:  Pulse/HCG Rate: 78ECG Heart Rate: 75 Temp: 98 F (36.7 C) Resp: 18 BP: 139/74 SpO2: 98 %  BMI: Estimated body mass index is 35.26 kg/m as calculated from the following:   Height as of this encounter: 6' (1.829 m).   Weight as of this encounter: 260 lb (117.9 kg).  Risk Assessment: Allergies: Reviewed. He is allergic to lisinopril.  Allergy Precautions: None required Coagulopathies: Reviewed. None identified.  Blood-thinner therapy: None at this time Active Infection(s): Reviewed. None identified. Kyle Hood is afebrile  Site Confirmation: Kyle Hood was asked to confirm the procedure and laterality before marking the site Procedure checklist: Completed Consent: Before the procedure and under the influence of no sedative(s), amnesic(s), or anxiolytics, the patient was informed of the treatment options, risks and possible complications. To fulfill our ethical and legal obligations, as recommended by the American Medical Association's Code of Ethics, I have informed the patient of my clinical impression; the nature and purpose of the treatment or procedure; the risks, benefits, and possible complications of the intervention; the alternatives, including doing nothing; the risk(s) and benefit(s) of the alternative treatment(s) or procedure(s); and the risk(s) and benefit(s) of doing nothing. The patient was provided information about the general risks and possible  complications associated with the procedure. These may include, but are not limited to:  failure to achieve desired goals, infection, bleeding, organ or nerve damage, allergic reactions, paralysis, and death. In addition, the patient was informed of those risks and complications associated to Spine-related procedures, such as failure to decrease pain; infection (i.e.: Meningitis, epidural or intraspinal abscess); bleeding (i.e.: epidural hematoma, subarachnoid hemorrhage, or any other type of intraspinal or peri-dural bleeding); organ or nerve damage (i.e.: Any type of peripheral nerve, nerve root, or spinal cord injury) with subsequent damage to sensory, motor, and/or autonomic systems, resulting in permanent pain, numbness, and/or weakness of one or several areas of the body; allergic reactions; (i.e.: anaphylactic reaction); and/or death. Furthermore, the patient was informed of those risks and complications associated with the medications. These include, but are not limited to: allergic reactions (i.e.: anaphylactic or anaphylactoid reaction(s)); adrenal axis suppression; blood sugar elevation that in diabetics may result in ketoacidosis or comma; water retention that in patients with history of congestive heart failure may result in shortness of breath, pulmonary edema, and decompensation with resultant heart failure; weight gain; swelling or edema; medication-induced neural toxicity; particulate matter embolism and blood vessel occlusion with resultant organ, and/or nervous system infarction; and/or aseptic necrosis of one or more joints. Finally, the patient was informed that Medicine is not an exact science; therefore, there is also the possibility of unforeseen or unpredictable risks and/or possible complications that may result in a catastrophic outcome. The patient indicated having understood very clearly. We have given the patient no guarantees and we have made no promises. Enough time was given to the  patient to ask questions, all of which were answered to the patient's satisfaction. Kyle Hood has indicated that he wanted to continue with the procedure. Attestation: I, the ordering provider, attest that I have discussed with the patient the benefits, risks, side-effects, alternatives, likelihood of achieving goals, and potential problems during recovery for the procedure that I have provided informed consent. Date  Time: 02/25/2018  8:06 AM  Pre-Procedure Preparation:  Monitoring: As per clinic protocol. Respiration, ETCO2, SpO2, BP, heart rate and rhythm monitor placed and checked for adequate function Safety Precautions: Patient was assessed for positional comfort and pressure points before starting the procedure. Time-out: I initiated and conducted the "Time-out" before starting the procedure, as per protocol. The patient was asked to participate by confirming the accuracy of the "Time Out" information. Verification of the correct person, site, and procedure were performed and confirmed by me, the nursing staff, and the patient. "Time-out" conducted as per Joint Commission's Universal Protocol (UP.01.01.01). Time: 0833  Description of Procedure:          Laterality: Left Levels:  L3, L4, L5, Medial Branch Level(s), at the L3-4, L4-5 lumbar facet joints. Area Prepped: Lumbosacral Prepping solution: ChloraPrep (2% chlorhexidine gluconate and 70% isopropyl alcohol) Safety Precautions: Aspiration looking for blood return was conducted prior to all injections. At no point did we inject any substances, as a needle was being advanced. Before injecting, the patient was told to immediately notify me if he was experiencing any new onset of "ringing in the ears, or metallic taste in the mouth". No attempts were made at seeking any paresthesias. Safe injection practices and needle disposal techniques used. Medications properly checked for expiration dates. SDV (single dose vial) medications used. After  the completion of the procedure, all disposable equipment used was discarded in the proper designated medical waste containers. Local Anesthesia: Protocol guidelines were followed. The patient was positioned over the fluoroscopy table. The area was prepped in the usual manner. The  time-out was completed. The target area was identified using fluoroscopy. A 12-in long, straight, sterile hemostat was used with fluoroscopic guidance to locate the targets for each level blocked. Once located, the skin was marked with an approved surgical skin marker. Once all sites were marked, the skin (epidermis, dermis, and hypodermis), as well as deeper tissues (fat, connective tissue and muscle) were infiltrated with a small amount of a short-acting local anesthetic, loaded on a 10cc syringe with a 25G, 1.5-in  Needle. An appropriate amount of time was allowed for local anesthetics to take effect before proceeding to the next step. Local Anesthetic: Lidocaine 2.0% The unused portion of the local anesthetic was discarded in the proper designated containers. Technical explanation of process:  Radiofrequency Ablation (RFA)  L3 Medial Branch Nerve RFA: The target area for the L3 medial branch is at the junction of the postero-lateral aspect of the superior articular process and the superior, posterior, and medial edge of the transverse process of L4. Under fluoroscopic guidance, a Radiofrequency needle was inserted until contact was made with os over the superior postero-lateral aspect of the pedicular shadow (target area). Sensory and motor testing was conducted to properly adjust the position of the needle. Once satisfactory placement of the needle was achieved, the numbing solution was slowly injected after negative aspiration for blood. 2.0 mL of the nerve block solution was injected without difficulty or complication. After waiting for at least 3 minutes, the ablation was performed. Once completed, the needle was removed  intact. L4 Medial Branch Nerve RFA: The target area for the L4 medial branch is at the junction of the postero-lateral aspect of the superior articular process and the superior, posterior, and medial edge of the transverse process of L5. Under fluoroscopic guidance, a Radiofrequency needle was inserted until contact was made with os over the superior postero-lateral aspect of the pedicular shadow (target area). Sensory and motor testing was conducted to properly adjust the position of the needle. Once satisfactory placement of the needle was achieved, the numbing solution was slowly injected after negative aspiration for blood. 2.0 mL of the nerve block solution was injected without difficulty or complication. After waiting for at least 3 minutes, the ablation was performed. Once completed, the needle was removed intact. L5 Medial Branch Nerve RFA: The target area for the L5 medial branch is at the junction of the postero-lateral aspect of the superior articular process of S1 and the superior, posterior, and medial edge of the sacral ala. Under fluoroscopic guidance, a Radiofrequency needle was inserted until contact was made with os over the superior postero-lateral aspect of the pedicular shadow (target area). Sensory and motor testing was conducted to properly adjust the position of the needle. Once satisfactory placement of the needle was achieved, the numbing solution was slowly injected after negative aspiration for blood. 2.0 mL of the nerve block solution was injected without difficulty or complication. After waiting for at least 3 minutes, the ablation was performed. Once completed, the needle was removed intact.  Radiofrequency lesioning (ablation):  Radiofrequency Generator: NeuroTherm [id number] Sensory Stimulation Parameters: 50 Hz was used to locate & identify the nerve, making sure that the needle was positioned such that there was no sensory stimulation below 0.3 V or above 0.7 V. Motor  Stimulation Parameters: 2 Hz was used to evaluate the motor component. Care was taken not to lesion any nerves that demonstrated motor stimulation of the lower extremities at an output of less than 2.5 times that of the sensory threshold,  or a maximum of 2.0 V. Lesioning Technique Parameters: Standard Radiofrequency settings. (Not bipolar or pulsed.) Temperature Settings: 80 degrees C Lesioning time: 60 seconds Intra-operative Compliance: Compliant Materials & Medications: Needle(s) (Electrode/Cannula) Type: Teflon-coated, curved tip, Radiofrequency needle(s) Gauge: 22G Length: 10cm Numbing solution: 6 cc solution made of 5 cc of 0.2% ropivacaine, 1 cc of Decadron 10 mg/cc.  2 cc injected at each level pre-lesioning.  The unused portion of the solution was discarded in the proper designated containers.  Once the entire procedure was completed, the treated area was cleaned, making sure to leave some of the prepping solution back to take advantage of its long term bactericidal properties.  Illustration of the posterior view of the lumbar spine and the posterior neural structures. Laminae of L2 through S1 are labeled. DPRL5, dorsal primary ramus of L5; DPRS1, dorsal primary ramus of S1; DPR3, dorsal primary ramus of L3; FJ, facet (zygapophyseal) joint L3-L4; I, inferior articular process of L4; LB1, lateral branch of dorsal primary ramus of L1; IAB, inferior articular branches from L3 medial branch (supplies L4-L5 facet joint); IBP, intermediate branch plexus; MB3, medial branch of dorsal primary ramus of L3; NR3, third lumbar nerve root; S, superior articular process of L5; SAB, superior articular branches from L4 (supplies L4-5 facet joint also); TP3, transverse process of L3.  Vitals:   02/25/18 0900 02/25/18 0909 02/25/18 0919 02/25/18 0929  BP: (!) 143/94 (!) 148/78 (!) 148/83 (!) 142/86  Pulse:      Resp: 15 14 13 16   Temp:    98.2 F (36.8 C)  TempSrc:      SpO2: 96% 98% 99% 99%  Weight:       Height:        Start Time: 0833 hrs. End Time: 0900 hrs.  Imaging Guidance (Spinal):          Type of Imaging Technique: Fluoroscopy Guidance (Spinal) Indication(s): Assistance in needle guidance and placement for procedures requiring needle placement in or near specific anatomical locations not easily accessible without such assistance. Exposure Time: Please see nurses notes. Contrast: None used. Fluoroscopic Guidance: I was personally present during the use of fluoroscopy. "Tunnel Vision Technique" used to obtain the best possible view of the target area. Parallax error corrected before commencing the procedure. "Direction-depth-direction" technique used to introduce the needle under continuous pulsed fluoroscopy. Once target was reached, antero-posterior, oblique, and lateral fluoroscopic projection used confirm needle placement in all planes. Images permanently stored in EMR. Interpretation: No contrast injected. I personally interpreted the imaging intraoperatively. Adequate needle placement confirmed in multiple planes. Permanent images saved into the patient's record.  Antibiotic Prophylaxis:   Anti-infectives (From admission, onward)   None     Indication(s): None identified  Post-operative Assessment:  Post-procedure Vital Signs:  Pulse/HCG Rate: 7874 Temp: 98.2 F (36.8 C) Resp: 16 BP: (!) 142/86 SpO2: 99 %  EBL: None  Complications: No immediate post-treatment complications observed by team, or reported by patient.  Note: The patient tolerated the entire procedure well. A repeat set of vitals were taken after the procedure and the patient was kept under observation following institutional policy, for this type of procedure. Post-procedural neurological assessment was performed, showing return to baseline, prior to discharge. The patient was provided with post-procedure discharge instructions, including a section on how to identify potential problems. Should any  problems arise concerning this procedure, the patient was given instructions to immediately contact us, at any time, without hesitation. In any case, we plan to contact the patient  by telephone for a follow-up status report regarding this interventional procedure.  Comments:  No additional relevant information. 5 out of 5 strength bilateral lower extremity: Plantar flexion, dorsiflexion, knee flexion, knee extension.  Plan of Care    Imaging Orders     DG C-Arm 1-60 Min-No Report Procedure Orders    No procedure(s) ordered today    Medications ordered for procedure: Meds ordered this encounter  Medications  . lactated ringers infusion 1,000 mL  . fentaNYL (SUBLIMAZE) injection 25-100 mcg    Make sure Narcan is available in the pyxis when using this medication. In the event of respiratory depression (RR< 8/min): Titrate NARCAN (naloxone) in increments of 0.1 to 0.2 mg IV at 2-3 minute intervals, until desired degree of reversal.  . ropivacaine (PF) 2 mg/mL (0.2%) (NAROPIN) injection 10 mL  . lidocaine (XYLOCAINE) 2 % (with pres) injection 400 mg  . dexamethasone (DECADRON) injection 10 mg   Medications administered: We administered lactated ringers, fentaNYL, ropivacaine (PF) 2 mg/mL (0.2%), lidocaine, and dexamethasone.  See the medical record for exact dosing, route, and time of administration.  Disposition: Discharge home  Discharge Date & Time: 02/25/2018; 0929 hrs.   Physician-requested Follow-up: Return in about 6 weeks (around 04/08/2018) for Post Procedure Evaluation.  Future Appointments  Date Time Provider Department Center  03/19/2018  1:00 PM Marinda Elk, [place] [facility] None  04/07/2018  9:30 AM Edward Jolly, MD [facility] None   Primary Care Physician: Orene Desanctis, MD Location: [facility] Note by: Edward Jolly, MD Date: 02/25/2018; Time: 12:06 PM  Disclaimer:  Medicine is not an exact science. The only guarantee in  medicine is that nothing is guaranteed. It is important to note that the decision to proceed with this intervention was based on the information collected from the patient. The Data and conclusions were drawn from the patient's questionnaire, the interview, and the physical examination. Because the information was provided in large part by the patient, it cannot be guaranteed that it has not been purposely or unconsciously manipulated. Every effort has been made to obtain as much relevant data as possible for this evaluation. It is important to note that the conclusions that lead to this procedure are derived in large part from the available data. Always take into account that the treatment will also be dependent on availability of resources and existing treatment guidelines, considered by other Pain Management Practitioners as being common knowledge and practice, at the time of the intervention. For Medico-Legal purposes, it is also important to point out that variation in procedural techniques and pharmacological choices are the acceptable norm. The indications, contraindications, technique, and results of the above procedure should only be interpreted and judged by a Board-Certified Interventional Pain Specialist with extensive familiarity and expertise in the same exact procedure and technique.

## 2018-02-25 NOTE — Patient Instructions (Signed)

## 2018-02-25 NOTE — Progress Notes (Signed)
Safety precautions to be maintained throughout the outpatient stay will include: orient to surroundings, keep bed in low position, maintain call bell within reach at all times, provide assistance with transfer out of bed and ambulation.  

## 2018-02-26 ENCOUNTER — Telehealth: Payer: Self-pay

## 2018-02-26 NOTE — Telephone Encounter (Signed)
Post procedure phone call.  LM 

## 2018-03-03 ENCOUNTER — Telehealth: Payer: Self-pay

## 2018-03-03 NOTE — Telephone Encounter (Signed)
Pt left VM stating he wanted call back from Nurse, He had procedure last Wed and is still having a lot of pain. Please call him back

## 2018-03-03 NOTE — Telephone Encounter (Signed)
Patient advised that it may be expected to have more than usual pain following a RFA. Denies fever, shortness of breath.

## 2018-03-19 ENCOUNTER — Other Ambulatory Visit: Payer: Self-pay

## 2018-03-19 ENCOUNTER — Ambulatory Visit (INDEPENDENT_AMBULATORY_CARE_PROVIDER_SITE_OTHER): Payer: Medicare Other | Admitting: Psychiatry

## 2018-03-19 ENCOUNTER — Encounter: Payer: Self-pay | Admitting: Psychiatry

## 2018-03-19 ENCOUNTER — Ambulatory Visit (INDEPENDENT_AMBULATORY_CARE_PROVIDER_SITE_OTHER): Payer: Medicare Other | Admitting: Licensed Clinical Social Worker

## 2018-03-19 VITALS — BP 161/91 | HR 89 | Temp 97.6°F | Wt 268.6 lb

## 2018-03-19 DIAGNOSIS — F410 Panic disorder [episodic paroxysmal anxiety] without agoraphobia: Secondary | ICD-10-CM | POA: Diagnosis not present

## 2018-03-19 DIAGNOSIS — F640 Transsexualism: Secondary | ICD-10-CM

## 2018-03-19 DIAGNOSIS — F4010 Social phobia, unspecified: Secondary | ICD-10-CM | POA: Diagnosis not present

## 2018-03-19 DIAGNOSIS — F330 Major depressive disorder, recurrent, mild: Secondary | ICD-10-CM

## 2018-03-19 NOTE — Progress Notes (Signed)
BH MD OP Progress Note  03/19/2018 2:48 PM Kyle Hood  MRN:  [id number]  Chief Complaint: ' I am here for follow up." Chief Complaint    Follow-up; Medication Refill     HPI: Kyle Hood is a 62 year old transgendered male to male, lives in [place], has a history of depression, panic disorder, social anxiety disorder, presented to the clinic today for a follow-up visit.  Patient today reports they are doing well on current medication regimen. Pt reports that recently they had an ablation done to their back for pain. Patient continues to have some side effects and pain from the procedure at this time.  Patient reports they have reached out to their pain provider who is currently working on the same.  Patient reports mood symptoms as fair.  They did have a panic attack recently however was able to cope with it and it did not last too long.  Patient reports hence they do not want any changes with her medication at this time.  Patient denies any suicidality.  Patient denies any perceptual disturbances.  Patient reports sleep is good.  Patient continues to enjoy hobbies and reports them as a good distraction technique.  Patient continues to have good social support system from their partner.    Visit Diagnosis:    ICD-10-CM   1. MDD (major depressive disorder), recurrent episode, mild (HCC) F33.0   2. Social anxiety disorder F40.10   3. Panic disorder F41.0   4. 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 F64.0     Past Psychiatric History: Reviewed past psychiatric history from my progress note on 06/05/2017.  Past trials of Zoloft, Wellbutrin  Past Medical History:  Past Medical History:  Diagnosis Date  . Abnormal liver function test   . Anxiety   . Anxiety   . Arthritis   . Chronic lower back pain   . Degenerative joint disease   . Depression   . Fatty liver   . Heart murmur   . Hepatitis B   . Hyperlipidemia   . Hypertension   . Lumbar polyradiculopathy   . Panic attacks   .  Polyneuropathy   . Vertigo 05/03/2017   ER    Past Surgical History:  Procedure Laterality Date  . COLONOSCOPY    . COLONOSCOPY WITH PROPOFOL N/A 10/01/2017   Procedure: COLONOSCOPY WITH PROPOFOL;  Surgeon: Toledo, [name], MD;  Location: [facility] ENDOSCOPY;  Service: Endoscopy;  Laterality: N/A;  . PROSTATE BIOPSY      Family Psychiatric History: Reviewed family psychiatric history from my progress note on 06/25/2017  Family History:  Family History  Problem Relation Age of Onset  . Cancer Mother        Pancreatic  . Diabetes Mother   . Hypertension Mother   . Varicose Veins Mother   . Alcohol abuse Mother   . Heart disease Father        CABG  . Cancer Father        Jaw  . Aortic aneurysm Father   . Congestive Heart Failure Father   . Hypertension Father   . Varicose Veins Father   . Alcohol abuse Father   . Bipolar disorder Sister   . Bipolar disorder Brother     Social History: Reviewed social history from my progress note on 06/05/2017 Social History   Socioeconomic History  . Marital status: Significant Other    Spouse name: denise  . Number of children: 0  . Years of education: Not on file  .  Highest education level: 11th grade  Occupational History    Comment: disabled  Social Needs  . Financial resource strain: Very hard  . Food insecurity:    Worry: Never true    Inability: Never true  . Transportation needs:    Medical: No    Non-medical: No  Tobacco Use  . Smoking status: Never Smoker  . Smokeless tobacco: Never Used  Substance and Sexual Activity  . Alcohol use: No    Frequency: Never  . Drug use: No  . Sexual activity: Not Currently  Lifestyle  . Physical activity:    Days per week: 0 days    Minutes per session: 0 min  . Stress: Not on file  Relationships  . Social connections:    Talks on phone: Twice a week    Gets together: Never    Attends religious service: Never    Active member of club or organization: No    Attends meetings of  clubs or organizations: Never    Relationship status: Living with partner  Other Topics Concern  . Not on file  Social History Narrative  . Not on file    Allergies:  Allergies  Allergen Reactions  . Lisinopril Cough    Metabolic Disorder Labs: Lab Results  Component Value Date   HGBA1C 6.1 08/16/2014   No results found for: PROLACTIN Lab Results  Component Value Date   CHOL 145 08/16/2014   TRIG 320 (A) 08/16/2014   HDL 36 08/16/2014   LDLCALC 45 08/16/2014   Lab Results  Component Value Date   TSH 1.61 08/16/2014    Therapeutic Level Labs: No results found for: LITHIUM No results found for: VALPROATE No components found for:  CBMZ  Current Medications: Current Outpatient Medications  Medication Sig Dispense Refill  . diclofenac sodium (VOLTAREN) 1 % GEL Apply topically 4 (four) times daily.    . hydrochlorothiazide (HYDRODIURIL) 25 MG tablet Take 25 mg by mouth daily.    . naproxen (NAPROSYN) 500 MG tablet TK 1 T PO BID WC  3  . pravastatin (PRAVACHOL) 10 MG tablet Take 10 mg by mouth daily.    . QUEtiapine (SEROQUEL) 25 MG tablet TAKE 1 TABLET(25 MG) BY MOUTH AT BEDTIME FOR MOOD OR SLEEP 90 tablet 2  . Vilazodone HCl (VIIBRYD) 20 MG TABS TAKE 1 TABLET(20 MG) BY MOUTH DAILY 90 tablet 1   No current facility-administered medications for this visit.      Musculoskeletal: Strength & Muscle Tone: within normal limits Gait & Station: normal Patient leans: N/A  Psychiatric Specialty Exam: Review of Systems  Psychiatric/Behavioral: The patient is nervous/anxious (on and off).   All other systems reviewed and are negative.   Blood pressure (!) 161/91, pulse 89, temperature 97.6 F (36.4 C), temperature source Oral, weight 268 lb 9.6 oz (121.8 kg).Body mass index is 36.43 kg/m.  General Appearance: Casual  Eye Contact:  Fair  Speech:  Clear and Coherent  Volume:  Normal  Mood:  Euthymic  Affect:  Congruent  Thought Process:  Goal Directed and  Descriptions of Associations: Intact  Orientation:  Full (Time, Place, and Person)  Thought Content: Logical   Suicidal Thoughts:  No  Homicidal Thoughts:  No  Memory:  Immediate;   Fair Recent;   Fair Remote;   Fair  Judgement:  Fair  Insight:  Fair  Psychomotor Activity:  Normal  Concentration:  Concentration: Fair and Attention Span: Fair  Recall:  [employer] of Knowledge: Fair  Language:  Fair  Akathisia:  No  Handed:  Right  AIMS (if indicated): denies tremors, rigidity,stiffness  Assets:  Communication Skills Desire for Improvement Social Support  ADL's:  Intact  Cognition: WNL  Sleep:  Fair   Screenings: PHQ2-9     Procedure visit from 02/25/2018 in [facility] Office Visit from 01/08/2018 in [facility] Office Visit from 07/03/2017 in [facility] Procedure visit from 06/11/2017 in [facility] Office Visit from 04[facility]03/2017 in [facility]  PHQ-2 Total Score  0  0  0  0  0       Assessment and Plan: Kyle Hood is a 62 year old male to male transgender with history of depression, anxiety, presented to the clinic today for a follow-up visit.  She reports doing well on the current medication regimen.  Patient did have some panic attacks however was able to cope with it and declines medication readjustment.  Patient will continue psychotherapy sessions.     Plan For depression Continue Viibryd 20 mg p.o. daily Continue Seroquel as prescribed. PHQ 9 equals 8  For anxiety symptoms Viibryd 20 mg p.o. daily Continue CBT GAD 7 -6  For insomnia Seroquel 12.5-25 mg p.o. nightly as needed  Follow-up in clinic in 2 months or sooner if needed.  More than 50 % of the time was spent for psychoeducation and supportive psychotherapy and care coordination.  This note  was generated in part or whole with voice recognition software. Voice recognition is usually quite accurate but there are transcription errors that can and very often do occur. I apologize for any typographical errors that were not detected and corrected.         Jomarie Longs, MD 03/19/2018, 2:48 PM

## 2018-04-02 ENCOUNTER — Ambulatory Visit
Payer: Medicare Other | Attending: Student in an Organized Health Care Education/Training Program | Admitting: Student in an Organized Health Care Education/Training Program

## 2018-04-02 ENCOUNTER — Other Ambulatory Visit: Payer: Self-pay

## 2018-04-02 ENCOUNTER — Encounter: Payer: Self-pay | Admitting: Student in an Organized Health Care Education/Training Program

## 2018-04-02 VITALS — BP 163/84 | HR 76 | Temp 97.8°F | Resp 18 | Ht 74.0 in | Wt 265.0 lb

## 2018-04-02 DIAGNOSIS — S39012A Strain of muscle, fascia and tendon of lower back, initial encounter: Secondary | ICD-10-CM | POA: Diagnosis not present

## 2018-04-02 DIAGNOSIS — X58XXXA Exposure to other specified factors, initial encounter: Secondary | ICD-10-CM | POA: Insufficient documentation

## 2018-04-02 DIAGNOSIS — F411 Generalized anxiety disorder: Secondary | ICD-10-CM | POA: Diagnosis not present

## 2018-04-02 DIAGNOSIS — M47818 Spondylosis without myelopathy or radiculopathy, sacral and sacrococcygeal region: Secondary | ICD-10-CM

## 2018-04-02 DIAGNOSIS — M5136 Other intervertebral disc degeneration, lumbar region: Secondary | ICD-10-CM

## 2018-04-02 DIAGNOSIS — M47816 Spondylosis without myelopathy or radiculopathy, lumbar region: Secondary | ICD-10-CM | POA: Insufficient documentation

## 2018-04-02 DIAGNOSIS — M1388 Other specified arthritis, other site: Secondary | ICD-10-CM | POA: Insufficient documentation

## 2018-04-02 DIAGNOSIS — R600 Localized edema: Secondary | ICD-10-CM | POA: Diagnosis not present

## 2018-04-02 DIAGNOSIS — G8929 Other chronic pain: Secondary | ICD-10-CM | POA: Diagnosis present

## 2018-04-02 DIAGNOSIS — Z79899 Other long term (current) drug therapy: Secondary | ICD-10-CM | POA: Diagnosis not present

## 2018-04-02 DIAGNOSIS — M542 Cervicalgia: Secondary | ICD-10-CM | POA: Insufficient documentation

## 2018-04-02 DIAGNOSIS — M79642 Pain in left hand: Secondary | ICD-10-CM | POA: Diagnosis not present

## 2018-04-02 DIAGNOSIS — I10 Essential (primary) hypertension: Secondary | ICD-10-CM | POA: Insufficient documentation

## 2018-04-02 DIAGNOSIS — E785 Hyperlipidemia, unspecified: Secondary | ICD-10-CM | POA: Diagnosis not present

## 2018-04-02 DIAGNOSIS — M79605 Pain in left leg: Secondary | ICD-10-CM | POA: Insufficient documentation

## 2018-04-02 DIAGNOSIS — G629 Polyneuropathy, unspecified: Secondary | ICD-10-CM | POA: Insufficient documentation

## 2018-04-02 DIAGNOSIS — Z791 Long term (current) use of non-steroidal anti-inflammatories (NSAID): Secondary | ICD-10-CM | POA: Diagnosis not present

## 2018-04-02 DIAGNOSIS — M5116 Intervertebral disc disorders with radiculopathy, lumbar region: Secondary | ICD-10-CM | POA: Insufficient documentation

## 2018-04-02 DIAGNOSIS — Z8249 Family history of ischemic heart disease and other diseases of the circulatory system: Secondary | ICD-10-CM | POA: Diagnosis not present

## 2018-04-02 DIAGNOSIS — F410 Panic disorder [episodic paroxysmal anxiety] without agoraphobia: Secondary | ICD-10-CM | POA: Diagnosis not present

## 2018-04-02 DIAGNOSIS — F329 Major depressive disorder, single episode, unspecified: Secondary | ICD-10-CM | POA: Diagnosis not present

## 2018-04-02 DIAGNOSIS — M159 Polyosteoarthritis, unspecified: Secondary | ICD-10-CM

## 2018-04-02 DIAGNOSIS — M150 Primary generalized (osteo)arthritis: Secondary | ICD-10-CM | POA: Diagnosis not present

## 2018-04-02 DIAGNOSIS — M51369 Other intervertebral disc degeneration, lumbar region without mention of lumbar back pain or lower extremity pain: Secondary | ICD-10-CM | POA: Insufficient documentation

## 2018-04-02 DIAGNOSIS — Z888 Allergy status to other drugs, medicaments and biological substances status: Secondary | ICD-10-CM | POA: Diagnosis not present

## 2018-04-02 DIAGNOSIS — S39012S Strain of muscle, fascia and tendon of lower back, sequela: Secondary | ICD-10-CM

## 2018-04-02 MED ORDER — KETOROLAC TROMETHAMINE 30 MG/ML IJ SOLN
30.0000 mg | Freq: Once | INTRAMUSCULAR | Status: AC
  Administered 2018-04-02: 30 mg via INTRAMUSCULAR
  Filled 2018-04-02: qty 1

## 2018-04-02 MED ORDER — ORPHENADRINE CITRATE 30 MG/ML IJ SOLN
60.0000 mg | Freq: Once | INTRAMUSCULAR | Status: AC
  Administered 2018-04-02: 60 mg via INTRAMUSCULAR
  Filled 2018-04-02: qty 2

## 2018-04-02 MED ORDER — METHYLPREDNISOLONE 4 MG PO TBPK: ORAL_TABLET | ORAL | 0 refills | Status: AC

## 2018-04-02 NOTE — Progress Notes (Signed)
Patient's Name: Kyle Hood  MRN: [id number]  Referring Provider: Orene Desanctis, MD  DOB: 1956/04/19  PCP: Orene Desanctis, MD  DOS: 04/02/2018  Note by: Edward Jolly, MD  Service setting: Ambulatory outpatient  Specialty: Interventional Pain Management  Location: [facility] (AMB) Pain Management Facility    Patient type: Established   Primary Reason(s) for Visit: Encounter for post-procedure evaluation of chronic illness with mild to moderate exacerbation CC: Back Pain (lower); Neck Pain; Leg Pain (left); and Hand Pain (left)  HPI  Kyle Hood is a 62 y.o. year old, male patient, who comes today for a post-procedure evaluation. He has Hypertension; Lower extremity edema; Hyperlipidemia; Transient loss of consciousness; Cellulitis; Pain in limb; DJD (degenerative joint disease); Anxiety, generalized; Chronic bilateral low back pain; Lumbar polyradiculopathy; Polyneuropathy; Lumbar spondylosis; Lumbar degenerative disc disease; Lumbar facet arthropathy; and Strain of lumbar region on their problem list. His primarily concern today is the Back Pain (lower); Neck Pain; Leg Pain (left); and Hand Pain (left)  Pain Assessment: Location: Lower Back Radiating: left hip Onset: More than a month ago Duration: Chronic pain Quality: Constant, Dull, Throbbing(Pain gets worse as the day goes on, pain is worse with humidity) Severity: 7 /10 (subjective, self-reported pain score)  Note: Reported level is inconsistent with clinical observations. Clinically the patient looks like a 2/10 A 2/10 is viewed as "Mild to Moderate" and described as noticeable and distracting. Impossible to hide from other people. More frequent flare-ups. Still possible to adapt and function close to normal. It can be very annoying and may have occasional stronger flare-ups. With discipline, patients may get used to it and adapt. Information on the proper use of the pain scale provided to the patient today. When using our objective Pain Scale,  levels between 6 and 10/10 are said to belong in an emergency room, as it progressively worsens from a 6/10, described as severely limiting, requiring emergency care not usually available at an outpatient pain management facility. At a 6/10 level, communication becomes difficult and requires great effort. Assistance to reach the emergency department may be required. Facial flushing and profuse sweating along with potentially dangerous increases in heart rate and blood pressure will be evident. Effect on ADL: difficult and painful to bend over Timing: Constant Modifying factors: medications, rest BP: (!) 163/84  HR: 76  Kyle Hood comes in today for post-procedure evaluation.  Further details on both, my assessment(s), as well as the proposed treatment plan, please see below.  Post-Procedure Assessment  02/25/2018 Procedure: Left L3, L4, L5 RFA Pre-procedure pain score:  7/10 Post-procedure pain score: 0/10         Influential Factors: BMI: 34.02 kg/m Intra-procedural challenges: None observed.         Assessment challenges: None detected.              Reported side-effects: None.        Post-procedural adverse reactions or complications: None reported         Sedation: Please see nurses note. When no sedatives are used, the analgesic levels obtained are directly associated to the effectiveness of the local anesthetics. However, when sedation is provided, the level of analgesia obtained during the initial 1 hour following the intervention, is believed to be the result of a combination of factors. These factors may include, but are not limited to: 1. The effectiveness of the local anesthetics used. 2. The effects of the analgesic(s) and/or anxiolytic(s) used. 3. The degree of discomfort experienced by the patient at the time  of the procedure. 4. The patients ability and reliability in recalling and recording the events. 5. The presence and influence of possible secondary gains and/or  psychosocial factors. Reported result: Relief experienced during the 1st hour after the procedure: 100 % (Ultra-Short Term Relief)            Interpretative annotation: Clinically appropriate result. Analgesia during this period is likely to be Local Anesthetic and/or IV Sedative (Analgesic/Anxiolytic) related.          Effects of local anesthetic: The analgesic effects attained during this period are directly associated to the localized infiltration of local anesthetics and therefore cary significant diagnostic value as to the etiological location, or anatomical origin, of the pain. Expected duration of relief is directly dependent on the pharmacodynamics of the local anesthetic used. Long-acting (4-6 hours) anesthetics used.  Reported result: Relief during the next 4 to 6 hour after the procedure: 100 % (Short-Term Relief)            Interpretative annotation: Clinically appropriate result. Analgesia during this period is likely to be Local Anesthetic-related.          Long-term benefit: Defined as the period of time past the expected duration of local anesthetics (1 hour for short-acting and 4-6 hours for long-acting). With the possible exception of prolonged sympathetic blockade from the local anesthetics, benefits during this period are typically attributed to, or associated with, other factors such as analgesic sensory neuropraxia, antiinflammatory effects, or beneficial biochemical changes provided by agents other than the local anesthetics.  Reported result: Extended relief following procedure: 100 %(relief lasted for "3 to 4 days") (Long-Term Relief)            Interpretative annotation: Clinically possible results. Good relief. No permanent benefit expected. Inflammation plays a part in the etiology to the pain.          Current benefits: Defined as reported results that persistent at this point in time.   Analgesia: <50 %            Function: Somewhat improved ROM: Somewhat  improved Interpretative annotation: Recurrence of symptoms. Limited therapeutic benefit. Results would suggest persistent aggravating factors.          Interpretation: Results would suggest therapy to have a limited impact on the patient's condition.                  Plan:  Please see "Plan of Care" for details.                Laboratory Chemistry  Inflammation Markers (CRP: Acute Phase) (ESR: Chronic Phase) Lab Results  Component Value Date   LATICACIDVEN 1.5 12/30/2015                         Rheumatology Markers No results found for: RF, ANA, LABURIC, URICUR, LYMEIGGIGMAB, LYMEABIGMQN, HLAB27                      Renal Function Markers Lab Results  Component Value Date   BUN 17 05/03/2017   CREATININE 0.94 05/03/2017   GFRAA >60 05/03/2017   GFRNONAA >60 05/03/2017                             Hepatic Function Markers Lab Results  Component Value Date   AST 35 05/03/2017   ALT 30 05/03/2017   ALBUMIN 4.3 05/03/2017   ALKPHOS 65  05/03/2017   LIPASE 30 05/03/2017                        Electrolytes Lab Results  Component Value Date   NA 137 05/03/2017   K 3.2 (L) 05/03/2017   CL 102 05/03/2017   CALCIUM 8.8 (L) 05/03/2017                        Neuropathy Markers Lab Results  Component Value Date   HGBA1C 6.1 08/16/2014   HIV Non Reactive 06/18/2016                        CNS Tests No results found for: COLORCSF, APPEARCSF, RBCCOUNTCSF, WBCCSF, POLYSCSF, LYMPHSCSF, EOSCSF, PROTEINCSF, GLUCCSF, JCVIRUS, CSFOLI, IGGCSF                      Bone Pathology Markers No results found for: VD25OH, [id number], [id number], [id number], 25OHVITD1, 25OHVITD2, 25OHVITD3, TESTOFREE, TESTOSTERONE                       Coagulation Parameters Lab Results  Component Value Date   INR 0.96 12/30/2015   LABPROT 12.8 12/30/2015   PLT 258 05/03/2017                        Cardiovascular Markers Lab Results  Component Value Date   TROPONINI <0.03 12/30/2015   HGB 15.1  05/03/2017   HCT 43.2 05/03/2017                         CA Markers No results found for: CEA, CA125, LABCA2                      Note: Lab results reviewed.  Recent Diagnostic Imaging Results  DG C-Arm 1-60 Min-No Report Fluoroscopy was utilized by the requesting physician.  No radiographic  interpretation.   Complexity Note: Imaging results reviewed. Results shared with Kyle Hood, using Layman's terms.                         Meds   Current Outpatient Medications:  .  diclofenac sodium (VOLTAREN) 1 % GEL, Apply topically 4 (four) times daily., Disp: , Rfl:  .  hydrochlorothiazide (HYDRODIURIL) 25 MG tablet, Take 25 mg by mouth daily., Disp: , Rfl:  .  naproxen (NAPROSYN) 500 MG tablet, TK 1 T PO BID WC, Disp: , Rfl: 3 .  pravastatin (PRAVACHOL) 10 MG tablet, Take 10 mg by mouth daily., Disp: , Rfl:  .  QUEtiapine (SEROQUEL) 25 MG tablet, TAKE 1 TABLET(25 MG) BY MOUTH AT BEDTIME FOR MOOD OR SLEEP, Disp: 90 tablet, Rfl: 2 .  Vilazodone HCl (VIIBRYD) 20 MG TABS, TAKE 1 TABLET(20 MG) BY MOUTH DAILY, Disp: 90 tablet, Rfl: 1 .  methylPREDNISolone (MEDROL) 4 MG TBPK tablet, Follow package instructions., Disp: 21 tablet, Rfl: 0  ROS  Constitutional: Denies any fever or chills Gastrointestinal: No reported hemesis, hematochezia, vomiting, or acute GI distress Musculoskeletal: Denies any acute onset joint swelling, redness, loss of ROM, or weakness Neurological: No reported episodes of acute onset apraxia, aphasia, dysarthria, agnosia, amnesia, paralysis, loss of coordination, or loss of consciousness  Allergies  Kyle Hood is allergic to lisinopril.  PFSH  Drug: Kyle Hood  reports that he does not use  drugs. Alcohol:  reports that he does not drink alcohol. Tobacco:  reports that he has never smoked. He has never used smokeless tobacco. Medical:  has a past medical history of Abnormal liver function test, Anxiety, Anxiety, Arthritis, Chronic lower back pain, Degenerative  joint disease, Depression, Fatty liver, Heart murmur, Hepatitis B, Hyperlipidemia, Hypertension, Lumbar polyradiculopathy, Panic attacks, Polyneuropathy, and Vertigo (05/03/2017). Surgical: Kyle Hood  has a past surgical history that includes Prostate biopsy; Colonoscopy; and Colonoscopy with propofol (N/A, 10/01/2017). Family: family history includes Alcohol abuse in his father and mother; Aortic aneurysm in his father; Bipolar disorder in his brother and sister; Cancer in his father and mother; Congestive Heart Failure in his father; Diabetes in his mother; Heart disease in his father; Hypertension in his father and mother; Varicose Veins in his father and mother.  Constitutional Exam  General appearance: Well nourished, well developed, and well hydrated. In no apparent acute distress Vitals:   04/02/18 1417  BP: (!) 163/84  Pulse: 76  Resp: 18  Temp: 97.8 F (36.6 C)  TempSrc: Oral  SpO2: 98%  Weight: 265 lb (120.2 kg)  Height: 6\' 2"  (1.88 m)   BMI Assessment: Estimated body mass index is 34.02 kg/m as calculated from the following:   Height as of this encounter: 6\' 2"  (1.88 m).   Weight as of this encounter: 265 lb (120.2 kg).  BMI interpretation table: BMI level Category Range association with higher incidence of chronic pain  <18 kg/m2 Underweight   18.5-24.9 kg/m2 Ideal body weight   25-29.9 kg/m2 Overweight Increased incidence by 20%  30-34.9 kg/m2 Obese (Class I) Increased incidence by 68%  35-39.9 kg/m2 Severe obesity (Class II) Increased incidence by 136%  >40 kg/m2 Extreme obesity (Class III) Increased incidence by 254%   Patient's current BMI Ideal Body weight  Body mass index is 34.02 kg/m. Ideal body weight: 82.2 kg (181 lb 3.5 oz) Adjusted ideal body weight: 97.4 kg (214 lb 11.7 oz)   BMI Readings from Last 4 Encounters:  04/02/18 34.02 kg/m  02/25/18 35.26 kg/m  01/08/18 35.26 kg/m  10/01/17 33.38 kg/m   Wt Readings from Last 4 Encounters:  04/02/18  265 lb (120.2 kg)  02/25/18 260 lb (117.9 kg)  01/08/18 260 lb (117.9 kg)  10/01/17 260 lb (117.9 kg)  Psych/Mental status: Alert, oriented x 3 (person, place, & time)       Eyes: PERLA Respiratory: No evidence of acute respiratory distress  Cervical Spine Area Exam  Skin & Axial Inspection: No masses, redness, edema, swelling, or associated skin lesions Alignment: Symmetrical Functional ROM: Decreased ROM, bilaterally Stability: No instability detected Muscle Tone/Strength: Functionally intact. No obvious neuro-muscular anomalies detected. Sensory (Neurological): Musculoskeletal pain pattern Palpation: No palpable anomalies              Upper Extremity (UE) Exam    Side: Right upper extremity  Side: Left upper extremity  Skin & Extremity Inspection: Skin color, temperature, and hair growth are WNL. No peripheral edema or cyanosis. No masses, redness, swelling, asymmetry, or associated skin lesions. No contractures.  Skin & Extremity Inspection: Skin color, temperature, and hair growth are WNL. No peripheral edema or cyanosis. No masses, redness, swelling, asymmetry, or associated skin lesions. No contractures.  Functional ROM: Unrestricted ROM          Functional ROM: Unrestricted ROM          Muscle Tone/Strength: Functionally intact. No obvious neuro-muscular anomalies detected.  Muscle Tone/Strength: Functionally intact. No obvious neuro-muscular anomalies  detected.  Sensory (Neurological): Unimpaired          Sensory (Neurological): Unimpaired          Palpation: No palpable anomalies              Palpation: No palpable anomalies              Provocative Test(s):  Phalen's test: deferred Tinel's test: deferred Apley's scratch test (touch opposite shoulder):  Action 1 (Across chest): deferred Action 2 (Overhead): deferred Action 3 (LB reach): deferred   Provocative Test(s):  Phalen's test: deferred Tinel's test: deferred Apley's scratch test (touch opposite shoulder):  Action 1  (Across chest): deferred Action 2 (Overhead): deferred Action 3 (LB reach): deferred    Thoracic Spine Area Exam  Skin & Axial Inspection: No masses, redness, or swelling Alignment: Symmetrical Functional ROM: Decreased ROM Stability: No instability detected Muscle Tone/Strength: Functionally intact. No obvious neuro-muscular anomalies detected. Sensory (Neurological): Musculoskeletal pain pattern Muscle strength & Tone: No palpable anomalies  Lumbar Spine Area Exam  Skin & Axial Inspection: No masses, redness, or swelling Alignment: Symmetrical Functional ROM: Decreased ROM       Stability: No instability detected Muscle Tone/Strength: Functionally intact. No obvious neuro-muscular anomalies detected. Sensory (Neurological): Musculoskeletal pain pattern Palpation: No palpable anomalies       Provocative Tests: Hyperextension/rotation test: (+) bilaterally for facet joint pain. Lumbar quadrant test (Kemp's test): (+) due to pain. Lateral bending test: deferred today       Patrick's Maneuver: deferred today                   FABER test: deferred today                   S-I anterior distraction/compression test: deferred today         S-I lateral compression test: deferred today         S-I Thigh-thrust test: deferred today         S-I Gaenslen's test: deferred today          Gait & Posture Assessment  Ambulation: Unassisted Gait: Relatively normal for age and body habitus Posture: WNL   Lower Extremity Exam    Side: Right lower extremity  Side: Left lower extremity  Stability: No instability observed          Stability: No instability observed          Skin & Extremity Inspection: Skin color, temperature, and hair growth are WNL. No peripheral edema or cyanosis. No masses, redness, swelling, asymmetry, or associated skin lesions. No contractures.  Skin & Extremity Inspection: Skin color, temperature, and hair growth are WNL. No peripheral edema or cyanosis. No masses, redness,  swelling, asymmetry, or associated skin lesions. No contractures.  Functional ROM: Unrestricted ROM                  Functional ROM: Unrestricted ROM                  Muscle Tone/Strength: Functionally intact. No obvious neuro-muscular anomalies detected.  Muscle Tone/Strength: Functionally intact. No obvious neuro-muscular anomalies detected.  Sensory (Neurological): Unimpaired        Sensory (Neurological): Unimpaired        DTR: Patellar: deferred today Achilles: deferred today Plantar: deferred today  DTR: Patellar: deferred today Achilles: deferred today Plantar: deferred today  Palpation: No palpable anomalies  Palpation: No palpable anomalies   Assessment  Primary Diagnosis & Pertinent Problem List: The primary  encounter diagnosis was Lumbar spondylosis. Diagnoses of Lumbar degenerative disc disease, Primary osteoarthritis involving multiple joints, Lumbar facet arthropathy, SI joint arthritis, and Strain of lumbar region, sequela were also pertinent to this visit.  Status Diagnosis  Persistent Persistent Having a Flare-up 1. Lumbar spondylosis   2. Lumbar degenerative disc disease   3. Primary osteoarthritis involving multiple joints   4. Lumbar facet arthropathy   5. SI joint arthritis   6. Strain of lumbar region, sequela     Problems updated and reviewed during this visit: Problem  Lumbar Spondylosis  Lumbar Degenerative Disc Disease  Lumbar Facet Arthropathy  Strain of Lumbar Region   Plan of Care  Pharmacotherapy (Medications Ordered): Meds ordered this encounter  Medications  . orphenadrine (NORFLEX) injection 60 mg  . ketorolac (TORADOL) 30 MG/ML injection 30 mg  . methylPREDNISolone (MEDROL) 4 MG TBPK tablet    Sig: Follow package instructions.    Dispense:  21 tablet    Refill:  0    Do not add to the "Automatic Refill" notification system.   Time Note: Greater than 50% of the 25 minute(s) of face-to-face time spent with Kyle Hood, was spent in  counseling/coordination of care regarding: the appropriate use of the pain scale, Kyle Hood's primary cause of pain, the treatment plan, treatment alternatives, medication side effects, the results, interpretation and significance of  his recent diagnostic interventional treatment(s), realistic expectations and the goals of pain management (increased in functionality).  Provider-requested follow-up: Return in about 6 weeks (around 05/14/2018).  Future Appointments  Date Time Provider Department Center  04/16/2018  2:00 PM Marinda Elk, [place] [facility] None  05/14/2018  2:30 PM Edward Jolly, MD [facility] None    Primary Care Physician: Orene Desanctis, MD Location: [facility] Note by: Edward Jolly, M.D Date: 04/02/2018; Time: 3:12 PM  Patient Instructions  Methylprednisolone has been escribed to your pharmacy.

## 2018-04-02 NOTE — Progress Notes (Signed)
Safety precautions to be maintained throughout the outpatient stay will include: orient to surroundings, keep bed in low position, maintain call bell within reach at all times, provide assistance with transfer out of bed and ambulation.  

## 2018-04-02 NOTE — Patient Instructions (Signed)
Methylprednisolone has been escribed to your pharmacy.

## 2018-04-07 ENCOUNTER — Ambulatory Visit: Payer: Medicare Other | Admitting: Student in an Organized Health Care Education/Training Program

## 2018-04-09 ENCOUNTER — Ambulatory Visit: Payer: Medicare Other | Admitting: Student in an Organized Health Care Education/Training Program

## 2018-04-16 ENCOUNTER — Ambulatory Visit: Payer: Medicare Other | Admitting: Licensed Clinical Social Worker

## 2018-05-07 ENCOUNTER — Ambulatory Visit (INDEPENDENT_AMBULATORY_CARE_PROVIDER_SITE_OTHER): Payer: Medicare Other | Admitting: Licensed Clinical Social Worker

## 2018-05-07 DIAGNOSIS — F330 Major depressive disorder, recurrent, mild: Secondary | ICD-10-CM | POA: Diagnosis not present

## 2018-05-08 ENCOUNTER — Telehealth: Payer: Self-pay | Admitting: *Deleted

## 2018-05-08 NOTE — Progress Notes (Signed)
   THERAPIST PROGRESS NOTE  Session Time: 60 min  Participation Level: Active  Behavioral Response: CasualAlertEuthymic  Type of Therapy: Individual Therapy  Treatment Goals addressed: Coping  Interventions: Motivational Interviewing  Summary: Kyle Hood is a 62 y.o. adult who presents with a reduction in symptoms. Therapist met with Patient in an outpatient setting to assess current mood and assist with making progress towards goals through the use of therapeutic intervention. Therapist did a brief mood check, assessing anger, fear, disgust, excitement, happiness, and sadness.  Patient reports "good" mood the past few days.  Patient recalled mood from years ago to determine if he struggles with colder weather.  Patient reports that he at times feels as if the holidays are a concern for his poor mood.  Discussion of holiday plans.  Discussion of coping skills to reduce triggers to holidays.  Encouraged Patient to create new traditions with his current domestic partner.   Suicidal/Homicidal: No  Plan: Return again in 2 weeks.  Diagnosis: Axis I: Depression    Axis II: No diagnosis     M , LCSW 03/18/2018  

## 2018-05-08 NOTE — Progress Notes (Signed)
   THERAPIST PROGRESS NOTE  Session Time: 60 min  Participation Level: Active  Behavioral Response: CasualAlertEuthymic  Type of Therapy: Individual Therapy  Treatment Goals addressed: Anxiety and Coping  Interventions: Motivational Interviewing  Summary: Kyle Hood is a 63 y.o. adult who presents with a reduction in symptoms.  Patient reports that he attempted some skills that were previously taught in session.  Patient reports his domestic partner's sister was at his home and he attempted to socially interact.  He reports after cleaning the home, preparing hor'deavers and engaging about 20 minutes to went into his room for the remainder of the evening.  Therapist praised Patient for attempting and being open to engaging.  Therapist presented mindfulness and role played how to use.   Suicidal/Homicidal: No  Plan: Return again in 2 weeks.  Diagnosis: Axis I: Social Anxiety    Axis II: No diagnosis    Marinda Elk, [place] 05/08/2018

## 2018-05-13 ENCOUNTER — Ambulatory Visit
Payer: Medicare Other | Attending: Student in an Organized Health Care Education/Training Program | Admitting: Student in an Organized Health Care Education/Training Program

## 2018-05-13 ENCOUNTER — Encounter: Payer: Self-pay | Admitting: Student in an Organized Health Care Education/Training Program

## 2018-05-13 VITALS — BP 161/74 | HR 79 | Temp 98.0°F | Resp 16 | Ht 74.0 in | Wt 260.0 lb

## 2018-05-13 DIAGNOSIS — M5136 Other intervertebral disc degeneration, lumbar region: Secondary | ICD-10-CM | POA: Diagnosis present

## 2018-05-13 DIAGNOSIS — M150 Primary generalized (osteo)arthritis: Secondary | ICD-10-CM | POA: Diagnosis present

## 2018-05-13 DIAGNOSIS — M47816 Spondylosis without myelopathy or radiculopathy, lumbar region: Secondary | ICD-10-CM | POA: Insufficient documentation

## 2018-05-13 DIAGNOSIS — M159 Polyosteoarthritis, unspecified: Secondary | ICD-10-CM

## 2018-05-13 NOTE — Progress Notes (Signed)
Safety precautions to be maintained throughout the outpatient stay will include: orient to surroundings, keep bed in low position, maintain call bell within reach at all times, provide assistance with transfer out of bed and ambulation.  

## 2018-05-13 NOTE — Progress Notes (Signed)
Patient's Name: Gerado Companion  MRN: [id number]  Referring Provider: Orene Desanctis, MD  DOB: 1956/01/14  PCP: Orene Desanctis, MD  DOS: 05/13/2018  Note by: Edward Jolly, MD  Service setting: Ambulatory outpatient  Specialty: Interventional Pain Management  Location: [facility] (AMB) Pain Management Facility    Patient type: Established   Primary Reason(s) for Visit: Evaluation of chronic illnesses with exacerbation, or progression (Level of risk: moderate) CC: Back Pain (lower, left is worse ) and Neck Pain (right)  HPI  Ms. Arbon is a 63 y.o. year old, adult patient, who comes today for a follow-up evaluation. She has Hypertension; Lower extremity edema; Hyperlipidemia; Transient loss of consciousness; Cellulitis; Pain in limb; DJD (degenerative joint disease); Anxiety, generalized; Chronic bilateral low back pain; Lumbar polyradiculopathy; Polyneuropathy; Lumbar spondylosis; Lumbar degenerative disc disease; Lumbar facet arthropathy; and Strain of lumbar region on their problem list. Ms. Wolbers was last seen on 04/02/2018. Her primarily concern today is the Back Pain (lower, left is worse ) and Neck Pain (right)  Pain Assessment: Location: Lower, Left(right) Back(neck) Radiating: back pain down left leg and neck pain down right arm Onset: More than a month ago Duration: Chronic pain Quality: Dull, Discomfort Severity: 5 /10 (subjective, self-reported pain score)  Note: Reported level is inconsistent with clinical observations.                         When using our objective Pain Scale, levels between 6 and 10/10 are said to belong in an emergency room, as it progressively worsens from a 6/10, described as severely limiting, requiring emergency care not usually available at an outpatient pain management facility. At a 6/10 level, communication becomes difficult and requires great effort. Assistance to reach the emergency department may be required. Facial flushing and profuse sweating along with  potentially dangerous increases in heart rate and blood pressure will be evident. Effect on ADL: continues to have difficulty bending and standing at sink to wash dishes  Timing: Intermittent Modifying factors: medications, rest BP: (!) 161/74  HR: 79  Further details on both, my assessment(s), as well as the proposed treatment plan, please see below.  Patient follows up today after his pain exacerbation which he received intramuscular Norflex and Toradol.  Patient states that overall his pain is improved although he continues to have chronic pain.  He is able to perform activities of daily living.  He states that he does have to rest in between.  Laboratory Chemistry  Inflammation Markers (CRP: Acute Phase) (ESR: Chronic Phase) Lab Results  Component Value Date   LATICACIDVEN 1.5 12/30/2015                         Rheumatology Markers No results found for: RF, ANA, LABURIC, URICUR, LYMEIGGIGMAB, LYMEABIGMQN, HLAB27                      Renal Function Markers Lab Results  Component Value Date   BUN 17 05/03/2017   CREATININE 0.94 05/03/2017   GFRAA >60 05/03/2017   GFRNONAA >60 05/03/2017                             Hepatic Function Markers Lab Results  Component Value Date   AST 35 05/03/2017   ALT 30 05/03/2017   ALBUMIN 4.3 05/03/2017   ALKPHOS 65 05/03/2017   LIPASE 30 05/03/2017  Electrolytes Lab Results  Component Value Date   NA 137 05/03/2017   K 3.2 (L) 05/03/2017   CL 102 05/03/2017   CALCIUM 8.8 (L) 05/03/2017                        Neuropathy Markers Lab Results  Component Value Date   HGBA1C 6.1 08/16/2014   HIV Non Reactive 06/18/2016                        CNS Tests No results found for: COLORCSF, APPEARCSF, RBCCOUNTCSF, WBCCSF, POLYSCSF, LYMPHSCSF, EOSCSF, PROTEINCSF, GLUCCSF, JCVIRUS, CSFOLI, IGGCSF                      Bone Pathology Markers No results found for: VD25OH, [id number], [id number], UV2536UY4, 25OHVITD1,  25OHVITD2, 25OHVITD3, TESTOFREE, TESTOSTERONE                       Coagulation Parameters Lab Results  Component Value Date   INR 0.96 12/30/2015   LABPROT 12.8 12/30/2015   PLT 258 05/03/2017                        Cardiovascular Markers Lab Results  Component Value Date   TROPONINI <0.03 12/30/2015   HGB 15.1 05/03/2017   HCT 43.2 05/03/2017                         CA Markers No results found for: CEA, CA125, LABCA2                      Note: Lab results reviewed.  Recent Diagnostic Imaging Review   Lumbosacral Imaging: Lumbar MR wo contrast:  Results for orders placed during the hospital encounter of 09/05/16  MR LUMBAR SPINE WO CONTRAST   Narrative CLINICAL DATA:  Low back and BILATERAL leg pain. Weakness after standing or walking.  EXAM: MRI LUMBAR SPINE WITHOUT CONTRAST  TECHNIQUE: Multiplanar, multisequence MR imaging of the lumbar spine was performed. No intravenous contrast was administered.  COMPARISON:  None.  FINDINGS: Segmentation:  Standard  Alignment:  Physiologic.  Vertebrae: No fracture, evidence of discitis, or aggressive bone lesion. Hemangioma T12.  Conus medullaris: Extends to the L1 level and appears normal.  Paraspinal and other soft tissues: Negative.  Disc levels:  L1-L2:  Normal.  L2-L3:  Normal.  L3-L4:  Annular bulge.  Facet arthropathy.  No impingement.  L4-L5: Facet arthropathy. Far lateral, foraminal, and extraforaminal protrusion on the LEFT. No subarticular zone narrowing. Annular tear could irritate the LEFT L4 nerve root but there is not significant foraminal narrowing. The nerve could be displaced in the extraforaminal space.  L5-S1:  Annular bulge.  Facet arthropathy.  No impingement.  IMPRESSION: There is no spinal stenosis or large disc extrusion contributing to BILATERAL leg pain or lower extremity numbness.  Far-lateral, foraminal, and extraforaminal protrusion on the LEFT. LEFT L4 nerve root  irritation/impingement is possible.   Electronically Signed   By: Elsie Stain M.D.   On: 09/05/2016 14:03      Complexity Note: Imaging results reviewed. Results shared with Ms. [place]ho[place], using Layman's terms.                         Meds   Current Outpatient Medications:  .  diclofenac sodium (VOLTAREN) 1 % GEL, Apply  topically 4 (four) times daily., Disp: , Rfl:  .  hydrochlorothiazide (HYDRODIURIL) 25 MG tablet, Take 25 mg by mouth daily., Disp: , Rfl:  .  naproxen (NAPROSYN) 500 MG tablet, TK 1 T PO BID WC, Disp: , Rfl: 3 .  pravastatin (PRAVACHOL) 10 MG tablet, Take 10 mg by mouth daily., Disp: , Rfl:  .  QUEtiapine (SEROQUEL) 25 MG tablet, TAKE 1 TABLET(25 MG) BY MOUTH AT BEDTIME FOR MOOD OR SLEEP, Disp: 90 tablet, Rfl: 2 .  Vilazodone HCl (VIIBRYD) 20 MG TABS, TAKE 1 TABLET(20 MG) BY MOUTH DAILY, Disp: 90 tablet, Rfl: 1  ROS  Constitutional: Denies any fever or chills Gastrointestinal: No reported hemesis, hematochezia, vomiting, or acute GI distress Musculoskeletal: Denies any acute onset joint swelling, redness, loss of ROM, or weakness Neurological: No reported episodes of acute onset apraxia, aphasia, dysarthria, agnosia, amnesia, paralysis, loss of coordination, or loss of consciousness  Allergies  Ms. Recupero is allergic to lisinopril.  PFSH  Drug: Ms. Tisby  reports no history of drug use. Alcohol:  reports no history of alcohol use. Tobacco:  reports that she has never smoked. She has never used smokeless tobacco. Medical:  has a past medical history of Abnormal liver function test, Anxiety, Anxiety, Arthritis, Chronic lower back pain, Degenerative joint disease, Depression, Fatty liver, Heart murmur, Hepatitis B, Hyperlipidemia, Hypertension, Lumbar polyradiculopathy, Panic attacks, Polyneuropathy, and Vertigo (05/03/2017). Surgical: Ms. Frederique  has a past surgical history that includes Prostate biopsy; Colonoscopy; and Colonoscopy with propofol (N/A,  10/01/2017). Family: family history includes Alcohol abuse in her father and mother; Aortic aneurysm in her father; Bipolar disorder in her brother and sister; Cancer in her father and mother; Congestive Heart Failure in her father; Diabetes in her mother; Heart disease in her father; Hypertension in her father and mother; Varicose Veins in her father and mother.  Constitutional Exam  General appearance: Well nourished, well developed, and well hydrated. In no apparent acute distress Vitals:   05/13/18 1300  BP: (!) 161/74  Pulse: 79  Resp: 16  Temp: 98 F (36.7 C)  TempSrc: Oral  SpO2: 97%  Weight: 260 lb (117.9 kg)  Height: 6\' 2"  (1.88 m)   BMI Assessment: Estimated body mass index is 33.38 kg/m as calculated from the following:   Height as of this encounter: 6\' 2"  (1.88 m).   Weight as of this encounter: 260 lb (117.9 kg).  BMI interpretation table: BMI level Category Range association with higher incidence of chronic pain  <18 kg/m2 Underweight   18.5-24.9 kg/m2 Ideal body weight   25-29.9 kg/m2 Overweight Increased incidence by 20%  30-34.9 kg/m2 Obese (Class I) Increased incidence by 68%  35-39.9 kg/m2 Severe obesity (Class II) Increased incidence by 136%  >40 kg/m2 Extreme obesity (Class III) Increased incidence by 254%   Patient's current BMI Ideal Body weight  Body mass index is 33.38 kg/m. Ideal body weight: 82.2 kg (181 lb 3.5 oz) Adjusted ideal body weight: 96.5 kg (212 lb 11.7 oz)   BMI Readings from Last 4 Encounters:  05/13/18 33.38 kg/m  04/02/18 34.02 kg/m  02/25/18 35.26 kg/m  01/08/18 35.26 kg/m   Wt Readings from Last 4 Encounters:  05/13/18 260 lb (117.9 kg)  04/02/18 265 lb (120.2 kg)  02/25/18 260 lb (117.9 kg)  01/08/18 260 lb (117.9 kg)  Psych/Mental status: Alert, oriented x 3 (person, place, & time)       Eyes: PERLA Respiratory: No evidence of acute respiratory distress  Cervical Spine Area Exam  Skin & Axial Inspection: No masses,  redness, edema, swelling, or associated skin lesions Alignment: Symmetrical Functional ROM: Unrestricted ROM      Stability: No instability detected Muscle Tone/Strength: Functionally intact. No obvious neuro-muscular anomalies detected. Sensory (Neurological): Unimpaired Palpation: No palpable anomalies              Upper Extremity (UE) Exam    Side: Right upper extremity  Side: Left upper extremity  Skin & Extremity Inspection: Skin color, temperature, and hair growth are WNL. No peripheral edema or cyanosis. No masses, redness, swelling, asymmetry, or associated skin lesions. No contractures.  Skin & Extremity Inspection: Skin color, temperature, and hair growth are WNL. No peripheral edema or cyanosis. No masses, redness, swelling, asymmetry, or associated skin lesions. No contractures.  Functional ROM: Unrestricted ROM          Functional ROM: Unrestricted ROM          Muscle Tone/Strength: Functionally intact. No obvious neuro-muscular anomalies detected.  Muscle Tone/Strength: Functionally intact. No obvious neuro-muscular anomalies detected.  Sensory (Neurological): Unimpaired          Sensory (Neurological): Unimpaired          Palpation: No palpable anomalies              Palpation: No palpable anomalies              Provocative Test(s):  Phalen's test: deferred Tinel's test: deferred Apley's scratch test (touch opposite shoulder):  Action 1 (Across chest): deferred Action 2 (Overhead): deferred Action 3 (LB reach): deferred   Provocative Test(s):  Phalen's test: deferred Tinel's test: deferred Apley's scratch test (touch opposite shoulder):  Action 1 (Across chest): deferred Action 2 (Overhead): deferred Action 3 (LB reach): deferred    Thoracic Spine Area Exam  Skin & Axial Inspection: No masses, redness, or swelling Alignment: Symmetrical Functional ROM: Unrestricted ROM Stability: No instability detected Muscle Tone/Strength: Functionally intact. No obvious  neuro-muscular anomalies detected. Sensory (Neurological): Unimpaired Muscle strength & Tone: No palpable anomalies  Lumbar Spine Area Exam  Skin & Axial Inspection: No masses, redness, or swelling Alignment: Symmetrical Functional ROM: Diminished ROM       Stability: No instability detected Muscle Tone/Strength: Functionally intact. No obvious neuro-muscular anomalies detected. Sensory (Neurological): Musculoskeletal pain pattern Palpation: No palpable anomalies       Provocative Tests: Hyperextension/rotation test: (+) bilaterally for facet joint pain. Lumbar quadrant test (Kemp's test): (+) due to pain. Lateral bending test: deferred today       Patrick's Maneuver: deferred today                   FABER* test: deferred today                   S-I anterior distraction/compression test: deferred today         S-I lateral compression test: deferred today         S-I Thigh-thrust test: deferred today         S-I Gaenslen's test: deferred today         *(Flexion, ABduction and External Rotation)  Gait & Posture Assessment  Ambulation: Unassisted Gait: Relatively normal for age and body habitus Posture: WNL   Lower Extremity Exam    Side: Right lower extremity  Side: Left lower extremity  Stability: No instability observed          Stability: No instability observed          Skin & Extremity Inspection:  Skin color, temperature, and hair growth are WNL. No peripheral edema or cyanosis. No masses, redness, swelling, asymmetry, or associated skin lesions. No contractures.  Skin & Extremity Inspection: Skin color, temperature, and hair growth are WNL. No peripheral edema or cyanosis. No masses, redness, swelling, asymmetry, or associated skin lesions. No contractures.  Functional ROM: Decreased ROM for hip and knee joints          Functional ROM: Decreased ROM for hip joint          Muscle Tone/Strength: Functionally intact. No obvious neuro-muscular anomalies detected.  Muscle  Tone/Strength: Functionally intact. No obvious neuro-muscular anomalies detected.  Sensory (Neurological): Arthropathic arthralgia        Sensory (Neurological): Arthropathic arthralgia        DTR: Patellar: deferred today Achilles: deferred today Plantar: deferred today  DTR: Patellar: deferred today Achilles: deferred today Plantar: deferred today  Palpation: No palpable anomalies  Palpation: No palpable anomalies   Assessment  Primary Diagnosis & Pertinent Problem List: The primary encounter diagnosis was Lumbar spondylosis. Diagnoses of Lumbar degenerative disc disease and Primary osteoarthritis involving multiple joints were also pertinent to this visit.  Status Diagnosis  Controlled Controlled Controlled 1. Lumbar spondylosis   2. Lumbar degenerative disc disease   3. Primary osteoarthritis involving multiple joints      64 year old male with a history of lumbar spondylosis status post lumbar facet radiofrequency ablation at left L3, L4, L5 on 02/25/2018 and the right side done on 06/11/2017.  Patient follows up today status post his pain flare for which she received Norflex and Toradol injection.  Patient was also prescribed steroid taper however he failed to get it filled stating that side effects of mania and insomnia were scary to him and he did not want to subject himself to that.  I had extensive discussion with the patient today regarding his treatment plan.  He states that globally since his radiofrequency ablations, his overall back pain is improved although he continues to have positions that exacerbate his pain.  He is able to perform ADLs but has to rest in between.  We can repeat radiofrequency ablation of the lumbar medial branch nerves  6 months after his previous one.  Patient endorsed understanding.  I informed the patient to notify us if and when he has return of his axial low back pain to the point that he wants to have radiofrequency ablation repeated.  Time Note:  Greater than 50% of the 15 minute(s) of face-to-face time spent with Ms. Pope, was spent in counseling/coordination of care regarding: Ms. Hubner's primary cause of pain, the treatment plan, treatment alternatives, the results, interpretation and significance of  her recent diagnostic interventional treatment(s), realistic expectations and the goals of pain management (increased in functionality).  PRN Procedures:   To be determined at a later time   Provider-requested follow-up: Return if symptoms worsen or fail to improve.  Future Appointments  Date Time Provider Department Center  06/03/2018  2:30 PM Jomarie Longs, MD [facility] None  06/11/2018  2:30 PM Jomarie Longs, MD [facility] None  06/11/2018  3:00 PM Marinda Elk, LCSW [facility] None    Primary Care Physician: Orene Desanctis, MD Location: [facility] Note by: Edward Jolly, M.D Date: 05/13/2018; Time: 3:09 PM  There are no Patient Instructions on file for this visit.

## 2018-05-14 ENCOUNTER — Ambulatory Visit: Payer: Medicare Other | Admitting: Student in an Organized Health Care Education/Training Program

## 2018-05-21 ENCOUNTER — Other Ambulatory Visit: Payer: Self-pay | Admitting: Family Medicine

## 2018-05-28 ENCOUNTER — Other Ambulatory Visit: Payer: Self-pay | Admitting: Family Medicine

## 2018-05-28 DIAGNOSIS — R1031 Right lower quadrant pain: Secondary | ICD-10-CM

## 2018-06-03 ENCOUNTER — Ambulatory Visit: Payer: Medicare Other | Admitting: Psychiatry

## 2018-06-04 ENCOUNTER — Ambulatory Visit: Payer: Medicare Other

## 2018-06-06 ENCOUNTER — Other Ambulatory Visit: Payer: Self-pay | Admitting: Psychiatry

## 2018-06-10 ENCOUNTER — Ambulatory Visit: Payer: Medicare Other

## 2018-06-11 ENCOUNTER — Encounter: Payer: Self-pay | Admitting: Psychiatry

## 2018-06-11 ENCOUNTER — Other Ambulatory Visit: Payer: Self-pay

## 2018-06-11 ENCOUNTER — Ambulatory Visit (INDEPENDENT_AMBULATORY_CARE_PROVIDER_SITE_OTHER): Payer: Medicare Other | Admitting: Licensed Clinical Social Worker

## 2018-06-11 ENCOUNTER — Ambulatory Visit (INDEPENDENT_AMBULATORY_CARE_PROVIDER_SITE_OTHER): Payer: Medicare Other | Admitting: Psychiatry

## 2018-06-11 VITALS — BP 158/82 | HR 87 | Temp 98.0°F | Wt 273.0 lb

## 2018-06-11 DIAGNOSIS — F3341 Major depressive disorder, recurrent, in partial remission: Secondary | ICD-10-CM

## 2018-06-11 DIAGNOSIS — F640 Transsexualism: Secondary | ICD-10-CM

## 2018-06-11 DIAGNOSIS — F410 Panic disorder [episodic paroxysmal anxiety] without agoraphobia: Secondary | ICD-10-CM | POA: Diagnosis not present

## 2018-06-11 DIAGNOSIS — F4010 Social phobia, unspecified: Secondary | ICD-10-CM

## 2018-06-11 DIAGNOSIS — R030 Elevated blood-pressure reading, without diagnosis of hypertension: Secondary | ICD-10-CM

## 2018-06-11 NOTE — Progress Notes (Signed)
BH MD OP Progress Note  06/11/2018 5:45 PM Kyle Hood  MRN:  [id number]  Chief Complaint: ' I am here for follow up." Chief Complaint    Follow-up     HPI: Kyle Hood is a 63 year old transgender male to male, lives in [place], has a history of depression, panic disorder, social anxiety disorder, presented to clinic today for a follow-up visit.  Patient today reports they are making progress on the current medication regimen.  Denies any significant depressive symptoms.  Patient continues to struggle with some social anxiety symptoms.  Patient provided supportive psychotherapy.  Patient to continue to work with Ms. Peacock.  Patient with elevated blood pressure today discussed follow-up with primary medical doctor.  Patient denies suicidality, perceptual disturbances or homicidality.  Patient denies any other concerns today. Visit Diagnosis:    ICD-10-CM   1. MDD (major depressive disorder), recurrent, in partial remission (HCC) F33.41   2. Social anxiety disorder F40.10    improving  3. Panic disorder F41.0   4. 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 F64.0   5. Elevated blood pressure reading R03.0     Past Psychiatric History: I have reviewed past psychiatric history from my progress note on 06/05/2017.  Past trials of Zoloft, Wellbutrin  Past Medical History:  Past Medical History:  Diagnosis Date  . Abnormal liver function test   . Anxiety   . Anxiety   . Arthritis   . Chronic lower back pain   . Degenerative joint disease   . Depression   . Fatty liver   . Heart murmur   . Hepatitis B   . Hyperlipidemia   . Hypertension   . Lumbar polyradiculopathy   . Panic attacks   . Polyneuropathy   . Vertigo 05/03/2017   ER    Past Surgical History:  Procedure Laterality Date  . COLONOSCOPY    . COLONOSCOPY WITH PROPOFOL N/A 10/01/2017   Procedure: COLONOSCOPY WITH PROPOFOL;  Surgeon: Toledo, [name], MD;  Location: [facility] ENDOSCOPY;  Service: Endoscopy;  Laterality: N/A;  .  PROSTATE BIOPSY      Family Psychiatric History: Reviewed family psychiatric history from my progress note on 06/05/2017.  Family History:  Family History  Problem Relation Age of Onset  . Cancer Mother        Pancreatic  . Diabetes Mother   . Hypertension Mother   . Varicose Veins Mother   . Alcohol abuse Mother   . Heart disease Father        CABG  . Cancer Father        Jaw  . Aortic aneurysm Father   . Congestive Heart Failure Father   . Hypertension Father   . Varicose Veins Father   . Alcohol abuse Father   . Bipolar disorder Sister   . Bipolar disorder Brother     Social History: Reviewed social history from my progress note on 06/05/2017. Social History   Socioeconomic History  . Marital status: Significant Other    Spouse name: denise  . Number of children: 0  . Years of education: Not on file  . Highest education level: 11th grade  Occupational History    Comment: disabled  Social Needs  . Financial resource strain: Very hard  . Food insecurity:    Worry: Never true    Inability: Never true  . Transportation needs:    Medical: No    Non-medical: No  Tobacco Use  . Smoking status: Never Smoker  . Smokeless tobacco: Never Used  Substance and Sexual Activity  . Alcohol use: No    Frequency: Never  . Drug use: No  . Sexual activity: Not Currently  Lifestyle  . Physical activity:    Days per week: 0 days    Minutes per session: 0 min  . Stress: Not on file  Relationships  . Social connections:    Talks on phone: Twice a week    Gets together: Never    Attends religious service: Never    Active member of club or organization: No    Attends meetings of clubs or organizations: Never    Relationship status: Living with partner  Other Topics Concern  . Not on file  Social History Narrative  . Not on file    Allergies:  Allergies  Allergen Reactions  . Lisinopril Cough    Metabolic Disorder Labs: Lab Results  Component Value Date   HGBA1C  6.1 08/16/2014   No results found for: PROLACTIN Lab Results  Component Value Date   CHOL 145 08/16/2014   TRIG 320 (A) 08/16/2014   HDL 36 08/16/2014   LDLCALC 45 08/16/2014   Lab Results  Component Value Date   TSH 1.61 08/16/2014    Therapeutic Level Labs: No results found for: LITHIUM No results found for: VALPROATE No components found for:  CBMZ  Current Medications: Current Outpatient Medications  Medication Sig Dispense Refill  . diclofenac sodium (VOLTAREN) 1 % GEL Apply topically 4 (four) times daily.    . hydrochlorothiazide (HYDRODIURIL) 25 MG tablet Take 25 mg by mouth daily.    . naproxen (NAPROSYN) 500 MG tablet TK 1 T PO BID WC  3  . pravastatin (PRAVACHOL) 10 MG tablet Take 10 mg by mouth daily.    . QUEtiapine (SEROQUEL) 25 MG tablet TAKE 1 TABLET(25 MG) BY MOUTH AT BEDTIME FOR MOOD OR SLEEP 90 tablet 2  . VIIBRYD 20 MG TABS TAKE 1 TABLET(20 MG) BY MOUTH DAILY 90 tablet 1   No current facility-administered medications for this visit.      Musculoskeletal: Strength & Muscle Tone: within normal limits Gait & Station: normal Patient leans: N/A  Psychiatric Specialty Exam: Review of Systems  Psychiatric/Behavioral: Negative for depression. The patient is not nervous/anxious.   All other systems reviewed and are negative.   Blood pressure (!) 158/82, pulse 87, temperature 98 F (36.7 C), temperature source Oral, weight 273 lb (123.8 kg).Body mass index is 35.05 kg/m.  General Appearance: Casual  Eye Contact:  Fair  Speech:  Clear and Coherent  Volume:  Normal  Mood:  Euthymic  Affect:  Appropriate  Thought Process:  Goal Directed and Descriptions of Associations: Intact  Orientation:  Full (Time, Place, and Person)  Thought Content: Logical   Suicidal Thoughts:  No  Homicidal Thoughts:  No  Memory:  Immediate;   Fair Recent;   Fair Remote;   Fair  Judgement:  Fair  Insight:  Fair  Psychomotor Activity:  Normal  Concentration:  Concentration:  Fair and Attention Span: Fair  Recall:  [employer] of Knowledge: Fair  Language: Fair  Akathisia:  No  Handed:  Right  AIMS (if indicated): denies tremors, rigidity,stiffness  Assets:  Communication Skills Desire for Improvement Social Support  ADL's:  Intact  Cognition: WNL  Sleep:  Fair   Screenings: PHQ2-9     Office Visit from 04/02/2018 in [facility] Procedure visit from 02/25/2018 in [facility] Office Visit from  01/08/2018 in [facility] Office Visit from 07[facility]03/2017 in [facility] Procedure visit from 06/11/2017 in [facility]  PHQ-2 Total Score  0  0  0  0  0       Assessment and Plan: Kyle Hood is a 63 year old male to male transgender with history of depression, anxiety, presented to clinic today for a follow-up visit.  Patient reports doing well on the current medication regimen.  Will continue psychotherapy sessions.  Plan as noted below.  Plan For depression-stable Viibryd 20 mg p.o. daily Seroquel as prescribed.  For anxiety symptoms-improving Viibryd 20 mg p.o. daily Continue CBT  For insomnia- stable Seroquel 12.5 to 25 mg p.o. nightly as needed  Gender dysphoria-chronic Continue psychotherapy  Patient to continue psychotherapy sessions. Follow-up in clinic in 2 to 3 months or sooner if needed.  Pt with elevated blood pressure reading today-discussed to reach out to her primary medical doctor.  I have spent atleast 15 minutes face to face with patient today. More than 50 % of the time was spent for psychoeducation and supportive psychotherapy and care coordination.  This note was generated in part or whole with voice recognition software. Voice recognition is usually quite accurate but there are transcription errors that can and very often do  occur. I apologize for any typographical errors that were not detected and corrected.        Jomarie Longs, MD 06/11/2018, 5:45 PM

## 2018-06-15 ENCOUNTER — Telehealth: Payer: Self-pay

## 2018-06-15 NOTE — Telephone Encounter (Signed)
pt called left message that he needed to speak with dr. Elna Breslow. and needs refills on mediations.

## 2018-06-16 ENCOUNTER — Telehealth: Payer: Self-pay | Admitting: Psychiatry

## 2018-06-16 ENCOUNTER — Other Ambulatory Visit: Payer: Self-pay | Admitting: Family Medicine

## 2018-06-16 DIAGNOSIS — R1031 Right lower quadrant pain: Secondary | ICD-10-CM

## 2018-06-16 DIAGNOSIS — F411 Generalized anxiety disorder: Secondary | ICD-10-CM

## 2018-06-16 MED ORDER — HYDROXYZINE HCL 25 MG PO TABS: 12.5000 mg | ORAL_TABLET | Freq: Three times a day (TID) | ORAL | 1 refills | Status: DC | PRN
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
## 2018-06-16 NOTE — Telephone Encounter (Signed)
Attempted to reach patient - no response. No voicemail set up to leave message.

## 2018-06-16 NOTE — Telephone Encounter (Signed)
pt called spoke with lea. stated he needs something to take to help him sleep.

## 2018-07-01 ENCOUNTER — Ambulatory Visit: Payer: Medicare Other

## 2018-07-08 ENCOUNTER — Ambulatory Visit: Payer: Medicare Other

## 2018-07-15 ENCOUNTER — Ambulatory Visit (INDEPENDENT_AMBULATORY_CARE_PROVIDER_SITE_OTHER): Payer: Medicare Other | Admitting: Licensed Clinical Social Worker

## 2018-07-15 ENCOUNTER — Other Ambulatory Visit: Payer: Self-pay

## 2018-07-15 DIAGNOSIS — F411 Generalized anxiety disorder: Secondary | ICD-10-CM | POA: Diagnosis not present

## 2018-07-15 DIAGNOSIS — F3341 Major depressive disorder, recurrent, in partial remission: Secondary | ICD-10-CM | POA: Diagnosis not present

## 2018-08-25 ENCOUNTER — Other Ambulatory Visit: Payer: Self-pay

## 2018-08-25 ENCOUNTER — Ambulatory Visit (INDEPENDENT_AMBULATORY_CARE_PROVIDER_SITE_OTHER): Payer: Medicare Other | Admitting: Licensed Clinical Social Worker

## 2018-08-25 DIAGNOSIS — F3341 Major depressive disorder, recurrent, in partial remission: Secondary | ICD-10-CM | POA: Diagnosis not present

## 2018-09-06 NOTE — Progress Notes (Signed)
   THERAPIST PROGRESS NOTE  Session Time: 60 min  Participation Level: Active  Behavioral Response: CasualAlertEuthymic  Type of Therapy: Individual Therapy  Treatment Goals addressed: Anxiety and Coping  Interventions: Motivational Interviewing  Summary: Kyle Hood is a 62 y.o. adult who presents with a reduction in symptoms. Therapist met with Patient in an outpatient setting to assess current mood and assist with making progress towards goals through the use of therapeutic intervention. Therapist did a brief mood check, assessing anger, fear, disgust, excitement, happiness, and sadness Patient reports "good, positive" mood.  Therapist discussed how to continue with mood and reduce frequency of symptoms.  Encouraged Patient to use coping skills previouly taught.   Suicidal/Homicidal: No  Plan: Return again in 2 weeks.  Diagnosis: Axis I: Generalized Anxiety Disorder    Axis II: No diagnosis    Lubertha South, LCSW 06/11/2018

## 2018-09-07 ENCOUNTER — Telehealth: Payer: Self-pay | Admitting: Student in an Organized Health Care Education/Training Program

## 2018-09-07 NOTE — Telephone Encounter (Signed)
Left message for patient, that I do not see where Dr Cherylann Ratel has prescribed this for him before.  If he is out he will need to call the provider that gave it last.  If there are further questions please call.

## 2018-09-07 NOTE — Progress Notes (Signed)
   THERAPIST PROGRESS NOTE  Session Time: 60 min  Participation Level: Active  Behavioral Response: CasualAlertEuthymic  Type of Therapy: Individual Therapy  Treatment Goals addressed: Anxiety and Coping  Interventions: Motivational Interviewing  Summary: Colbie Odoms is a 63 y.o. adult who presents with a reduction in symptoms. Reviewed stressors and educated patient on depression and its symptoms.  Suicidal/Homicidal: No  Plan: Return again in 2 weeks.  Diagnosis: Axis I: Generalized Anxiety Disorder    Axis II: No diagnosis    Marinda Elk, LCSW 07/15/2018

## 2018-09-07 NOTE — Telephone Encounter (Signed)
Pt called and was being very rude bc he doesn't have an refills on his naproxen and stated that he doesn't need to have an appt just to get this filled bc it is not a controlled substance. I told pt that he hasn't been seen since January and may need an appt that I would send a message back to the nurses.

## 2018-09-18 NOTE — Progress Notes (Signed)
Virtual Visit via Telephone Note  I connected with Kyle Hood on 08/25/18 at  9:00 AM EDT by telephone and verified that I am speaking with the correct person using two identifiers.  Location: Patient: home Provider: office   I discussed the limitations, risks, security and privacy concerns of performing an evaluation and management service by telephone and the availability of in person appointments. I also discussed with the patient that there may be a patient responsible charge related to this service. The patient expressed understanding and agreed to proceed.  Session Time: 30  Participation Level: Active  Type of Therapy: Individual Therapy  Treatment Goals addressed: Anxiety and Coping  Interventions: CBT and Motivational Interviewing  Summary: Kyle Hood is a 63 y.o. adult who presents with continued symptoms of diagnosis.  Therapist asked client open-ended questions to assist with gathering information to determine whether or not the client has made any progress on the goal listed above. Therapist questioned the client to assess whether or not he could identify and verbalize how often per week he works on increasing coping skills to reduce symptoms and expressing her feelings, thoughts, feelings and emotions as related to his Major Depression.   Suicidal/Homicidal: No  Plan: Return again in 2 weeks.  Diagnosis: Axis I: Depressive Disorder NOS and Generalized Anxiety Disorder    Axis II: No diagnosis    I discussed the assessment and treatment plan with the patient. The patient was provided an opportunity to ask questions and all were answered. The patient agreed with the plan and demonstrated an understanding of the instructions.   The patient was advised to call back or seek an in-person evaluation if the symptoms worsen or if the condition fails to improve as anticipated.  I provided 30 minutes of non-face-to-face time during this encounter.   Marinda Elk,  LCSW

## 2018-09-24 ENCOUNTER — Other Ambulatory Visit: Payer: Self-pay

## 2018-09-24 ENCOUNTER — Ambulatory Visit (INDEPENDENT_AMBULATORY_CARE_PROVIDER_SITE_OTHER): Payer: Medicare Other | Admitting: Psychiatry

## 2018-09-24 ENCOUNTER — Ambulatory Visit (INDEPENDENT_AMBULATORY_CARE_PROVIDER_SITE_OTHER): Payer: Medicare Other | Admitting: Licensed Clinical Social Worker

## 2018-09-24 ENCOUNTER — Encounter: Payer: Self-pay | Admitting: Psychiatry

## 2018-09-24 DIAGNOSIS — F3341 Major depressive disorder, recurrent, in partial remission: Secondary | ICD-10-CM

## 2018-09-24 DIAGNOSIS — F410 Panic disorder [episodic paroxysmal anxiety] without agoraphobia: Secondary | ICD-10-CM | POA: Diagnosis not present

## 2018-09-24 DIAGNOSIS — F640 Transsexualism: Secondary | ICD-10-CM

## 2018-09-24 DIAGNOSIS — F4010 Social phobia, unspecified: Secondary | ICD-10-CM

## 2018-09-24 MED ORDER — HYDROXYZINE HCL 25 MG PO TABS
12.5000 mg | ORAL_TABLET | Freq: Three times a day (TID) | ORAL | 1 refills | Status: DC | PRN
Start: 2018-09-24 — End: 2019-05-28

## 2018-09-24 MED ORDER — VILAZODONE HCL 20 MG PO TABS: ORAL_TABLET | ORAL | 1 refills | Status: DC

## 2018-09-24 NOTE — Progress Notes (Signed)
Virtual Visit via Telephone Note  I connected with Kyle Hood on 09/24/18 at  4:15 PM EDT by telephone and verified that I am speaking with the correct person using two identifiers.   I discussed the limitations, risks, security and privacy concerns of performing an evaluation and management service by telephone and the availability of in person appointments. I also discussed with the patient that there may be a patient responsible charge related to this service. The patient expressed understanding and agreed to proceed.   I discussed the assessment and treatment plan with the patient. The patient was provided an opportunity to ask questions and all were answered. The patient agreed with the plan and demonstrated an understanding of the instructions.   The patient was advised to call back or seek an in-person evaluation if the symptoms worsen or if the condition fails to improve as anticipated.   BH MD OP Progress Note  09/24/2018 5:04 PM Kyle Hood  MRN:  [id number]  Chief Complaint:  Chief Complaint    Follow-up     HPI: Kyle Hood is a 62 year old transgender male to male, lives in [place], has a history of depression panic disorder, social anxiety disorder was evaluated by phone today.  Patient was offered video consult however declined.  Patient today reports they are making progress on the current medication regimen.  Patient does report anxiety about the current situation however has been coping okay.  Patient continues to work with the therapist Ms. Kerby Nora which is going well.  Patient is compliant on medications as prescribed.  Hydroxyzine as needed does help for anxiety attacks.  Patient denies any suicidality, homicidality or perceptual disturbances.  Patient denies any other concerns today. Visit Diagnosis:    ICD-10-CM   1. MDD (major depressive disorder), recurrent, in partial remission (HCC) F33.41 Vilazodone HCl (VIIBRYD) 20 MG TABS  2. Social anxiety disorder  F40.10 Vilazodone HCl (VIIBRYD) 20 MG TABS  3. Panic disorder F41.0 hydrOXYzine (ATARAX/VISTARIL) 25 MG tablet  4. 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 F64.0     Past Psychiatric History: I have reviewed past psychiatric history from my progress note on 06/05/2017.  Past trials of Zoloft, Wellbutrin.  Past Medical History:  Past Medical History:  Diagnosis Date  . Abnormal liver function test   . Anxiety   . Anxiety   . Arthritis   . Chronic lower back pain   . Degenerative joint disease   . Depression   . Fatty liver   . Heart murmur   . Hepatitis B   . Hyperlipidemia   . Hypertension   . Lumbar polyradiculopathy   . Panic attacks   . Polyneuropathy   . Vertigo 05/03/2017   ER    Past Surgical History:  Procedure Laterality Date  . COLONOSCOPY    . COLONOSCOPY WITH PROPOFOL N/A 10/01/2017   Procedure: COLONOSCOPY WITH PROPOFOL;  Surgeon: Toledo, Boykin Nearing, MD;  Location: [facility] ENDOSCOPY;  Service: Endoscopy;  Laterality: N/A;  . PROSTATE BIOPSY      Family Psychiatric History: Reviewed family psychiatric history from my progress note on 06/05/2017.  Family History:  Family History  Problem Relation Age of Onset  . Cancer Mother        Pancreatic  . Diabetes Mother   . Hypertension Mother   . Varicose Veins Mother   . Alcohol abuse Mother   . Heart disease Father        CABG  . Cancer Father        Jaw  .  Aortic aneurysm Father   . Congestive Heart Failure Father   . Hypertension Father   . Varicose Veins Father   . Alcohol abuse Father   . Bipolar disorder Sister   . Bipolar disorder Brother     Social History: I have reviewed social history from my progress note on 06/05/2017. Social History   Socioeconomic History  . Marital status: Significant Other    Spouse name: denise  . Number of children: 0  . Years of education: Not on file  . Highest education level: 11th grade  Occupational History    Comment: disabled  Social Needs  . Financial resource strain: Very  hard  . Food insecurity:    Worry: Never true    Inability: Never true  . Transportation needs:    Medical: No    Non-medical: No  Tobacco Use  . Smoking status: Never Smoker  . Smokeless tobacco: Never Used  Substance and Sexual Activity  . Alcohol use: No    Frequency: Never  . Drug use: No  . Sexual activity: Not Currently  Lifestyle  . Physical activity:    Days per week: 0 days    Minutes per session: 0 min  . Stress: Not on file  Relationships  . Social connections:    Talks on phone: Twice a week    Gets together: Never    Attends religious service: Never    Active member of club or organization: No    Attends meetings of clubs or organizations: Never    Relationship status: Living with partner  Other Topics Concern  . Not on file  Social History Narrative  . Not on file    Allergies:  Allergies  Allergen Reactions  . Lisinopril Cough    Metabolic Disorder Labs: Lab Results  Component Value Date   HGBA1C 6.1 08/16/2014   No results found for: PROLACTIN Lab Results  Component Value Date   CHOL 145 08/16/2014   TRIG 320 (A) 08/16/2014   HDL 36 08/16/2014   LDLCALC 45 08/16/2014   Lab Results  Component Value Date   TSH 1.61 08/16/2014    Therapeutic Level Labs: No results found for: LITHIUM No results found for: VALPROATE No components found for:  CBMZ  Current Medications: Current Outpatient Medications  Medication Sig Dispense Refill  . diclofenac sodium (VOLTAREN) 1 % GEL Apply topically 4 (four) times daily.    . hydrochlorothiazide (HYDRODIURIL) 25 MG tablet Take 25 mg by mouth daily.    . hydrOXYzine (ATARAX/VISTARIL) 25 MG tablet Take 0.5-1 tablets (12.5-25 mg total) by mouth 3 (three) times daily as needed. For severe anxiety 90 tablet 1  . naproxen (NAPROSYN) 500 MG tablet TK 1 T PO BID WC  3  . pravastatin (PRAVACHOL) 10 MG tablet Take 10 mg by mouth daily.    . QUEtiapine (SEROQUEL) 25 MG tablet TAKE 1 TABLET(25 MG) BY MOUTH AT  BEDTIME FOR MOOD OR SLEEP 90 tablet 2  . Vilazodone HCl (VIIBRYD) 20 MG TABS TAKE 1 TABLET(20 MG) BY MOUTH DAILY 90 tablet 1   No current facility-administered medications for this visit.      Musculoskeletal: Strength & Muscle Tone: UTA Gait & Station: UTA Patient leans: N/A  Psychiatric Specialty Exam: Review of Systems  Psychiatric/Behavioral: The patient is nervous/anxious.   All other systems reviewed and are negative.   There were no vitals taken for this visit.There is no height or weight on file to calculate BMI.  General Appearance: UTA  Eye Contact:  UTA  Speech:  Normal Rate  Volume:  Normal  Mood:  Anxious improving  Affect:  UTA  Thought Process:  Goal Directed and Descriptions of Associations: Intact  Orientation:  Full (Time, Place, and Person)  Thought Content: Logical   Suicidal Thoughts:  No  Homicidal Thoughts:  No  Memory:  Immediate;   Fair Recent;   Fair Remote;   Fair  Judgement:  Fair  Insight:  Fair  Psychomotor Activity:  UTA  Concentration:  Concentration: Fair and Attention Span: Fair  Recall:  [employer] of Knowledge: Fair  Language: Fair  Akathisia:  No  Handed:  Right  AIMS (if indicated): Denies tremors, rigidity, stiffness  Assets:  Communication Skills Desire for Improvement Social Support  ADL's:  Intact  Cognition: WNL  Sleep:  Fair   Screenings: PHQ2-9     Office Visit from 04/02/2018 in [facility] Procedure visit from 02/25/2018 in [facility] Office Visit from 01/08/2018 in [facility] Office Visit from 07[facility]03/2017 in [facility] Procedure visit from 06/11/2017 in [facility]  PHQ-2 Total Score  0  0  0  0  0       Assessment and Plan: Kyle Hood is a 63 year old male to male transgender who has a history of  depression, anxiety, insomnia, gender dysphoria was evaluated by phone today.  Patient is currently making progress on the current medication regimen.  Patient with psychosocial stressors of the current COVID-19 outbreak however is making progress.  Plan as noted below.  Plan For  depression-stable Viibryd 20 mg p.o. daily Seroquel as prescribed  For anxiety symptoms-improving Viibryd 20 mg p.o. daily Continue CBT  For insomnia-stable Seroquel 12.5-25 mg p.o. nightly as needed  For gender dysphoria-chronic Continue psychotherapy  Patient to continue psychotherapy sessions with Ms. Peacock.  Follow-up in clinic in 2 months or sooner if needed.  Appointment scheduled for August 4 at 4:30 PM  I have spent atleast 15 minutes non face to face with patient today. More than 50 % of the time was spent for psychoeducation and supportive psychotherapy and care coordination.  This note was generated in part or whole with voice recognition software. Voice recognition is usually quite accurate but there are transcription errors that can and very often do occur. I apologize for any typographical errors that were not detected and corrected.          Jomarie Longs, MD 09/24/2018, 5:04 PM

## 2018-09-30 NOTE — Progress Notes (Signed)
Virtual Visit via Telephone Note  I connected with Kyle Hood on 09/24/18 at  3:00 PM EDT by telephone and verified that I am speaking with the correct person using two identifiers.  Location: Patient: home Provider: office   I discussed the limitations, risks, security and privacy concerns of performing an evaluation and management service by telephone and the availability of in person appointments. I also discussed with the patient that there may be a patient responsible charge related to this service. The patient expressed understanding and agreed to proceed.     Participation Level: Active  Type of Therapy: Individual Therapy  Treatment Goals addressed: Coping  Interventions: CBT and Motivational Interviewing  Summary: Kyle Hood is a 63 y.o. adult who presents with a reduction in symptoms.  Therapist explained cognitive development to Patient then challenged his thinking using a quick exercise. Therapist continued with cognitive distortions and irrational thoughts. Therapist and Patient reviewed emotions and ways that our thoughts overshadow and influence our emotions.    Suicidal/Homicidal: No  Plan: Return again in 2 weeks.  Diagnosis: Axis I: Depression    Axis II: No diagnosis    I discussed the assessment and treatment plan with the patient. The patient was provided an opportunity to ask questions and all were answered. The patient agreed with the plan and demonstrated an understanding of the instructions.   The patient was advised to call back or seek an in-person evaluation if the symptoms worsen or if the condition fails to improve as anticipated.  I provided 60 minutes of non-face-to-face time during this encounter.   Marinda Elk, LCSW

## 2018-10-29 ENCOUNTER — Other Ambulatory Visit: Payer: Self-pay

## 2018-10-29 ENCOUNTER — Ambulatory Visit: Payer: Medicare Other | Admitting: Licensed Clinical Social Worker

## 2018-12-01 ENCOUNTER — Ambulatory Visit (INDEPENDENT_AMBULATORY_CARE_PROVIDER_SITE_OTHER): Payer: Medicare Other | Admitting: Psychiatry

## 2018-12-01 ENCOUNTER — Encounter: Payer: Self-pay | Admitting: Psychiatry

## 2018-12-01 ENCOUNTER — Other Ambulatory Visit: Payer: Self-pay

## 2018-12-01 DIAGNOSIS — F410 Panic disorder [episodic paroxysmal anxiety] without agoraphobia: Secondary | ICD-10-CM

## 2018-12-01 DIAGNOSIS — F4010 Social phobia, unspecified: Secondary | ICD-10-CM | POA: Diagnosis not present

## 2018-12-01 DIAGNOSIS — F3341 Major depressive disorder, recurrent, in partial remission: Secondary | ICD-10-CM | POA: Insufficient documentation

## 2018-12-01 DIAGNOSIS — F640 Transsexualism: Secondary | ICD-10-CM

## 2018-12-01 NOTE — Progress Notes (Signed)
Virtual Visit via Telephone Note  I connected with Kyle Hood on 12/01/18 at  4:30 PM EDT by telephone and verified that I am speaking with the correct person using two identifiers.   I discussed the limitations, risks, security and privacy concerns of performing an evaluation and management service by telephone and the availability of in person appointments. I also discussed with the patient that there may be a patient responsible charge related to this service. The patient expressed understanding and agreed to proceed.    I discussed the assessment and treatment plan with the patient. The patient was provided an opportunity to ask questions and all were answered. The patient agreed with the plan and demonstrated an understanding of the instructions.   The patient was advised to call back or seek an in-person evaluation if the symptoms worsen or if the condition fails to improve as anticipated.   [place] MD OP Progress Note  12/01/2018 5:19 PM Kyle Hood  MRN:  [id number]  Chief Complaint:  Chief Complaint    Follow-up     HPI: Kyle Hood is a 63 year old transgender male to male, lives in [place], has a history of MDD, panic attacks, social anxiety disorder was evaluated by phone today.   Patient declined video call.  Patient today reports that there has been some psychosocial stressors recently where they live.  Patient reports that neighborhood children have been acting in ways which has been very stressful for them.  Patient however reports they have been taking hydroxyzine which helps as needed.  Patient also agrees to discuss the current stressors with therapist.  Has an upcoming appointment coming up.  Patient declines medication readjustment.  Reports they want to stay on the [place] as it is for now and does not want to make any changes at this time.  Denies any sleep issues.  Denies any suicidality, homicidality or perceptual disturbances.  Denies any other concerns  today.  Visit Diagnosis:    ICD-10-CM   1. MDD (major depressive disorder), recurrent, in partial remission ([place])  F33.41   2. Social anxiety disorder  F40.10   3. Panic disorder  F41.0   4. 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  F64.0     Past Psychiatric History: I have reviewed past psychiatric history from my progress note on 06/05/2017.  Past trials of Zoloft, Wellbutrin.  Past Medical History:  Past Medical History:  Diagnosis Date  . Abnormal liver function test   . Anxiety   . Anxiety   . Arthritis   . Chronic lower back pain   . Degenerative joint disease   . Depression   . Fatty liver   . Heart murmur   . Hepatitis B   . Hyperlipidemia   . Hypertension   . Lumbar polyradiculopathy   . Panic attacks   . Polyneuropathy   . Vertigo 05/03/2017   ER    Past Surgical History:  Procedure Laterality Date  . COLONOSCOPY    . COLONOSCOPY WITH PROPOFOL N/A 10/01/2017   Procedure: COLONOSCOPY WITH PROPOFOL;  Surgeon: Toledo, Benay Pike, MD;  Location: [facility] ENDOSCOPY;  Service: Endoscopy;  Laterality: N/A;  . PROSTATE BIOPSY      Family Psychiatric History: I have reviewed family psychiatric history from my progress note on 06/05/2017.  Family History:  Family History  Problem Relation Age of Onset  . Cancer Mother        Pancreatic  . Diabetes Mother   . Hypertension Mother   . Varicose Veins Mother   .  Alcohol abuse Mother   . Heart disease Father        CABG  . Cancer Father        Jaw  . Aortic aneurysm Father   . Congestive Heart Failure Father   . Hypertension Father   . Varicose Veins Father   . Alcohol abuse Father   . Bipolar disorder Sister   . Bipolar disorder Brother     Social History: I have reviewed social history from my progress note on 06/05/2017. Social History   Socioeconomic History  . Marital status: Significant Other    Spouse name: denise  . Number of children: 0  . Years of education: Not on file  . Highest education level: 11th grade   Occupational History    Comment: disabled  Social Needs  . Financial resource strain: Very hard  . Food insecurity    Worry: Never true    Inability: Never true  . Transportation needs    Medical: No    Non-medical: No  Tobacco Use  . Smoking status: Never Smoker  . Smokeless tobacco: Never Used  Substance and Sexual Activity  . Alcohol use: No    Frequency: Never  . Drug use: No  . Sexual activity: Not Currently  Lifestyle  . Physical activity    Days per week: 0 days    Minutes per session: 0 min  . Stress: Not on file  Relationships  . Social Musicianconnections    Talks on phone: Twice a week    Gets together: Never    Attends religious service: Never    Active member of club or organization: No    Attends meetings of clubs or organizations: Never    Relationship status: Living with partner  Other Topics Concern  . Not on file  Social History Narrative  . Not on file    Allergies:  Allergies  Allergen Reactions  . Lisinopril Cough    Metabolic Disorder Labs: Lab Results  Component Value Date   HGBA1C 6.1 08/16/2014   No results found for: PROLACTIN Lab Results  Component Value Date   CHOL 145 08/16/2014   TRIG 320 (A) 08/16/2014   HDL 36 08/16/2014   LDLCALC 45 08/16/2014   Lab Results  Component Value Date   TSH 1.61 08/16/2014    Therapeutic Level Labs: No results found for: LITHIUM No results found for: VALPROATE No components found for:  CBMZ  Current Medications: Current Outpatient Medications  Medication Sig Dispense Refill  . diclofenac sodium (VOLTAREN) 1 % GEL Apply topically 4 (four) times daily.    . hydrochlorothiazide (HYDRODIURIL) 25 MG tablet Take 25 mg by mouth daily.    . hydrOXYzine (ATARAX/VISTARIL) 25 MG tablet Take 0.5-1 tablets (12.5-25 mg total) by mouth 3 (three) times daily as needed. For severe anxiety 90 tablet 1  . naproxen (NAPROSYN) 500 MG tablet TK 1 T PO BID WC  3  . pravastatin (PRAVACHOL) 10 MG tablet Take 10 mg  by mouth daily.    . QUEtiapine (SEROQUEL) 25 MG tablet TAKE 1 TABLET(25 MG) BY MOUTH AT BEDTIME FOR MOOD OR SLEEP 90 tablet 2  . Vilazodone HCl (VIIBRYD) 20 MG TABS TAKE 1 TABLET(20 MG) BY MOUTH DAILY 90 tablet 1   No current facility-administered medications for this visit.      Musculoskeletal: Strength & Muscle Tone: UTA Gait & Station: Reports as WNL Patient leans: N/A  Psychiatric Specialty Exam: Review of Systems  Psychiatric/Behavioral: The patient is nervous/anxious.  All other systems reviewed and are negative.   There were no vitals taken for this visit.There is no height or weight on file to calculate BMI.  General Appearance: Casual  Eye Contact:  Fair  Speech:  Normal Rate  Volume:  Normal  Mood:  Anxious  Affect:  UTA  Thought Process:  Goal Directed and Descriptions of Associations: Intact  Orientation:  Full (Time, Place, and Person)  Thought Content: Logical   Suicidal Thoughts:  No  Homicidal Thoughts:  No  Memory:  Immediate;   Fair Recent;   Fair Remote;   Fair  Judgement:  Fair  Insight:  Fair  Psychomotor Activity:  UTA  Concentration:  Concentration: Fair and Attention Span: Fair  Recall:  FiservFair  Fund of Knowledge: Fair  Language: Fair  Akathisia:  No  Handed:  Right  AIMS (if indicated): Denies tremors, rigidity  Assets:  Communication Skills Desire for Improvement Social Support  ADL's:  Intact  Cognition: WNL  Sleep:  Fair   Screenings: PHQ2-9     [facility]2018 [facility] Procedure visit from [facility]/25/2018 [facility] Procedure [facility]11/2017 [facility]  PHQ-2 Total Score  0  0  0  0  0       Assessment  and Plan: Hessie Dienerlan is a 63 year old male to male transgender who has a history of depression, anxiety, insomnia, gender dysphoria was evaluated by phone today.  Patient continues to be compliant with medications , does have some anxiety symptoms as summarized above ,however declines medication changes today.  Patient agrees to work with therapist at this time.  Plan as noted below.  Plan For depression- stable Viibryd 20 mg p.o. daily Seroquel as prescribed  For anxiety symptoms-restless Viibryd 20 mg p.o. daily Continue CBT-has upcoming appointment coming up.  For insomnia-stable Seroquel 12.5 to 25 mg p.o. nightly as needed  For gender dysphoria-chronic Continue psychotherapy with Ms. Peacock.  Follow-up in clinic in 2 months or sooner if needed.  Appointment scheduled for September 29 at 2:45 PM  I have spent atleast 15 minutes non face to face with patient today. More than 50 % of the time was spent for psychoeducation and supportive psychotherapy and care coordination.  This note was generated in part or whole with voice recognition software. Voice recognition is usually quite accurate but there are transcription errors that can and very often do occur. I apologize for any typographical errors that were not detected and corrected.          Jomarie LongsSaramma Mackenzy Eisenberg, MD 12/01/2018, 5:19 PM

## 2018-12-03 ENCOUNTER — Ambulatory Visit: Payer: Medicare Other | Admitting: Licensed Clinical Social Worker

## 2018-12-10 ENCOUNTER — Ambulatory Visit (INDEPENDENT_AMBULATORY_CARE_PROVIDER_SITE_OTHER): Payer: Medicare Other | Admitting: Licensed Clinical Social Worker

## 2018-12-10 ENCOUNTER — Other Ambulatory Visit: Payer: Self-pay

## 2018-12-10 DIAGNOSIS — F4010 Social phobia, unspecified: Secondary | ICD-10-CM | POA: Diagnosis not present

## 2018-12-10 DIAGNOSIS — F3341 Major depressive disorder, recurrent, in partial remission: Secondary | ICD-10-CM

## 2019-01-07 ENCOUNTER — Ambulatory Visit: Payer: Medicare Other | Admitting: Licensed Clinical Social Worker

## 2019-01-19 ENCOUNTER — Other Ambulatory Visit: Payer: Self-pay

## 2019-01-19 ENCOUNTER — Ambulatory Visit (INDEPENDENT_AMBULATORY_CARE_PROVIDER_SITE_OTHER): Payer: Medicare Other | Admitting: Licensed Clinical Social Worker

## 2019-01-19 DIAGNOSIS — F3341 Major depressive disorder, recurrent, in partial remission: Secondary | ICD-10-CM | POA: Diagnosis not present
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
## 2019-01-26 ENCOUNTER — Ambulatory Visit: Payer: Medicare Other | Admitting: Psychiatry

## 2019-01-26 ENCOUNTER — Other Ambulatory Visit: Payer: Self-pay

## 2019-01-26 DIAGNOSIS — Z5329 Procedure and treatment not carried out because of patient's decision for other reasons: Secondary | ICD-10-CM | POA: Insufficient documentation

## 2019-01-26 DIAGNOSIS — Z91199 Patient's noncompliance with other medical treatment and regimen due to unspecified reason: Secondary | ICD-10-CM | POA: Insufficient documentation

## 2019-01-26 NOTE — Progress Notes (Signed)
Unable to complete call, unable to leave message

## 2019-02-03 ENCOUNTER — Other Ambulatory Visit: Payer: Self-pay

## 2019-02-03 ENCOUNTER — Encounter: Payer: Self-pay | Admitting: Psychiatry

## 2019-02-03 ENCOUNTER — Ambulatory Visit (INDEPENDENT_AMBULATORY_CARE_PROVIDER_SITE_OTHER): Payer: Medicare Other | Admitting: Psychiatry

## 2019-02-03 DIAGNOSIS — F3341 Major depressive disorder, recurrent, in partial remission: Secondary | ICD-10-CM | POA: Diagnosis not present

## 2019-02-03 DIAGNOSIS — F640 Transsexualism: Secondary | ICD-10-CM | POA: Diagnosis not present

## 2019-02-03 DIAGNOSIS — F4010 Social phobia, unspecified: Secondary | ICD-10-CM | POA: Diagnosis not present

## 2019-02-03 DIAGNOSIS — F410 Panic disorder [episodic paroxysmal anxiety] without agoraphobia: Secondary | ICD-10-CM | POA: Diagnosis not present

## 2019-02-03 NOTE — Progress Notes (Signed)
Virtual Visit via Telephone Note  I connected with Dara LordsAlan Fairbank on 02/03/19 at  2:45 PM EDT by telephone and verified that I am speaking with the correct person using two identifiers.   I discussed the limitations, risks, security and privacy concerns of performing an evaluation and management service by telephone and the availability of in person appointments. I also discussed with the patient that there may be a patient responsible charge related to this service. The patient expressed understanding and agreed to proceed.   I discussed the assessment and treatment plan with the patient. The patient was provided an opportunity to ask questions and all were answered. The patient agreed with the plan and demonstrated an understanding of the instructions.   The patient was advised to call back or seek an in-person evaluation if the symptoms worsen or if the condition fails to improve as anticipated.   [facility] MD OP Progress Note  02/03/2019 3:00 PM Dara Lordslan Elahi  MRN:  [id number]  Chief Complaint:  Chief Complaint    Follow-up     HPI: Hessie Dienerlan is a 63 year old transgender male to male, lives in [place], has a history of MDD, panic attacks, social anxiety disorder was evaluated by phone today.  Patient preferred to do a phone call.  Patient today reports they are doing well with regards to their mood.  They do not feel depressed or anxious at this time.  Patient reports sleep is good.  Patient denies any suicidality, homicidality or perceptual disturbances.  Patient reports they are compliant on medications as prescribed and denies side effects.  Patient continues to work with their therapist Ms. Nolon RodNicole Peacock and reports therapy is going well.  Patient denies any other concerns today. Visit Diagnosis:    ICD-10-CM   1. MDD (major depressive disorder), recurrent, in partial remission ([facility])  F33.41   2. Social anxiety disorder  F40.10   3. Panic disorder  F41.0   4. Gender dysphoria  in adult  F64.0     Past Psychiatric History: I have reviewed past psychiatric history from my progress note on 06/05/2017.  Past trials of Zoloft, Wellbutrin  Past Medical History:  Past Medical History:  Diagnosis Date  . Abnormal liver function test   . Anxiety   . Anxiety   . Arthritis   . Chronic lower back pain   . Degenerative joint disease   . Depression   . Fatty liver   . Heart murmur   . Hepatitis B   . Hyperlipidemia   . Hypertension   . Lumbar polyradiculopathy   . Panic attacks   . Polyneuropathy   . Vertigo 05/03/2017   ER    Past Surgical History:  Procedure Laterality Date  . COLONOSCOPY    . COLONOSCOPY WITH PROPOFOL N/A 10/01/2017   Procedure: COLONOSCOPY WITH PROPOFOL;  Surgeon: Toledo, [name], MD;  Location: [facility] ENDOSCOPY;  Service: Endoscopy;  Laterality: N/A;  . PROSTATE BIOPSY      Family Psychiatric History: I have reviewed family psychiatric history from my progress note on 06/05/2017  Family History:  Family History  Problem Relation Age of Onset  . Cancer Mother        Pancreatic  . Diabetes Mother   . Hypertension Mother   . Varicose Veins Mother   . Alcohol abuse Mother   . Heart disease Father        CABG  . Cancer Father        Jaw  . Aortic aneurysm Father   .  Congestive Heart Failure Father   . Hypertension Father   . Varicose Veins Father   . Alcohol abuse Father   . Bipolar disorder Sister   . Bipolar disorder Brother     Social History: I have reviewed social history from my progress note on 06/05/2017 Social History   Socioeconomic History  . Marital status: Significant Other    Spouse name: denise  . Number of children: 0  . Years of education: Not on file  . Highest education level: 11th grade  Occupational History    Comment: disabled  Social Needs  . Financial resource strain: Very hard  . Food insecurity    Worry: Never true    Inability: Never true  . Transportation needs    Medical: No    Non-medical:  No  Tobacco Use  . Smoking status: Never Smoker  . Smokeless tobacco: Never Used  Substance and Sexual Activity  . Alcohol use: No    Frequency: Never  . Drug use: No  . Sexual activity: Not Currently  Lifestyle  . Physical activity    Days per week: 0 days    Minutes per session: 0 min  . Stress: Not on file  Relationships  . Social Herbalist on phone: Twice a week    Gets together: Never    Attends religious service: Never    Active member of club or organization: No    Attends meetings of clubs or organizations: Never    Relationship status: Living with partner  Other Topics Concern  . Not on file  Social History Narrative  . Not on file    Allergies:  Allergies  Allergen Reactions  . Lisinopril Cough    Metabolic Disorder Labs: Lab Results  Component Value Date   HGBA1C 6.1 08/16/2014   No results found for: PROLACTIN Lab Results  Component Value Date   CHOL 145 08/16/2014   TRIG 320 (A) 08/16/2014   HDL 36 08/16/2014   LDLCALC 45 08/16/2014   Lab Results  Component Value Date   TSH 1.61 08/16/2014    Therapeutic Level Labs: No results found for: LITHIUM No results found for: VALPROATE No components found for:  CBMZ  Current Medications: Current Outpatient Medications  Medication Sig Dispense Refill  . diclofenac sodium (VOLTAREN) 1 % GEL Apply topically 4 (four) times daily.    . hydrochlorothiazide (HYDRODIURIL) 25 MG tablet Take 25 mg by mouth daily.    . hydrOXYzine (ATARAX/VISTARIL) 25 MG tablet Take 0.5-1 tablets (12.5-25 mg total) by mouth 3 (three) times daily as needed. For severe anxiety 90 tablet 1  . naproxen (NAPROSYN) 500 MG tablet TK 1 T PO BID WC  3  . pravastatin (PRAVACHOL) 10 MG tablet Take 10 mg by mouth daily.    . QUEtiapine (SEROQUEL) 25 MG tablet TAKE 1 TABLET(25 MG) BY MOUTH AT BEDTIME FOR MOOD OR SLEEP 90 tablet 2  . Vilazodone HCl (VIIBRYD) 20 MG TABS TAKE 1 TABLET(20 MG) BY MOUTH DAILY 90 tablet 1   No  current facility-administered medications for this visit.      Musculoskeletal: Strength & Muscle Tone: UTA Gait & Station: Reports as WNL Patient leans: N/A  Psychiatric Specialty Exam: Review of Systems  Psychiatric/Behavioral: Negative for hallucinations, substance abuse and suicidal ideas. The patient is not nervous/anxious.   All other systems reviewed and are negative.   There were no vitals taken for this visit.There is no height or weight on file to calculate BMI.  General Appearance: UTA  Eye Contact:  UTA  Speech:  Clear and Coherent  Volume:  Normal  Mood:  Euthymic  Affect:  UTA  Thought Process:  Goal Directed and Descriptions of Associations: Intact  Orientation:  Full (Time, Place, and Person)  Thought Content: Logical   Suicidal Thoughts:  No  Homicidal Thoughts:  No  Memory:  Immediate;   Fair Recent;   Fair Remote;   Fair  Judgement:  Fair  Insight:  Fair  Psychomotor Activity:  UTA  Concentration:  Concentration: Fair and Attention Span: Fair  Recall:  [employer]erv of Knowledge: Fair  Language: Fair  Akathisia:  No  Handed:  Right  AIMS (if indicated): Denies tremors, rigidity  Assets:  Communication Skills Desire for Improvement Housing Social Support  ADL's:  Intact  Cognition: WNL  Sleep:  Fair   Screenings: PHQ2-9     [facility] Visit from 02/03/2019 in [facility] Office Visit from 04/02/2018 in [facility] Procedure visit from 02[facility]25/2018 [facility] Office Visit from 01/08/2018 in [facility] Office Visit from 07/03/2017 in [facility]  PHQ-2 Total Score  3  0  0  0  0  PHQ-9 Total Score  7  -  -  -  -       Assessment and Plan: Sarkis is a 63 year old male to male transgender who has a history of depression, anxiety, insomnia, gender  dysphoria was evaluated by phone today.  Patient continues to be compliant with medications and reports mood symptoms are stable.  Plan Depression in remission Viibryd 20 mg p.o. daily Seroquel as prescribed PHQ 9 - 7  Anxiety disorder-improving Viibryd 20 mg p.o. daily Continue CBT with Ms. Peacock  Insomnia-stable Seroquel 12.5-25 mg p.o. nightly  Gender dysphoria-chronic We will continue to work with Ms. Peacock   Patient does report some on and off nightmares and hence discussed dream planning.  Follow-up in clinic in 2 to 3 months or sooner if needed.  December 18 at 11 AM  I have spent atleast 15 minutes non  face to face with patient today. More than 50 % of the time was spent for psychoeducation and supportive psychotherapy and care coordination. This note was generated in part or whole with voice recognition software. Voice recognition is usually quite accurate but there are transcription errors that can and very often do occur. I apologize for any typographical errors that were not detected and corrected.      Jomarie Longs, MD 02/03/2019, 3:00 PM

## 2019-02-18 ENCOUNTER — Ambulatory Visit: Payer: Medicare Other | Admitting: Licensed Clinical Social Worker

## 2019-02-18 ENCOUNTER — Other Ambulatory Visit: Payer: Self-pay

## 2019-02-27 NOTE — Progress Notes (Signed)
Virtual Visit via Telephone Note  I connected with Kyle Hood on 12/10/18 at  2:00 PM EDT by telephone and verified that I am speaking with the correct person using two identifiers.  Location: Patient: home Provider: office   I discussed the limitations, risks, security and privacy concerns of performing an evaluation and management service by telephone and the availability of in person appointments. I also discussed with the patient that there may be a patient responsible charge related to this service. The patient expressed understanding and agreed to proceed.   History of Present Illness: Kyle Hood has a history of depressive symptoms.  He has difficulty with social engagement with his neighbors.  Kyle Hood has difficulty with being in crowds.    Observations/Objective: To reduce depressive and anxious feelings/symptoms.  Therapist provided active listening as Kyle Hood shared details about herself, her current mood and stressors.  Therapist provided support and feedback for Kyle Hood as she expressed concern about her level of frustration with her boyfriend and her feelings toward their relationship.  Therapist listened and challenged Kyle Hood to explore alternate ways of explaining her point without yelling or getting frustrated.  Therapist assisted Kyle Hood with deep breathing techniques to calm down.  Therapist assisted Kyle Hood and provided additional support and feedback to address Kyle Hood's concerns regarding her relationship status.  Therapist shifted focus of discussion to managing current symptoms, avoiding triggers improving problem-solving skills.  Therapist praised Kyle Hood for her attempts to problem solve regarding her relationships concern. Therapist addressed Kyle Hood's concerns about her inability to communicate to her boyfriend about need for emotional closeness, finances and trust.    Assessment and Plan: Kyle Hood is making slight progress.  Kyle Hood will continue with Kyle Hood treatment   Follow Up Instructions: 2 week follow  up    I discussed the assessment and treatment plan with the patient. The patient was provided an opportunity to ask questions and all were answered. The patient agreed with the plan and demonstrated an understanding of the instructions.   The patient was advised to call back or seek an in-person evaluation if the symptoms worsen or if the condition fails to improve as anticipated.  I provided 45 minutes of non-face-to-face time during this encounter.   Kyle South, LCSW

## 2019-03-18 ENCOUNTER — Other Ambulatory Visit: Payer: Self-pay

## 2019-03-18 ENCOUNTER — Ambulatory Visit (INDEPENDENT_AMBULATORY_CARE_PROVIDER_SITE_OTHER): Payer: Medicare Other | Admitting: Licensed Clinical Social Worker

## 2019-03-18 DIAGNOSIS — R7301 Impaired fasting glucose: Secondary | ICD-10-CM | POA: Insufficient documentation

## 2019-03-18 DIAGNOSIS — F3341 Major depressive disorder, recurrent, in partial remission: Secondary | ICD-10-CM | POA: Diagnosis not present

## 2019-03-23 ENCOUNTER — Telehealth: Payer: Self-pay

## 2019-03-23 NOTE — Telephone Encounter (Signed)
pt states he doesnt have $50 for a no show fee for date 01-26-19 he states he can not help that his phone keeps dropping calls

## 2019-03-23 NOTE — Telephone Encounter (Signed)
Pt was told to call phone number on bill and pt was also transferred to [place] a the front desk.

## 2019-03-23 NOTE — Telephone Encounter (Signed)
I do not know who can help him with this . Could you please ask him to contact billing department.
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
## 2019-04-01 ENCOUNTER — Telehealth: Payer: Self-pay
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
## 2019-04-13 ENCOUNTER — Ambulatory Visit: Payer: Medicare Other | Admitting: Licensed Clinical Social Worker

## 2019-04-13 ENCOUNTER — Other Ambulatory Visit: Payer: Self-pay

## 2019-04-16 ENCOUNTER — Other Ambulatory Visit: Payer: Self-pay

## 2019-04-16 ENCOUNTER — Ambulatory Visit (INDEPENDENT_AMBULATORY_CARE_PROVIDER_SITE_OTHER): Payer: Medicare Other | Admitting: Psychiatry

## 2019-04-16 ENCOUNTER — Encounter: Payer: Self-pay | Admitting: Psychiatry

## 2019-04-16 DIAGNOSIS — F4010 Social phobia, unspecified: Secondary | ICD-10-CM

## 2019-04-16 DIAGNOSIS — F3342 Major depressive disorder, recurrent, in full remission: Secondary | ICD-10-CM | POA: Diagnosis not present

## 2019-04-16 DIAGNOSIS — F410 Panic disorder [episodic paroxysmal anxiety] without agoraphobia: Secondary | ICD-10-CM | POA: Diagnosis not present

## 2019-04-16 MED ORDER — QUETIAPINE FUMARATE 25 MG PO TABS: ORAL_TABLET | ORAL | 1 refills | Status: DC

## 2019-04-16 MED ORDER — VIIBRYD 20 MG PO TABS: ORAL_TABLET | ORAL | 1 refills | Status: DC

## 2019-04-16 NOTE — Progress Notes (Signed)
Virtual Visit via Telephone Note  I connected with Kyle Hood on 04/16/19 at 11:00 AM EST by telephone and verified that I am speaking with the correct person using two identifiers.   I discussed the limitations, risks, security and privacy concerns of performing an evaluation and management service by telephone and the availability of in person appointments. I also discussed with the patient that there may be a patient responsible charge related to this service. The patient expressed understanding and agreed to proceed.     I discussed the assessment and treatment plan with the patient. The patient was provided an opportunity to ask questions and all were answered. The patient agreed with the plan and demonstrated an understanding of the instructions.   The patient was advised to call back or seek an in-person evaluation if the symptoms worsen or if the condition fails to improve as anticipated.   BH MD OP Progress Note  04/16/2019 11:19 AM Kyle Lordslan Luellen  MRN:  [id number]  Chief Complaint:  Chief Complaint    Follow-up     HPI: Kyle Hood is a 63 year old transgender male to male, lives in [place], has a history of MDD, panic attacks, social anxiety disorder was evaluated by phone today.  Patient preferred to do a phone call.  Patient today reports he is currently coping with psychosocial stressors of relationship struggles with his neighbor.  Patient reports he cannot move from that apartment since his partner owns it.  Patient reports he has been talking to his therapist and that has been very helpful.  Patient reports mood wise he is doing okay.  Denies any significant depression or anxiety symptoms.  Patient reports he takes Seroquel as needed for sleep and that helps.  Denies any side effects to the medications.  Patient denies any suicidality, homicidality or perceptual disturbances.  Patient denies any other concerns today. Visit Diagnosis:    ICD-10-CM   1. MDD (major  depressive disorder), recurrent, in full remission (HCC)  F33.42 QUEtiapine (SEROQUEL) 25 MG tablet  2. Social anxiety disorder  F40.10 Vilazodone HCl (VIIBRYD) 20 MG TABS  3. Panic disorder  F41.0 QUEtiapine (SEROQUEL) 25 MG tablet    Past Psychiatric History: Reviewed past psychiatric history from my progress note on 06/05/2017.  Past trials of Zoloft, Wellbutrin.  Past Medical History:  Past Medical History:  Diagnosis Date  . Abnormal liver function test   . Anxiety   . Anxiety   . Arthritis   . Chronic lower back pain   . Degenerative joint disease   . Depression   . Fatty liver   . Heart murmur   . Hepatitis B   . Hyperlipidemia   . Hypertension   . Lumbar polyradiculopathy   . Panic attacks   . Polyneuropathy   . Vertigo 05/03/2017   ER    Past Surgical History:  Procedure Laterality Date  . COLONOSCOPY    . COLONOSCOPY WITH PROPOFOL N/A 10/01/2017   Procedure: COLONOSCOPY WITH PROPOFOL;  Surgeon: Toledo, [name], MD;  Location: [facility] ENDOSCOPY;  Service: Endoscopy;  Laterality: N/A;  . PROSTATE BIOPSY      Family Psychiatric History: Reviewed family psychiatric history from my progress note on 06/05/2017.  Family History:  Family History  Problem Relation Age of Onset  . Cancer Mother        Pancreatic  . Diabetes Mother   . Hypertension Mother   . Varicose Veins Mother   . Alcohol abuse Mother   . Heart disease Father  CABG  . Cancer Father        Jaw  . Aortic aneurysm Father   . Congestive Heart Failure Father   . Hypertension Father   . Varicose Veins Father   . Alcohol abuse Father   . Bipolar disorder Sister   . Bipolar disorder Brother     Social History: Reviewed social history from my progress note on 06/05/2017. Social History   Socioeconomic History  . Marital status: Significant Other    Spouse name: denise  . Number of children: 0  . Years of education: Not on file  . Highest education level: 11th grade  Occupational History     Comment: disabled  Tobacco Use  . Smoking status: Never Smoker  . Smokeless tobacco: Never Used  Substance and Sexual Activity  . Alcohol use: No  . Drug use: No  . Sexual activity: Not Currently  Other Topics Concern  . Not on file  Social History Narrative  . Not on file   Social Determinants of Health   Financial Resource Strain:   . Difficulty of Paying Living Expenses: Not on file  Food Insecurity:   . Worried About Programme researcher, broadcasting/film/video in the Last Year: Not on file  . Ran Out of Food in the Last Year: Not on file  Transportation Needs:   . Lack of Transportation (Medical): Not on file  . Lack of Transportation (Non-Medical): Not on file  Physical Activity:   . Days of Exercise per Week: Not on file  . Minutes of Exercise per Session: Not on file  Stress:   . Feeling of Stress : Not on file  Social Connections:   . Frequency of Communication with Friends and Family: Not on file  . Frequency of Social Gatherings with Friends and Family: Not on file  . Attends Religious Services: Not on file  . Active Member of Clubs or Organizations: Not on file  . Attends Banker Meetings: Not on file  . Marital Status: Not on file    Allergies:  Allergies  Allergen Reactions  . Lisinopril Cough    Metabolic Disorder Labs: Lab Results  Component Value Date   HGBA1C 6.1 08/16/2014   No results found for: PROLACTIN Lab Results  Component Value Date   CHOL 145 08/16/2014   TRIG 320 (A) 08/16/2014   HDL 36 08/16/2014   LDLCALC 45 08/16/2014   Lab Results  Component Value Date   TSH 1.61 08/16/2014    Therapeutic Level Labs: No results found for: LITHIUM No results found for: VALPROATE No components found for:  CBMZ  Current Medications: Current Outpatient Medications  Medication Sig Dispense Refill  . clotrimazole-betamethasone (LOTRISONE) cream Apply topically.    . valsartan (DIOVAN) 40 MG tablet Take by mouth.    . diclofenac sodium (VOLTAREN) 1 %  GEL Apply topically 4 (four) times daily.    . hydrochlorothiazide (HYDRODIURIL) 25 MG tablet Take 25 mg by mouth daily.    . hydrOXYzine (ATARAX/VISTARIL) 25 MG tablet Take 0.5-1 tablets (12.5-25 mg total) by mouth 3 (three) times daily as needed. For severe anxiety 90 tablet 1  . naproxen (NAPROSYN) 500 MG tablet TK 1 T PO BID WC  3  . pravastatin (PRAVACHOL) 10 MG tablet Take 10 mg by mouth daily.    . QUEtiapine (SEROQUEL) 25 MG tablet TAKE 1 TABLET(25 MG) BY MOUTH AT BEDTIME FOR MOOD OR SLEEP 90 tablet 1  . valsartan (DIOVAN) 40 MG tablet Take 40 mg  by mouth daily.    . Vilazodone HCl (VIIBRYD) 20 MG TABS TAKE 1 TABLET(20 MG) BY MOUTH DAILY 90 tablet 1   No current facility-administered medications for this visit.     Musculoskeletal: Strength & Muscle Tone: UTA Gait & Station: Reports as WNL Patient leans: N/A  Psychiatric Specialty Exam: Review of Systems  Psychiatric/Behavioral: The patient is nervous/anxious.   All other systems reviewed and are negative.   There were no vitals taken for this visit.There is no height or weight on file to calculate BMI.  General Appearance: UTA  Eye Contact:  UTA  Speech:  Clear and Coherent  Volume:  Normal  Mood:  Anxious improving  Affect:  UTA  Thought Process:  Goal Directed and Descriptions of Associations: Intact  Orientation:  Full (Time, Place, and Person)  Thought Content: Logical   Suicidal Thoughts:  No  Homicidal Thoughts:  No  Memory:  Immediate;   Fair Recent;   Fair Remote;   Fair  Judgement:  Fair  Insight:  Fair  Psychomotor Activity:  UTA  Concentration:  Concentration: Fair and Attention Span: Fair  Recall:  [employer] of Knowledge: Fair  Language: Fair  Akathisia:  No  Handed:  Right  AIMS (if indicated):Denies tremors, rigidity  Assets:  Communication Skills Desire for Improvement Social Support  ADL's:  Intact  Cognition: WNL  Sleep:  Fair   Screenings: PHQ2-9     Office Visit from 02/03/2019 in  [place] Office Visit from 04/02/2018 in [place] Procedure visit from 02/25/2018 in [place] Office Visit from 01/08/2018 in [place] Office Visit from 07/03/2017 in [place]  PHQ-2 Total Score  3  0  0  0  0  PHQ-9 Total Score  7  --  --  --  --       Assessment and Plan: Kyle Hood is a 63 year old male to male transgender who has a history of depression, anxiety, insomnia, was evaluated by phone today.  Patient is currently stable on current medication regimen although struggles with psychosocial stressors of relationship struggles.  Patient will continue to benefit from psychotherapy sessions.  Plan as noted below.  Plan Depression in remission Viibryd 20 mg p.o. daily Seroquel as prescribed.  Anxiety disorder-stable Viibryd 20 mg p.o. daily Continue CBT with Ms. Peacock  Insomnia-stable Seroquel 12.5 to 25 mg p.o. nightly  Follow-up in clinic in 6 weeks or sooner if needed.  January 29 at 11:40 AM  I have spent atleast 15 minutes non face to face with patient today. More than 50 % of the time was spent for psychoeducation and supportive psychotherapy and care coordination.    Ursula Alert, MD 04/16/2019, 11:19 AM

## 2019-04-28 ENCOUNTER — Telehealth: Payer: Self-pay

## 2019-04-28 NOTE — Telephone Encounter (Signed)
Yes please ask him to double up on hydroxyzine.
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
## 2019-05-04 ENCOUNTER — Ambulatory Visit (INDEPENDENT_AMBULATORY_CARE_PROVIDER_SITE_OTHER): Payer: Medicare Other | Admitting: Licensed Clinical Social Worker

## 2019-05-04 ENCOUNTER — Other Ambulatory Visit: Payer: Self-pay

## 2019-05-04 DIAGNOSIS — F4010 Social phobia, unspecified: Secondary | ICD-10-CM

## 2019-05-04 DIAGNOSIS — F640 Transsexualism: Secondary | ICD-10-CM | POA: Diagnosis not present

## 2019-05-04 DIAGNOSIS — F331 Major depressive disorder, recurrent, moderate: Secondary | ICD-10-CM
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
## 2019-05-06 ENCOUNTER — Ambulatory Visit (INDEPENDENT_AMBULATORY_CARE_PROVIDER_SITE_OTHER): Payer: Medicare Other | Admitting: Vascular Surgery

## 2019-05-18 ENCOUNTER — Ambulatory Visit (HOSPITAL_COMMUNITY): Payer: Medicare Other | Admitting: Licensed Clinical Social Worker

## 2019-05-20 ENCOUNTER — Other Ambulatory Visit: Payer: Self-pay

## 2019-05-20 ENCOUNTER — Encounter (INDEPENDENT_AMBULATORY_CARE_PROVIDER_SITE_OTHER): Payer: Self-pay | Admitting: Vascular Surgery

## 2019-05-20 ENCOUNTER — Ambulatory Visit (INDEPENDENT_AMBULATORY_CARE_PROVIDER_SITE_OTHER): Payer: Medicare Other | Admitting: Vascular Surgery

## 2019-05-20 ENCOUNTER — Encounter (INDEPENDENT_AMBULATORY_CARE_PROVIDER_SITE_OTHER): Payer: Self-pay

## 2019-05-20 VITALS — BP 167/76 | HR 91 | Resp 16 | Ht 74.0 in | Wt 275.2 lb

## 2019-05-20 DIAGNOSIS — M8949 Other hypertrophic osteoarthropathy, multiple sites: Secondary | ICD-10-CM

## 2019-05-20 DIAGNOSIS — I872 Venous insufficiency (chronic) (peripheral): Secondary | ICD-10-CM | POA: Diagnosis not present

## 2019-05-20 DIAGNOSIS — M159 Polyosteoarthritis, unspecified: Secondary | ICD-10-CM

## 2019-05-20 DIAGNOSIS — I890 Lymphedema, not elsewhere classified: Secondary | ICD-10-CM | POA: Diagnosis not present

## 2019-05-20 DIAGNOSIS — E785 Hyperlipidemia, unspecified: Secondary | ICD-10-CM | POA: Diagnosis not present

## 2019-05-20 NOTE — Progress Notes (Signed)
MRN : [id number]  Kyle Hood is a 64 y.o. ([birth year]) adult who presents with chief complaint of  Chief Complaint  Patient presents with  . Follow-up    ref [facility] for lymphedema  .  History of Present Illness:   Patient is seen for evaluation of leg swelling. The patient first noticed the swelling remotely but is now concerned because of a recent increase in the overall edema. The swelling is associated with some pain and discoloration. The patient notes that in the morning the legs are significantly improved but they steadily worsened throughout the course of the day. Elevation makes the legs better, dependency makes them much worse.   There is no history of ulcerations associated with the swelling.   The patient denies any recent changes in their medications.  The patient has not been wearing graduated compression.  The patient has no had any past angiography, interventions or vascular surgery.  The patient denies a history of DVT or PE. There is no prior history of phlebitis. There is no history of primary lymphedema.  There is no history of radiation treatment to the groin or pelvis No history of malignancies. No history of trauma or groin or pelvic surgery. No history of foreign travel or parasitic infections area    Current Meds  Medication Sig  . clotrimazole-betamethasone (LOTRISONE) cream Apply topically.  . diclofenac sodium (VOLTAREN) 1 % GEL Apply topically as needed.   . hydrochlorothiazide (HYDRODIURIL) 25 MG tablet Take 25 mg by mouth daily.  . hydrOXYzine (ATARAX/VISTARIL) 25 MG tablet Take 0.5-1 tablets (12.5-25 mg total) by mouth 3 (three) times daily as needed. For severe anxiety  . naproxen (NAPROSYN) 500 MG tablet TK 1 T PO BID WC  . pravastatin (PRAVACHOL) 10 MG tablet Take 10 mg by mouth daily.  . QUEtiapine (SEROQUEL) 25 MG tablet TAKE 1 TABLET(25 MG) BY MOUTH AT BEDTIME FOR MOOD OR SLEEP  . valsartan (DIOVAN) 40 MG tablet Take by mouth.  .  Vilazodone HCl (VIIBRYD) 20 MG TABS TAKE 1 TABLET(20 MG) BY MOUTH DAILY    Past Medical History:  Diagnosis Date  . Abnormal liver function test   . Anxiety   . Anxiety   . Arthritis   . Chronic lower back pain   . Degenerative joint disease   . Depression   . Fatty liver   . Heart murmur   . Hepatitis B   . Hyperlipidemia   . Hypertension   . Lumbar polyradiculopathy   . Panic attacks   . Polyneuropathy   . Vertigo 05/03/2017   ER    Past Surgical History:  Procedure Laterality Date  . COLONOSCOPY    . COLONOSCOPY WITH PROPOFOL N/A 10/01/2017   Procedure: COLONOSCOPY WITH PROPOFOL;  Surgeon: Toledo, Boykin Nearing, MD;  Location: [facility] ENDOSCOPY;  Service: Endoscopy;  Laterality: N/A;  . PROSTATE BIOPSY      Social History Social History   Tobacco Use  . Smoking status: Never Smoker  . Smokeless tobacco: Never Used  Substance Use Topics  . Alcohol use: No  . Drug use: No    Family History Family History  Problem Relation Age of Onset  . Cancer Mother        Pancreatic  . Diabetes Mother   . Hypertension Mother   . Varicose Veins Mother   . Alcohol abuse Mother   . Heart disease Father        CABG  . Cancer Father  Jaw  . Aortic aneurysm Father   . Congestive Heart Failure Father   . Hypertension Father   . Varicose Veins Father   . Alcohol abuse Father   . Bipolar disorder Sister   . Bipolar disorder Brother   No family history of bleeding/clotting disorders, porphyria or autoimmune disease   Allergies  Allergen Reactions  . Lisinopril Cough     REVIEW OF SYSTEMS (Negative unless checked)  Constitutional: [] Weight loss  [] Fever  [] Chills Cardiac: [] Chest pain   [] Chest pressure   [] Palpitations   [] Shortness of breath when laying flat   [] Shortness of breath with exertion. Vascular:  [] Pain in legs with walking   [] Pain in legs at rest  [] History of DVT   [] Phlebitis   [x] Swelling in legs   [] Varicose veins   [] Non-healing ulcers Pulmonary:    [] Uses home oxygen   [] Productive cough   [] Hemoptysis   [] Wheeze  [] COPD   [] Asthma Neurologic:  [] Dizziness   [] Seizures   [] History of stroke   [] History of TIA  [] Aphasia   [] Vissual changes   [] Weakness or numbness in arm   [] Weakness or numbness in leg Musculoskeletal:   [] Joint swelling   [] Joint pain   [] Low back pain Hematologic:  [] Easy bruising  [] Easy bleeding   [] Hypercoagulable state   [] Anemic Gastrointestinal:  [] Diarrhea   [] Vomiting  [] Gastroesophageal reflux/heartburn   [] Difficulty swallowing. Genitourinary:  [] Chronic kidney disease   [] Difficult urination  [] Frequent urination   [] Blood in urine Skin:  [x] Rashes   [] Ulcers  Psychological:  [] History of anxiety   []  History of major depression.  Physical Examination  Vitals:   05/20/19 1043  BP: (!) 167/76  Pulse: 91  Resp: 16  Weight: 275 lb 3.2 oz (124.8 kg)  Height: 6\' 2"  (1.88 m)   Body mass index is 35.33 kg/m. Gen: WD/WN, NAD Head: [place]/AT, No temporalis wasting.  Ear/Nose/Throat: Hearing grossly intact, nares w/o erythema or drainage, poor dentition Eyes: PER, EOMI, sclera nonicteric.  Neck: Supple, no masses.  No bruit or JVD.  Pulmonary:  Good air movement, clear to auscultation bilaterally, no use of accessory muscles.  Cardiac: RRR, normal S1, S2, no Murmurs. Vascular: scattered varicosities present bilaterally.  Moderate venous stasis changes to the legs bilaterally.  2+ soft pitting edema Gastrointestinal: soft, non-distended. No guarding/no peritoneal signs.  Musculoskeletal: M/S 5/5 throughout.  No deformity or atrophy.  Neurologic: CN 2-12 intact. Pain and light touch intact in extremities.  Symmetrical.  Speech is fluent. Motor exam as listed above. Psychiatric: Judgment intact, Mood & affect appropriate for pt's clinical situation. Dermatologic: Venous rashes no ulcers noted.  No changes consistent with cellulitis. Lymph : No Cervical lymphadenopathy, no lichenification or skin changes of  chronic lymphedema.  CBC Lab Results  Component Value Date   WBC 10.4 05/03/2017   HGB 15.1 05/03/2017   HCT 43.2 05/03/2017   MCV 97.2 05/03/2017   PLT 258 05/03/2017    BMET    Component Value Date/Time   NA 137 05/03/2017 2006   NA 137 08/16/2014 0000   K 3.2 (L) 05/03/2017 2006   CL 102 05/03/2017 2006   CO2 24 05/03/2017 2006   GLUCOSE 129 (H) 05/03/2017 2006   BUN 17 05/03/2017 2006   BUN 11 08/16/2014 0000   CREATININE 0.94 05/03/2017 2006   CALCIUM 8.8 (L) 05/03/2017 2006   GFRNONAA >60 05/03/2017 2006   GFRAA >60 05/03/2017 2006   CrCl cannot be calculated (Patient's most recent lab result  is older than the maximum 21 days allowed.).  COAG Lab Results  Component Value Date   INR 0.96 12/30/2015    Radiology No results found.   Assessment/Plan 1. Lymphedema No surgery or intervention at this point in time.    I have had a long discussion with the patient regarding venous insufficiency and why it  causes symptoms. I have discussed with the patient the chronic skin changes that accompany venous insufficiency and the long term sequela such as infection and ulceration.  Patient will begin wearing graduated compression stockings on a daily basis a prescription was given. The patient will put the stockings on first thing in the morning and removing them in the evening. The patient is instructed specifically not to sleep in the stockings.    In addition, behavioral modification including several periods of elevation of the lower extremities during the day will be continued. I have demonstrated that proper elevation is a position with the ankles at heart level.  The patient is instructed to begin routine exercise, especially walking on a daily basis  The patient can be assessed for a Lymph Pump if his edema worsens  2. Chronic venous insufficiency No surgery or intervention at this point in time.    I have had a long discussion with the patient regarding venous  insufficiency and why it  causes symptoms. I have discussed with the patient the chronic skin changes that accompany venous insufficiency and the long term sequela such as infection and ulceration.  Patient will begin wearing graduated compression stockings on a daily basis a prescription was given. The patient will put the stockings on first thing in the morning and removing them in the evening. The patient is instructed specifically not to sleep in the stockings.    In addition, behavioral modification including several periods of elevation of the lower extremities during the day will be continued. I have demonstrated that proper elevation is a position with the ankles at heart level.  The patient is instructed to begin routine exercise, especially walking on a daily basis  The patient can be assessed for a Lymph Pump if his edema worsens   3. Primary osteoarthritis involving multiple joints Continue NSAID medications as already ordered, these medications have been reviewed and there are no changes at this time.  Continued activity and therapy was stressed.   4. Hyperlipidemia, unspecified hyperlipidemia type Continue statin as ordered and reviewed, no changes at this time    Levora Dredge, MD  05/20/2019 10:47 AM

## 2019-05-27 ENCOUNTER — Ambulatory Visit (INDEPENDENT_AMBULATORY_CARE_PROVIDER_SITE_OTHER): Payer: Medicare Other | Admitting: Vascular Surgery

## 2019-05-28 ENCOUNTER — Other Ambulatory Visit: Payer: Self-pay

## 2019-05-28 ENCOUNTER — Ambulatory Visit (INDEPENDENT_AMBULATORY_CARE_PROVIDER_SITE_OTHER): Payer: Medicare Other | Admitting: Psychiatry

## 2019-05-28 ENCOUNTER — Encounter: Payer: Self-pay | Admitting: Psychiatry

## 2019-05-28 DIAGNOSIS — F3342 Major depressive disorder, recurrent, in full remission: Secondary | ICD-10-CM

## 2019-05-28 DIAGNOSIS — F4010 Social phobia, unspecified: Secondary | ICD-10-CM

## 2019-05-28 DIAGNOSIS — F331 Major depressive disorder, recurrent, moderate: Secondary | ICD-10-CM | POA: Insufficient documentation

## 2019-05-28 DIAGNOSIS — F410 Panic disorder [episodic paroxysmal anxiety] without agoraphobia: Secondary | ICD-10-CM | POA: Diagnosis not present

## 2019-05-28 MED ORDER — HYDROXYZINE HCL 25 MG PO TABS: 12.5000 mg | ORAL_TABLET | Freq: Three times a day (TID) | ORAL | 1 refills | Status: DC | PRN

## 2019-05-28 NOTE — Progress Notes (Signed)
Provider Location : [facility] Patient Location : Home   Virtual Visit via Telephone Note  I connected with [name] on 05/28/19 at 10:20 AM EST by telephone and verified that I am speaking with the correct person using two identifiers.   I discussed the limitations, risks, security and privacy concerns of performing an evaluation and management service by telephone and the availability of in person appointments. I also discussed with the patient that there may be a patient responsible charge related to this service. The patient expressed understanding and agreed to proceed.      I discussed the assessment and treatment plan with the patient. The patient was provided an opportunity to ask questions and all were answered. The patient agreed with the plan and demonstrated an understanding of the instructions.   The patient was advised to call back or seek an in-person evaluation if the symptoms worsen or if the condition fails to improve as anticipated.    Que[place] MD OP Progress Note  05/28/2019 12:24 PM Donie Lemelin  MRN:  [id number]  Chief Complaint:  Chief Complaint    Follow-up     HPI: Acelin is a 64 year old transgender male to male, lives in [place], has a history of MDD, panic attack, social anxiety disorder was evaluated by phone today.  Patient preferred to do a phone call.  Patient today reports that he is currently doing well on the current medication regimen.  Patient denies any significant depression at this time.  Patient does report anxiety due to situational stressors.  Patient continues to have stressors from the neighbor.  Patient however reports they are coping better than before.  Patient reports they have started psychotherapy sessions with a new therapist and reports therapy sessions as going well.  They really like this therapist.  Patient reports sleep and appetite is fair.  Denies suicidality, homicidality or perceptual disturbances.  Patient continues to  be compliant on medications as prescribed.  Reports hydroxyzine is helpful.     Visit Diagnosis:    ICD-10-CM   1. MDD (major depressive disorder), recurrent, in full remission ([place])  F33.42   2. Social anxiety disorder  F40.10   3. Panic disorder  F41.0 hydrOXYzine (ATARAX/VISTARIL) 25 MG tablet    Past Psychiatric History: Reviewed past psychiatric history from my progress note on 06/05/2017.  Past trials of Zoloft, Wellbutrin.  Past Medical History:  Past Medical History:  Diagnosis Date  . Abnormal liver function test   . Anxiety   . Anxiety   . Arthritis   . Chronic lower back pain   . Degenerative joint disease   . Depression   . Fatty liver   . Heart murmur   . Hepatitis B   . Hyperlipidemia   . Hypertension   . Lumbar polyradiculopathy   . Panic attacks   . Polyneuropathy   . Vertigo 05/03/2017   ER    Past Surgical History:  Procedure Laterality Date  . COLONOSCOPY    . COLONOSCOPY WITH PROPOFOL N/A 10/01/2017   Procedure: COLONOSCOPY WITH PROPOFOL;  Surgeon: Toledo, Benay Pike, MD;  Location: [facility] ENDOSCOPY;  Service: Endoscopy;  Laterality: N/A;  . PROSTATE BIOPSY      Family Psychiatric History: Reviewed family psychiatric history from my progress note on 06/05/2017.  Family History:  Family History  Problem Relation Age of Onset  . Cancer Mother        Pancreatic  . Diabetes Mother   . Hypertension Mother   . Varicose Veins Mother   .  Alcohol abuse Mother   . Heart disease Father        CABG  . Cancer Father        Jaw  . Aortic aneurysm Father   . Congestive Heart Failure Father   . Hypertension Father   . Varicose Veins Father   . Alcohol abuse Father   . Bipolar disorder Sister   . Bipolar disorder Brother     Social History: Reviewed social history from my progress note on 06/05/2017. Social History   Socioeconomic History  . Marital status: Significant Other    Spouse name: denise  . Number of children: 0  . Years of education: Not on  file  . Highest education level: 11th grade  Occupational History    Comment: disabled  Tobacco Use  . Smoking status: Never Smoker  . Smokeless tobacco: Never Used  Substance and Sexual Activity  . Alcohol use: No  . Drug use: No  . Sexual activity: Not Currently  Other Topics Concern  . Not on file  Social History Narrative  . Not on file   Social Determinants of Health   Financial Resource Strain:   . Difficulty of Paying Living Expenses: Not on file  Food Insecurity:   . Worried About Programme researcher, broadcasting/film/video in the Last Year: Not on file  . Ran Out of Food in the Last Year: Not on file  Transportation Needs:   . Lack of Transportation (Medical): Not on file  . Lack of Transportation (Non-Medical): Not on file  Physical Activity:   . Days of Exercise per Week: Not on file  . Minutes of Exercise per Session: Not on file  Stress:   . Feeling of Stress : Not on file  Social Connections:   . Frequency of Communication with Friends and Family: Not on file  . Frequency of Social Gatherings with Friends and Family: Not on file  . Attends Religious Services: Not on file  . Active Member of Clubs or Organizations: Not on file  . Attends Banker Meetings: Not on file  . Marital Status: Not on file    Allergies:  Allergies  Allergen Reactions  . Lisinopril Cough    Metabolic Disorder Labs: Lab Results  Component Value Date   HGBA1C 6.1 08/16/2014   No results found for: PROLACTIN Lab Results  Component Value Date   CHOL 145 08/16/2014   TRIG 320 (A) 08/16/2014   HDL 36 08/16/2014   LDLCALC 45 08/16/2014   Lab Results  Component Value Date   TSH 1.61 08/16/2014    Therapeutic Level Labs: No results found for: LITHIUM No results found for: VALPROATE No components found for:  CBMZ  Current Medications: Current Outpatient Medications  Medication Sig Dispense Refill  . clotrimazole-betamethasone (LOTRISONE) cream Apply topically.    . diclofenac  sodium (VOLTAREN) 1 % GEL Apply topically as needed.     . hydrochlorothiazide (HYDRODIURIL) 25 MG tablet Take 25 mg by mouth daily.    . hydrOXYzine (ATARAX/VISTARIL) 25 MG tablet Take 0.5-1 tablets (12.5-25 mg total) by mouth 3 (three) times daily as needed. For severe anxiety 90 tablet 1  . naproxen (NAPROSYN) 500 MG tablet TK 1 T PO BID WC  3  . pravastatin (PRAVACHOL) 10 MG tablet Take 10 mg by mouth daily.    . QUEtiapine (SEROQUEL) 25 MG tablet TAKE 1 TABLET(25 MG) BY MOUTH AT BEDTIME FOR MOOD OR SLEEP 90 tablet 1  . valsartan (DIOVAN) 40 MG tablet  Take by mouth.    . valsartan (DIOVAN) 40 MG tablet Take 40 mg by mouth daily.    . Vilazodone HCl (VIIBRYD) 20 MG TABS TAKE 1 TABLET(20 MG) BY MOUTH DAILY 90 tablet 1   No current facility-administered medications for this visit.     Musculoskeletal: Strength & Muscle Tone: UTA Gait & Station: Reports as WNL Patient leans: N/A  Psychiatric Specialty Exam: Review of Systems  Psychiatric/Behavioral: The patient is nervous/anxious.   All other systems reviewed and are negative.   There were no vitals taken for this visit.There is no height or weight on file to calculate BMI.  General Appearance: UTA  Eye Contact:  UTA  Speech:  Clear and Coherent  Volume:  Normal  Mood:  Anxious coping ok  Affect:  UTA  Thought Process:  Goal Directed and Descriptions of Associations: Intact  Orientation:  Full (Time, Place, and Person)  Thought Content: Logical   Suicidal Thoughts:  No  Homicidal Thoughts:  No  Memory:  Immediate;   Fair Recent;   Fair Remote;   Fair  Judgement:  Fair  Insight:  Fair  Psychomotor Activity:  UTA  Concentration:  Concentration: Fair and Attention Span: Fair  Recall:  [employer] of Knowledge: Fair  Language: Fair  Akathisia:  No  Handed:  Right  AIMS (if indicated):Denies tremors, rigidity  Assets:  Communication Skills Desire for Improvement Housing Social Support  ADL's:  Intact  Cognition: WNL   Sleep:  Fair   Screenings: PHQ2-9     Office Visit from 02/03/2019 in [facility] Office Visit from 04/02/2018 in [facility] Procedure visit from 02[facility]25/2018 in [facility] Office Visit from 01/08/2018 in [facility] Office Visit from 07/03/2017 in [facility]  PHQ-2 Total Score  3  0  0  0  0  PHQ-9 Total Score  7  --  --  --  --       Assessment and Plan: Edinson is a 64 year old male to male transgender who has a history of depression, anxiety, insomnia was evaluated by phone today.  Patient is currently stable on current medication regimen.  Plan as noted below.  Plan Depression in remission Viibryd 20 mg p.o. daily Seroquel as prescribed  Anxiety disorder-stable Viibryd 20 mg p.o. daily Continue CBT . Hydroxyzine 25 mg p.o. 3 times daily as needed for anxiety attacks  Insomnia-stable Seroquel 12.5 to 25 mg p.o. nightly  Continue psychotherapy sessions with [name].  Follow-up in clinic in 6 to 8 weeks or sooner if needed.  April 9 at 10 AM  I have spent atleast 20 minutes non face to face with patient today. More than 50 % of the time was spent for  ordering medications and test ,psychoeducation and supportive psychotherapy and care coordination,as well as documenting clinical information in electronic health record. This note was generated in part or whole with voice recognition software. Voice recognition is usually quite accurate but there are transcription errors that can and very often do occur. I apologize for any typographical errors that were not detected and corrected.       Jomarie Longs, MD 05/28/2019, 12:24 PM

## 2019-06-03 DIAGNOSIS — R7303 Prediabetes: Secondary | ICD-10-CM | POA: Insufficient documentation

## 2019-06-08 ENCOUNTER — Ambulatory Visit (INDEPENDENT_AMBULATORY_CARE_PROVIDER_SITE_OTHER): Payer: Medicare Other | Admitting: Licensed Clinical Social Worker

## 2019-06-08 ENCOUNTER — Other Ambulatory Visit: Payer: Self-pay

## 2019-06-08 DIAGNOSIS — F640 Transsexualism: Secondary | ICD-10-CM | POA: Diagnosis not present

## 2019-06-08 DIAGNOSIS — F4010 Social phobia, unspecified: Secondary | ICD-10-CM

## 2019-06-08 DIAGNOSIS — F331 Major depressive disorder, recurrent, moderate: Secondary | ICD-10-CM | POA: Diagnosis not present

## 2019-06-08 NOTE — Progress Notes (Signed)
Virtual Visit via Telephone Note   I connected with Kyle Hood on 06/08/19 at 11:00am by telephone and verified that I am speaking with the correct person using two identifiers.   I discussed the limitations, risks, security and privacy concerns of performing an evaluation and management service by telephone and the availability of in person appointments. I also discussed with the patient that there may be a patient responsible charge related to this service. The patient expressed understanding and agreed to proceed.   I discussed the assessment and treatment plan with the patient. The patient was provided an opportunity to ask questions and all were answered. The patient agreed with the plan and demonstrated an understanding of the instructions.   The patient was advised to call back or seek an in-person evaluation if the symptoms worsen or if the condition fails to improve as anticipated.   I provided 1 hour of non-face-to-face time during this encounter.     Shade Flood, LCSW, LCAS _________________________ THERAPIST PROGRESS NOTE  Session Time: 11:00am - 12:00pm  Participation Level: Active  Behavioral Response: Alert, combined anxious/depressed mood  Type of Therapy:  Individual Therapy  Treatment Goals addressed: Medication compliance; Self-care routine; Anxiety management; Exercise/diet; Conflict resolution   Interventions: CBT, mindfulness   Summary: Kyle Hood reported that due to unreliable Internet service at home, a telephone session would be preferable.  Kyle Hood spoke in a manner that was alert, oriented x5, with no evidence or self-report of SI/HI or A/V H at this time.  Kyle Hood reported compliance with current medication and denied alcohol or substance abuse.  Kyle Hood reported scores of 7/10 for both depression and anxiety today.  Kyle Hood reported success in spending time for self-care each day working on [employer], taking walks around the block with partner for roughly 15 minutes  each day after dinner, trying to follow a low sugar and carb diet to improve health, listening to variety of music genres each day to improve mood, and attempting to adapt to problematic neighbor by ignoring their attempts to get a reaction.  Kyle Hood stated "The lady was hammering on the walls the other night to the point where it was causing the pictures to move, but we didn't give her any kind of reaction.  That's just what she would want so she could play the victim".  Kyle Hood also reported that this woman has tried to turn the other neighbors against them but they effectively ignore her and hope eventually she will tire of this.  Kyle Hood reported also spending time feeding crows outdoors and studying their behavior since this has been an interesting pastime and calming.  Kyle Hood was receptive to trying mindful breathing exercise today to aid in relaxation and stress reduction.  Kyle Hood participated in exercise and reported that this was surprisingly easy and led to a clearer mind and improved mood.  Kyle Hood reported intent to follow up in 1 month.    Suicidal/Homicidal: None, without plan or intent.   Therapist Response: Clinician contacted Kyle Hood by phone today due to inability for client to access Webex.  Clinician assessed for safety and sobriety.  Clinician inquired about emotional ratings at this time, as well as significant changes in thoughts, feelings, or behavior since assessment was completed.  Clinician revisited treatment goals with Kyle Hood to identify where progress is being made, as well as address barriers.  Clinician praised Kyle Hood for progress noted at this time and encouraged maintaining firm boundaries with neighbor due to ongoing issues with their behavior.  Clinician inquired  about whether Kyle Hood would like to try mindfulness technique today to add to skillset for improved coping.  Clinician invited Kyle Hood to get comfortable, find relaxing breathing rhythm, and focus on this for several minutes, allowing thoughts  and feelings to arise and then pass without ruminating on them with goal of staying in present moment, undisturbed.  Clinician inquired about effectiveness of exercise afterward, and encouraged at least 5-10 minutes practice daily.  Clinician will continue to monitor.     Plan: Follow up again in 1 month.    Diagnosis: Social Anxiety Disorder; Major Depressive Disorder, recurrent, moderate; and Gender Dysphoria  Noralee Stain, LCSW, LCAS 06/08/19

## 2019-07-06 ENCOUNTER — Other Ambulatory Visit: Payer: Self-pay

## 2019-07-06 ENCOUNTER — Ambulatory Visit (INDEPENDENT_AMBULATORY_CARE_PROVIDER_SITE_OTHER): Payer: Medicare Other | Admitting: Licensed Clinical Social Worker

## 2019-07-06 DIAGNOSIS — F640 Transsexualism: Secondary | ICD-10-CM

## 2019-07-06 DIAGNOSIS — F4010 Social phobia, unspecified: Secondary | ICD-10-CM

## 2019-07-06 DIAGNOSIS — F331 Major depressive disorder, recurrent, moderate: Secondary | ICD-10-CM | POA: Diagnosis not present
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
## 2019-08-03 ENCOUNTER — Ambulatory Visit (INDEPENDENT_AMBULATORY_CARE_PROVIDER_SITE_OTHER): Payer: Medicare Other | Admitting: Licensed Clinical Social Worker

## 2019-08-03 ENCOUNTER — Other Ambulatory Visit: Payer: Self-pay

## 2019-08-03 DIAGNOSIS — F331 Major depressive disorder, recurrent, moderate: Secondary | ICD-10-CM | POA: Diagnosis not present

## 2019-08-03 DIAGNOSIS — F640 Transsexualism: Secondary | ICD-10-CM

## 2019-08-03 DIAGNOSIS — F4010 Social phobia, unspecified: Secondary | ICD-10-CM

## 2019-08-03 NOTE — Progress Notes (Signed)
Virtual Visit via Telephone Note   I connected with [name] on 08/03/19 at 11:00am by telephone and verified that I am speaking with the correct person using two identifiers.   I discussed the limitations, risks, security and privacy concerns of performing an evaluation and management service by telephone and the availability of in person appointments. I also discussed with the patient that there may be a patient responsible charge related to this service. The patient expressed understanding and agreed to proceed.   I discussed the assessment and treatment plan with the patient. The patient was provided an opportunity to ask questions and all were answered. The patient agreed with the plan and demonstrated an understanding of the instructions.   The patient was advised to call back or seek an in-person evaluation if the symptoms worsen or if the condition fails to improve as anticipated.   I provided 45 minutes of non-face-to-face time during this encounter.     Noralee Stain, LCSW, LCAS _________________________ THERAPIST PROGRESS NOTE   Session Time: 11:00am - 11:45am   Participation Level: Active   Behavioral Response: Alert, combined anxious/depressed mood   Type of Therapy:  Individual Therapy   Treatment Goals addressed: Medication compliance; Self-care routine; Anxiety management; Exercise/diet; Conflict resolution    Interventions: CBT, treatment planning    Summary: Arif is a 64 year old Caucasian transgender male to male that presented for telephone therapy session today with Social anxiety disorder; Major depressive disorder, recurrent, moderate; and Gender dysphoria.     Suicidal/Homicidal: None; without plan or intent.    Therapist Response: Clinician contacted [name] by phone today due to inability for her to access Webex at this time.  Clinician assessed for safety and sobriety.  Bartley spoke in a manner that was alert, oriented x5, with no evidence of SI/HI or A/V H.   Theodoros reported that she is taking medication as prescribed and denied any alcohol or illicit substance use.  Clinician inquired about Abdoulaye's emotional ratings today, as well as significant changes in thoughts, feelings, or behavior since last conversation.  Ryota reported scores of 6/10 for depression and 7/10 for anxiety, stating "I think I've been more anxious because of this spot on my face".  Jeanpaul reported that a discolored spot appeared on her face recently, but she plans to have this checked out by a doctor in upcoming weeks.  Jordie reported that this has also led to a slight increase in depression due to feeling like health issues will continue to appear with age.  Clinician empathized with Alphonzo and encouraged her to try to focus on points of gratitude in life rather than resentments which could diminish positive outlook on future.  Clinician also inquired about progress Euan has made towards her treatment goals in recent weeks, as well as any additional challenges.  Shahrukh reported that other than the skin concerns, she has stayed productive with self-care, including cleaning/organizing the home, going on walks daily for exercise, using relaxation techniques frequently to stay calm, and planning to begin a new painting soon.  Mannix reported that she has had less conflict with neighbors recently, and the woman who has caused the most issues even complimented Decorey on her outfit as she was walking by the other day.  Ruven reported that she tends to ignore people or things around her that she dislikes, and was ignoring her that day, so she did not hear this woman, and was unaware of what happened until she got home and her partner pointed this out.  Braeden reported that she went next door and apologized and gave them a gift as an 'olive branch' of peace.  Clinician praised Lamario for this decision and encouraged her to continue working to improve relationships with her neighbors, as this would likely reduce overall stress  and social anxiety related to environment at home.  Clinician also noted that 90 days had passed since treatment began, and suggested revisiting treatment plan at this time to make revisions/updates as needed.  Berlin was agreeable to this and collaborated in treatment planning to make following plan, which needed few adjustments: Meet with clinician virtually once per month for therapy to address progress and needs; Meet with psychiatrist once every 2 months to address efficacy of medication and make adjustments as needed to regimen and/or dosage; Take medications daily as prescribed to reduce symptoms and improve overall daily functioning; Reduce depression from average severity of 7/10 to 4/10 in the next 90 days by engaging in 2-3 hours per day of positive self care activities such as painting, arranging dolls collection, flower arrangements, reading, and/or watching movies; Reduce anxiety from average severity of 6/10 to 3/10 in next 90 days by exploring and utilizing relaxation techniques such as deep breathing, meditation and/or progressive muscle relaxation 2-3 times per day; Exercise once per day by engaging in walking around neighborhood for at least 15 minutes each time, in addition to following healthier diet each week in order to improve both physical and mental wellbeing; Work closely with therapist to improve communication skills in order to resolve conflict in social situations, particularly with triggering interactions with neighbors.  Brier was agreeable to revisiting this again once 90 more days have passed.  Clinician inquired about Eliud's plans for this week to continue improving mood and productivity day to day.  Yakov reported that she would continue practicing mood management and relaxation skills each day to stay grounded, begin work on new painting once materials are gathered and subject is determined, and also continue daily walks to maintain health.  Clinician will continue to monitor.       Plan: Follow up again in 1 month.    Diagnosis: Social Anxiety Disorder; Major Depressive Disorder, recurrent, moderate; and Gender Dysphoria   Shade Flood, LCSW, LCAS 08/03/19

## 2019-08-06 ENCOUNTER — Ambulatory Visit (INDEPENDENT_AMBULATORY_CARE_PROVIDER_SITE_OTHER): Payer: Medicare Other | Admitting: Psychiatry

## 2019-08-06 ENCOUNTER — Encounter: Payer: Self-pay | Admitting: Psychiatry

## 2019-08-06 ENCOUNTER — Other Ambulatory Visit: Payer: Self-pay

## 2019-08-06 DIAGNOSIS — F3342 Major depressive disorder, recurrent, in full remission: Secondary | ICD-10-CM | POA: Diagnosis not present

## 2019-08-06 DIAGNOSIS — F4010 Social phobia, unspecified: Secondary | ICD-10-CM

## 2019-08-06 DIAGNOSIS — F410 Panic disorder [episodic paroxysmal anxiety] without agoraphobia: Secondary | ICD-10-CM | POA: Diagnosis not present

## 2019-08-06 NOTE — Progress Notes (Signed)
Provider Location : [facility] Patient Location : Home  Virtual Visit via Telephone Note  I connected with Kyle Hood on 08/06/19 at 10:00 AM EDT by telephone and verified that I am speaking with the correct person using two identifiers.   I discussed the limitations, risks, security and privacy concerns of performing an evaluation and management service by telephone and the availability of in person appointments. I also discussed with the patient that there may be a patient responsible charge related to this service. The patient expressed understanding and agreed to proceed.    I discussed the assessment and treatment plan with the patient. The patient was provided an opportunity to ask questions and all were answered. The patient agreed with the plan and demonstrated an understanding of the instructions.   The patient was advised to call back or seek an in-person evaluation if the symptoms worsen or if the condition fails to improve as anticipated.   [facility] MD OP Progress Note  08/06/2019 12:26 PM Kyle Hood  MRN:  [id number]  Chief Complaint:  Chief Complaint    Follow-up     HPI: Kyle Hood is a 64 year old transgender male to male, lives in [place], has a history of MDD, panic attack, social anxiety disorder was evaluated by phone today.  Patient preferred to do a phone call.  Patient today reports they are doing well.  Patient is currently compliant on medication regimen.  Denies side effects.  Reports appetite as fair.  Reports they are trying to lose weight and is currently exercising regularly.  Patient does report pain due to possible arthritis.  Reports taking naproxen.  Reports sleep hence is restless on and off however overall is coping okay.  Denies suicidality, homicidality or perceptual disturbances.  Continues to follow-up with therapist Mr. Kyle Hood and reports therapy sessions as going well.   Visit Diagnosis:    ICD-10-CM   1. MDD (major depressive  disorder), recurrent, in full remission ([place])  F33.42   2. Social anxiety disorder  F40.10   3. Panic disorder  F41.0     Past Psychiatric History: I have reviewed past psychiatric history from my progress note on 06/05/2017.  Past trials of Zoloft, Wellbutrin.  Past Medical History:  Past Medical History:  Diagnosis Date  . Abnormal liver function test   . Anxiety   . Anxiety   . Arthritis   . Chronic lower back pain   . Degenerative joint disease   . Depression   . Fatty liver   . Heart murmur   . Hepatitis B   . Hyperlipidemia   . Hypertension   . Lumbar polyradiculopathy   . Panic attacks   . Polyneuropathy   . Vertigo 05/03/2017   ER    Past Surgical History:  Procedure Laterality Date  . COLONOSCOPY    . COLONOSCOPY WITH PROPOFOL N/A 10/01/2017   Procedure: COLONOSCOPY WITH PROPOFOL;  Surgeon: Toledo, Benay Pike, MD;  Location: [facility] ENDOSCOPY;  Service: Endoscopy;  Laterality: N/A;  . PROSTATE BIOPSY      Family Psychiatric History: I have reviewed past psychiatric history from my progress note on 06/05/2017.  Past trials of Zoloft, Wellbutrin  Family History:  Family History  Problem Relation Age of Onset  . Cancer Mother        Pancreatic  . Diabetes Mother   . Hypertension Mother   . Varicose Veins Mother   . Alcohol abuse Mother   . Heart disease Father  CABG  . Cancer Father        Jaw  . Aortic aneurysm Father   . Congestive Heart Failure Father   . Hypertension Father   . Varicose Veins Father   . Alcohol abuse Father   . Bipolar disorder Sister   . Bipolar disorder Brother     Social History: I have reviewed social history from my progress note on 06/05/2017 Social History   Socioeconomic History  . Marital status: Significant Other    Spouse name: Kyle Hood  . Number of children: 0  . Years of education: Not on file  . Highest education level: 11th grade  Occupational History    Comment: disabled  Tobacco Use  . Smoking status: Never  Smoker  . Smokeless tobacco: Never Used  Substance and Sexual Activity  . Alcohol use: No  . Drug use: No  . Sexual activity: Not Currently  Other Topics Concern  . Not on file  Social History Narrative  . Not on file   Social Determinants of Health   Financial Resource Strain:   . Difficulty of Paying Living Expenses:   Food Insecurity:   . Worried About Programme researcher, broadcasting/film/video in the Last Year:   . Barista in the Last Year:   Transportation Needs:   . Freight forwarder (Medical):   Marland Kitchen Lack of Transportation (Non-Medical):   Physical Activity:   . Days of Exercise per Week:   . Minutes of Exercise per Session:   Stress:   . Feeling of Stress :   Social Connections:   . Frequency of Communication with Friends and Family:   . Frequency of Social Gatherings with Friends and Family:   . Attends Religious Services:   . Active Member of Clubs or Organizations:   . Attends Banker Meetings:   Marland Kitchen Marital Status:     Allergies:  Allergies  Allergen Reactions  . Lisinopril Cough    Metabolic Disorder Labs: Lab Results  Component Value Date   HGBA1C 6.1 08/16/2014   No results found for: PROLACTIN Lab Results  Component Value Date   CHOL 145 08/16/2014   TRIG 320 (A) 08/16/2014   HDL 36 08/16/2014   LDLCALC 45 08/16/2014   Lab Results  Component Value Date   TSH 1.61 08/16/2014    Therapeutic Level Labs: No results found for: LITHIUM No results found for: VALPROATE No components found for:  CBMZ  Current Medications: Current Outpatient Medications  Medication Sig Dispense Refill  . clotrimazole-betamethasone (LOTRISONE) cream Apply topically.    . diclofenac sodium (VOLTAREN) 1 % GEL Apply topically as needed.     . hydrochlorothiazide (HYDRODIURIL) 25 MG tablet Take 25 mg by mouth daily.    . hydrOXYzine (ATARAX/VISTARIL) 25 MG tablet Take 0.5-1 tablets (12.5-25 mg total) by mouth 3 (three) times daily as needed. For severe anxiety 90  tablet 1  . naproxen (NAPROSYN) 500 MG tablet TK 1 T PO BID WC  3  . pravastatin (PRAVACHOL) 10 MG tablet Take 10 mg by mouth daily.    . QUEtiapine (SEROQUEL) 25 MG tablet TAKE 1 TABLET(25 MG) BY MOUTH AT BEDTIME FOR MOOD OR SLEEP 90 tablet 1  . valsartan (DIOVAN) 40 MG tablet Take by mouth.    . valsartan (DIOVAN) 40 MG tablet Take 40 mg by mouth daily.    . Vilazodone HCl (VIIBRYD) 20 MG TABS TAKE 1 TABLET(20 MG) BY MOUTH DAILY 90 tablet 1   No current  facility-administered medications for this visit.     Musculoskeletal: Strength & Muscle Tone: UTA Gait & Station: UTA Patient leans: N/A  Psychiatric Specialty Exam: Review of Systems  Psychiatric/Behavioral: Positive for sleep disturbance.  All other systems reviewed and are negative.   There were no vitals taken for this visit.There is no height or weight on file to calculate BMI.  General Appearance: UTA  Eye Contact:  UTA  Speech:  Clear and Coherent  Volume:  Normal  Mood:  Euthymic  Affect:  UTA  Thought Process:  Goal Directed and Descriptions of Associations: Intact  Orientation:  Full (Time, Place, and Person)  Thought Content: Logical   Suicidal Thoughts:  No  Homicidal Thoughts:  No  Memory:  Immediate;   Fair Recent;   Fair Remote;   Fair  Judgement:  Fair  Insight:  Fair  Psychomotor Activity:  UTA  Concentration:  Concentration: Fair and Attention Span: Fair  Recall:  [employer] of Knowledge: Fair  Language: Fair  Akathisia:  No  Handed:  Right  AIMS (if indicated): UTA  Assets:  Communication Skills Desire for Improvement Housing Social Support  ADL's:  Intact  Cognition: WNL  Sleep:  Restless due to pain   Screenings: PHQ2-9     [facility] Visit from 02/03/2019 in [facility] Office Visit from 04/02/2018 in [facility] Procedure visit from 02/25/2018 in [facility] Office Visit from  01/08/2018 in [facility] Office Visit from 07/03/2017 in [facility]  PHQ-2 Total Score  3  0  0  0  0  PHQ-9 Total Score  7  --  --  --  --       Assessment and Plan: Sharon is a 64 year old male to male transgender who has a history of depression, anxiety, insomnia was evaluated by phone today.  Patient is currently stable on current medication regimen.  Does report sleep issues likely due to pain.  We will monitor closely.  Plan as noted below.  Plan Depression in remission Viibryd 20 mg p.o. daily Seroquel as prescribed  Anxiety disorder-stable Continue CBT with [name] Hydroxyzine 25 mg p.o. 3 times daily as needed for anxiety attacks  Insomnia-restless due to pain Patient advised to contact primary care provider for pain management. Seroquel 12.5 to 25 mg p.o. nightly  Continue psychotherapy sessions as needed  Follow-up in clinic in 2 to 3 months or sooner if needed.  I have spent atleast 19 minutes non face to face with patient today. More than 50 % of the time was spent for preparing to see the patient ( e.g., review of test, records ), ordering medications and test ,psychoeducation and supportive psychotherapy and care coordination,as well as documenting clinical information in electronic health record. This note was generated in part or whole with voice recognition software. Voice recognition is usually quite accurate but there are transcription errors that can and very often do occur. I apologize for any typographical errors that were not detected and corrected.        Jomarie Longs, MD 08/06/2019, 12:26 PM

## 2019-08-31 ENCOUNTER — Ambulatory Visit (INDEPENDENT_AMBULATORY_CARE_PROVIDER_SITE_OTHER): Payer: Medicare Other | Admitting: Licensed Clinical Social Worker

## 2019-08-31 ENCOUNTER — Other Ambulatory Visit: Payer: Self-pay

## 2019-08-31 DIAGNOSIS — F640 Transsexualism: Secondary | ICD-10-CM | POA: Diagnosis not present

## 2019-08-31 DIAGNOSIS — F331 Major depressive disorder, recurrent, moderate: Secondary | ICD-10-CM

## 2019-08-31 DIAGNOSIS — F4010 Social phobia, unspecified: Secondary | ICD-10-CM

## 2019-08-31 NOTE — Progress Notes (Signed)
Virtual Visit via Telephone Note   I connected with Kyle Hood on 08/31/19 at 11:00am by telephone and verified that I am speaking with the correct person using two identifiers.   I discussed the limitations, risks, security and privacy concerns of performing an evaluation and management service by telephone and the availability of in person appointments. I also discussed with the patient that there may be a patient responsible charge related to this service. The patient expressed understanding and agreed to proceed.   I discussed the assessment and treatment plan with the patient. The patient was provided an opportunity to ask questions and all were answered. The patient agreed with the plan and demonstrated an understanding of the instructions.   The patient was advised to call back or seek an in-person evaluation if the symptoms worsen or if the condition fails to improve as anticipated.   I provided 30 minutes of non-face-to-face time during this encounter.     Shade Flood, LCSW, LCAS _________________________ THERAPIST PROGRESS NOTE   Session Time: 11:00am - 11:30am   Participation Level: Active   Behavioral Response: Alert, combined anxious/depressed mood   Type of Therapy:  Individual Therapy   Treatment Goals addressed: Medication compliance; Self-care routine; Anxiety management; Exercise/diet; Conflict resolution    Interventions: CBT   Summary: Kyle Hood is a 64 year old Caucasian transgender male to male that presented for telephone therapy session today with Social anxiety disorder; Major depressive disorder, recurrent, moderate; and Gender dysphoria.     Suicidal/Homicidal: None; without plan or intent.    Therapist Response: Clinician contacted Kyle Hood by phone today due to ongoing inability for her to access video-enabled applications at this time.  Clinician assessed for safety and sobriety.  Kyle Hood spoke in a manner that was alert, oriented x5, with no evidence of SI/HI or  A/V H.  Kyle Hood reported that she continues to take medication as prescribed and denied use of alcohol or illicit substances.  Clinician inquired about Kyle Hood current emotional ratings, as well as significant changes in thoughts, feelings, or behavior since previous session.  Kyle Hood reported scores of 6/10 for both depression and anxiety, stating "My anxiety was a lot higher recently but its getting better".  Clinician inquired about progress Kyle Hood has made towards goals recently, as well as present challenges she is facing.  Kyle Hood reported that she met with psychiatrist in past month and did not need to make any changes to regimen.  Kyle Hood reported that she continues taking time for self-care each week such as listening to music or painting.  Kyle Hood reported that she has been using breathing technique to manage anxiety and avoid panic episodes when anxiety is high.  Kyle Hood reported that she also continues walking around the apartment complex each day to maintain physical health.  Kyle Hood reported that her present concern influencing anxiety was learning that a neighbor she had just recently begun to get along with is leaving, and a new person will be moving in.  Kyle Hood reported that she worries that this will be a worse arrangement than before, and has had ruminating thoughts about what might happen.  Clinician went over a handout with Kyle Hood today titled 'Worry exploration' and explained how it is easy to imagine the worst possible outcome when faced with anxieties about a particular situation, but in reality, what could happen isn't always the same as what will happen.  Clinician guided Kyle Hood through a series of questions to reduce related anxiety, such as identifying clues that might suggest the worry will not  come true, what could happen if this outcome does not occur, and how she might handle the situation even if the worst possible outcome becomes a reality.  Kyle Hood was receptive to exercise and noted that she has no real evidence  to go by which supports this fear, and stated "It all remains to be seen.  They might not even be so bad".  Kyle Hood reported that this put them more at ease about the future, so clinician encouraged Kyle Hood to utilize a similar cognitive approach when faced with future worries to reduce prominence of anxiety and increase coping ability when faced with unexpected changes.  Clinician will continue to monitor.      Plan: Follow up again in 1 month.    Diagnosis: Social Anxiety Disorder; Major Depressive Disorder, recurrent, moderate; and Gender Dysphoria   Shade Flood, LCSW, LCAS 08/31/19

## 2019-09-06 ENCOUNTER — Ambulatory Visit: Payer: Medicare Other | Admitting: Dermatology

## 2019-09-28 ENCOUNTER — Ambulatory Visit (INDEPENDENT_AMBULATORY_CARE_PROVIDER_SITE_OTHER): Payer: Medicare Other | Admitting: Licensed Clinical Social Worker

## 2019-09-28 ENCOUNTER — Other Ambulatory Visit: Payer: Self-pay

## 2019-09-28 DIAGNOSIS — F331 Major depressive disorder, recurrent, moderate: Secondary | ICD-10-CM | POA: Diagnosis not present

## 2019-09-28 DIAGNOSIS — F640 Transsexualism: Secondary | ICD-10-CM

## 2019-09-28 DIAGNOSIS — F4010 Social phobia, unspecified: Secondary | ICD-10-CM | POA: Diagnosis not present

## 2019-09-28 NOTE — Progress Notes (Signed)
Virtual Visit via Telephone Note   I connected with Kyle Hood on 09/28/19 at 11:00am by telephone and verified that I am speaking with the correct person using two identifiers.   I discussed the limitations, risks, security and privacy concerns of performing an evaluation and management service by telephone and the availability of in person appointments. I also discussed with the patient that there may be a patient responsible charge related to this service. The patient expressed understanding and agreed to proceed.   I discussed the assessment and treatment plan with the patient. The patient was provided an opportunity to ask questions and all were answered. The patient agreed with the plan and demonstrated an understanding of the instructions.   The patient was advised to call back or seek an in-person evaluation if the symptoms worsen or if the condition fails to improve as anticipated.   I provided 45 minutes of non-face-to-face time during this encounter.     Kyle Stain, Kyle Hood, Kyle Hood _________________________ THERAPIST PROGRESS NOTE   Session Time: 11:00am - 11:45am  Location: Patient: Patient Home Provider: [facility]    Participation Level: Active   Behavioral Response: Alert, combined anxious/depressed mood   Type of Therapy:  Individual Therapy   Treatment Goals addressed: Medication compliance; Self-care routine; Anxiety management; Exercise/diet; Conflict resolution    Interventions: CBT, assertive communication skills   Summary: Kyle Hood is a 64 year old Caucasian transgender male to male that presented for telephone therapy session today with Social anxiety disorder; Major depressive disorder, recurrent, moderate; and Gender dysphoria.     Suicidal/Homicidal: None; without plan or intent.    Therapist Response: Clinician contacted Kyle Hood by phone today due to ongoing inability for her to access video-enabled applications at this time.  Clinician assessed for safety  and sobriety.  Kyle Hood spoke in a manner that was alert, oriented x5, with no evidence of SI/HI or A/V H.  Kyle Hood reported ongoing compliance with medication at this time, and denied use of alcohol or illicit substances.  Clinician inquired about Kyle Hood emotional ratings today, as well as significant changes in thoughts, feelings, or behavior since last conversation.  Kyle Hood reported scores of 8/10 for both depression and anxiety, stating "The neighbors have really been driving me crazy". Kyle Hood reported that she has been having panic attacks x1 weekly on average due to ongoing issues with neighbors, including new ones that moved in and have been letting their dog relieve itself in the walkway without cleaning after themselves.  Kyle Hood reported that taking her antianxiety medication has provided some relief, but she needed someone else to process these issues with as well, noting that depression has increased recently, and she was sleeping much of the day as an escape.  Clinician discussed assertive communication skills with Kyle Hood today, including the importance of respecting yourself by expressing wants and needs clearly and calmly, planning ahead to determine how to address a problem with someone, and practicing saying "No" to unreasonable situations, and exploring alternative solutions with the other person.  Clinician discussed examples of assertive responses to give Kyle Hood ideas on how to speak with this person about the issue should they witness it occur again, highlighting need to express feelings without blaming, and describing needs clearly.  Kyle Hood was receptive to this topic, and noted that "Just being able to talk about it is helpful, since I don't have anyone to talk to besides my partner".  Kyle Hood reported that she would plan a response for this neighbor should the issue occur again, and  if they cannot resolve it amicably, she will go to the front office with evidence for support.  Clinician inquired about Kyle Hood  self-care routine for following week to work towards lifting mood and improving outlook.  Kyle Hood reported that she would try to get back into routine of getting up early, going on walks for exercise and cooking healthy, watching TV to distract herself within reason, and painting.  She also reported seeing a movie in theater with partner in past week, and noted that it has been helpful to get out more and 'people watch', since she has felt trapped during pandemic.  Kyle Hood denied having any other issues to discuss today and thanked clinician for advice and time provided, agreeing to follow up in 1 month.  Clinician will continue to monitor.      Plan: Follow up again in 1 month.    Diagnosis: Social Anxiety Disorder; Major Depressive Disorder, recurrent, moderate; and Gender Dysphoria   Kyle Flood, Kyle Hood, Kyle Hood 09/28/19

## 2019-10-22 ENCOUNTER — Other Ambulatory Visit: Payer: Self-pay

## 2019-10-22 ENCOUNTER — Encounter: Payer: Self-pay | Admitting: Psychiatry

## 2019-10-22 ENCOUNTER — Telehealth (INDEPENDENT_AMBULATORY_CARE_PROVIDER_SITE_OTHER): Payer: Medicare Other | Admitting: Psychiatry

## 2019-10-22 DIAGNOSIS — F410 Panic disorder [episodic paroxysmal anxiety] without agoraphobia: Secondary | ICD-10-CM

## 2019-10-22 DIAGNOSIS — F3342 Major depressive disorder, recurrent, in full remission: Secondary | ICD-10-CM

## 2019-10-22 DIAGNOSIS — F4010 Social phobia, unspecified: Secondary | ICD-10-CM | POA: Diagnosis not present

## 2019-10-22 MED ORDER — QUETIAPINE FUMARATE 25 MG PO TABS: ORAL_TABLET | ORAL | 1 refills | Status: DC

## 2019-10-22 MED ORDER — VIIBRYD 20 MG PO TABS: ORAL_TABLET | ORAL | 1 refills | Status: DC

## 2019-10-22 NOTE — Progress Notes (Signed)
Provider Location : [facility] Patient Location : Home  Virtual Visit via Telephone Note  I connected with Kyle Hood on 10/22/19 at 10:00 AM EDT by telephone and verified that I am speaking with the correct person using two identifiers.   I discussed the limitations, risks, security and privacy concerns of performing an evaluation and management service by telephone and the availability of in person appointments. I also discussed with the patient that there may be a patient responsible charge related to this service. The patient expressed understanding and agreed to proceed.    I discussed the assessment and treatment plan with the patient. The patient was provided an opportunity to ask questions and all were answered. The patient agreed with the plan and demonstrated an understanding of the instructions.   The patient was advised to call back or seek an in-person evaluation if the symptoms worsen or if the condition fails to improve as anticipated.   [facility] Progress Note  10/22/2019 10:13 AM Kyle Hood  MRN:  [id number]  Chief Complaint:  Chief Complaint    Follow-up     HPI: Kyle Hood is a 64 year old transgender male to [facility] in [place], has history of MDD, panic attacks  Social anxiety disorder, was evaluated by phone today.  Patient today reports they are currently anxious about their living situation.  They have a new neighbor and reports they do not like her personality. They however reports she is quiet and that does help a lot.  They are trying to distract themselves.  Patient reports they have started painting and that does relax them.  Patient continues to follow-up with therapist Mr. Chryl Heck.  Therapy sessions are beneficial.  Patient reports sleep and appetite as fair.  Patient reports recently they had possibly a vivid dream versus hallucination in the middle of the night when they saw a spider-tarantula.  Patient however does not know if they were  still asleep when this happened or not.This did not affect their sleep much.  Patient continues to be compliant on the Seroquel and the Viibryd.  Denies side effects.  Patient continues to have good support system from their partner.  Patient denies any other concerns today.    Visit Diagnosis:    ICD-10-CM   1. MDD (major depressive disorder), recurrent, in full remission ([place])  F33.42 QUEtiapine (SEROQUEL) 25 MG tablet  2. Social anxiety disorder  F40.10 Vilazodone HCl (VIIBRYD) 20 MG TABS  3. Panic disorder  F41.0 QUEtiapine (SEROQUEL) 25 MG tablet    Past Psychiatric History: I have reviewed past psychiatric history from my progress note on 06/05/2017.  Past trials of Zoloft, Wellbutrin.  Past Medical History:  Past Medical History:  Diagnosis Date  . Abnormal liver function test   . Anxiety   . Anxiety   . Arthritis   . Chronic lower back pain   . Degenerative joint disease   . Depression   . Fatty liver   . Heart murmur   . Hepatitis B   . Hyperlipidemia   . Hypertension   . Lumbar polyradiculopathy   . Panic attacks   . Polyneuropathy   . Vertigo 05/03/2017   ER    Past Surgical History:  Procedure Laterality Date  . COLONOSCOPY    . COLONOSCOPY WITH PROPOFOL N/A 10/01/2017   Procedure: COLONOSCOPY WITH PROPOFOL;  Surgeon: Toledo, Benay Pike, MD;  Location: [facility] ENDOSCOPY;  Service: Endoscopy;  Laterality: N/A;  . PROSTATE BIOPSY  Family Psychiatric History: I have reviewed past psychiatric history from my progress note on 06/05/2017.  Past trials of Zoloft, Wellbutrin.  Family History:  Family History  Problem Relation Age of Onset  . Cancer Mother        Pancreatic  . Diabetes Mother   . Hypertension Mother   . Varicose Veins Mother   . Alcohol abuse Mother   . Heart disease Father        CABG  . Cancer Father        Jaw  . Aortic aneurysm Father   . Congestive Heart Failure Father   . Hypertension Father   . Varicose Veins Father   . Alcohol  abuse Father   . Bipolar disorder Sister   . Bipolar disorder Brother     Social History: I have reviewed social history from my progress note on 06/05/2017. Social History   Socioeconomic History  . Marital status: Significant Other    Spouse name: denise  . Number of children: 0  . Years of education: Not on file  . Highest education level: 11th grade  Occupational History    Comment: disabled  Tobacco Use  . Smoking status: Never Smoker  . Smokeless tobacco: Never Used  Vaping Use  . Vaping Use: Never used  Substance and Sexual Activity  . Alcohol use: No  . Drug use: No  . Sexual activity: Not Currently  Other Topics Concern  . Not on file  Social History Narrative  . Not on file   Social Determinants of Health   Financial Resource Strain:   . Difficulty of Paying Living Expenses:   Food Insecurity:   . Worried About Programme researcher, broadcasting/film/video in the Last Year:   . Barista in the Last Year:   Transportation Needs:   . Freight forwarder (Medical):   Marland Kitchen Lack of Transportation (Non-Medical):   Physical Activity:   . Days of Exercise per Week:   . Minutes of Exercise per Session:   Stress:   . Feeling of Stress :   Social Connections:   . Frequency of Communication with Friends and Family:   . Frequency of Social Gatherings with Friends and Family:   . Attends Religious Services:   . Active Member of Clubs or Organizations:   . Attends Banker Meetings:   Marland Kitchen Marital Status:     Allergies:  Allergies  Allergen Reactions  . Lisinopril Cough    Metabolic Disorder Labs: Lab Results  Component Value Date   HGBA1C 6.1 08/16/2014   No results found for: PROLACTIN Lab Results  Component Value Date   CHOL 145 08/16/2014   TRIG 320 (A) 08/16/2014   HDL 36 08/16/2014   LDLCALC 45 08/16/2014   Lab Results  Component Value Date   TSH 1.61 08/16/2014    Therapeutic Level Labs: No results found for: LITHIUM No results found for:  VALPROATE No components found for:  CBMZ  Current Medications: Current Outpatient Medications  Medication Sig Dispense Refill  . clotrimazole-betamethasone (LOTRISONE) cream Apply topically.    . diclofenac sodium (VOLTAREN) 1 % GEL Apply topically as needed.     . hydrochlorothiazide (HYDRODIURIL) 25 MG tablet Take 25 mg by mouth daily.    . hydrOXYzine (ATARAX/VISTARIL) 25 MG tablet Take 0.5-1 tablets (12.5-25 mg total) by mouth 3 (three) times daily as needed. For severe anxiety 90 tablet 1  . naproxen (NAPROSYN) 500 MG tablet TK 1 T PO BID WC  3  . pravastatin (PRAVACHOL) 10 MG tablet Take 10 mg by mouth daily.    . QUEtiapine (SEROQUEL) 25 MG tablet TAKE 1 TABLET(25 MG) BY MOUTH AT BEDTIME FOR MOOD OR SLEEP 90 tablet 1  . valsartan (DIOVAN) 40 MG tablet Take by mouth.    . valsartan (DIOVAN) 40 MG tablet Take 40 mg by mouth daily.    . Vilazodone HCl (VIIBRYD) 20 MG TABS TAKE 1 TABLET(20 MG) BY MOUTH DAILY 90 tablet 1   No current facility-administered medications for this visit.     Musculoskeletal: Strength & Muscle Tone: UTA Gait & Station: UTA Patient leans: N/A  Psychiatric Specialty Exam: Review of Systems  Psychiatric/Behavioral: The patient is nervous/anxious.   All other systems reviewed and are negative.   There were no vitals taken for this visit.There is no height or weight on file to calculate BMI.  General Appearance: UTA  Eye Contact:  Fair  Speech:  Clear and Coherent  Volume:  Normal  Mood:  Anxious coping well  Affect:  UTA  Thought Process:  Goal Directed and Descriptions of Associations: Intact  Orientation:  Full (Time, Place, and Person)  Thought Content: Logical   Suicidal Thoughts:  No  Homicidal Thoughts:  No  Memory:  Immediate;   Fair Recent;   Fair Remote;   Fair  Judgement:  Fair  Insight:  Fair  Psychomotor Activity:  UTA  Concentration:  Concentration: Fair and Attention Span: Fair  Recall:  [employer] of Knowledge: Fair   Language: Fair  Akathisia:  No  Handed:  Right  AIMS (if indicated):UTA  Assets:  Communication Skills Desire for Improvement Housing Social Support  ADL's:  Intact  Cognition: WNL  Sleep:  Fair   Screenings: PHQ2-9     Office Visit from 02/03/2019 [facility] Office Visit from 04/02/2018 in [facility] Procedure visit from 02/25/2018 in [facility] Office Visit from 01/08/2018 in [facility] Office Visit from 07[facility]03/2017 in [facility]  PHQ-2 Total Score 3 0 0 0 0  PHQ-9 Total Score 7 -- -- -- --       Assessment and Plan: Casson Catena is a 64 year old male to male transgender who has a history of depression, anxiety, insomnia was evaluated by phone today.  Patient is currently stable on current medication regimen.  Patient to continue psychotherapy sessions.  Plan as noted below.  Plan Depression in remission Viibryd 20 mg p.o. daily Seroquel as prescribed  Anxiety disorder-stable Continue CBT with [name] Hydroxyzine 25 mg p.o. 3 times daily as needed for anxiety attacks  Insomnia-stable Seroquel 12.5 to 25 mg p.o. nightly Patient advised to keep track of his  vivid dreams.  It does not bother him at this time.  Patient has upcoming appointment with primary care provider for basic labs.  Patient encouraged to get it done.  Follow-up in clinic in 4 to 5 months or sooner if needed.  I have spent atleast 20 minutes non face to face with patient today. More than 50 % of the time was spent for preparing to see the patient ( e.g., review of test, records ), ordering medications and test ,psychoeducation and supportive psychotherapy and care coordination,as well as documenting clinical information in electronic health record. This note was generated in part or whole  with voice recognition software. Voice recognition is usually quite  accurate but there are transcription errors that can and very often do occur. I apologize for any typographical errors that were not detected and corrected.       Jomarie Longs, MD 10/22/2019, 10:13 AM

## 2019-10-29 ENCOUNTER — Ambulatory Visit (INDEPENDENT_AMBULATORY_CARE_PROVIDER_SITE_OTHER): Payer: Medicare Other | Admitting: Licensed Clinical Social Worker

## 2019-10-29 ENCOUNTER — Other Ambulatory Visit: Payer: Self-pay

## 2019-10-29 DIAGNOSIS — F640 Transsexualism: Secondary | ICD-10-CM | POA: Diagnosis not present

## 2019-10-29 DIAGNOSIS — F331 Major depressive disorder, recurrent, moderate: Secondary | ICD-10-CM

## 2019-10-29 DIAGNOSIS — F4010 Social phobia, unspecified: Secondary | ICD-10-CM

## 2019-10-29 NOTE — Progress Notes (Signed)
Virtual Visit via Telephone Note   I connected with Kyle Hood on 10/29/19 at 11:00am by telephone and verified that I am speaking with the correct person using two identifiers.   I discussed the limitations, risks, security and privacy concerns of performing an evaluation and management service by telephone and the availability of in person appointments. I also discussed with the patient that there may be a patient responsible charge related to this service. The patient expressed understanding and agreed to proceed.   I discussed the assessment and treatment plan with the patient. The patient was provided an opportunity to ask questions and all were answered. The patient agreed with the plan and demonstrated an understanding of the instructions.   The patient was advised to call back or seek an in-person evaluation if the symptoms worsen or if the condition fails to improve as anticipated.   I provided 45 minutes of non-face-to-face time during this encounter.     Kyle Stain, LCSW, LCAS _________________________ THERAPIST PROGRESS NOTE   Session Time: 11:00am - 11:45am  Location: Patient: Patient Home Provider: [facility]    Participation Level: Active   Behavioral Response: Alert, combined anxious/depressed mood   Type of Therapy:  Individual Therapy   Treatment Goals addressed: Medication compliance; Self-care routine; Anxiety management; Conflict resolution     Interventions: CBT, assertive communication skills, anger management, self-care routine    Summary: Kyle Hood is a 64 year old Caucasian transgender male to male that presented for telephone therapy session today with diagnoses of Social anxiety disorder; Major depressive disorder, recurrent, moderate; and Gender dysphoria.     Suicidal/Homicidal: None; without plan or intent.    Therapist Response: Clinician contacted Kyle Hood by phone today due to ongoing inability for her to access video-enabled applications at this  time and assessed for safety and sobriety.  Kyle Hood spoke in a manner that was alert, oriented x5, with no evidence of SI/HI or Kyle Hood.  Kyle Hood reported that she continues taking medication as prescribed, and denied use of alcohol or illicit substances.  Clinician inquired about Kyle Hood's current emotional ratings, as well as any significant changes in thoughts, feelings, or behavior since previous session.  Kyle Hood reported scores of 8/10 for depression and 7/10 for anxiety.  Kyle Hood reported that she last experienced a panic attack 1 week ago when riding in the car with partner, stating "He has road rage really bad and always puts me on edge".  Clinician discussed anger management strategies with Kyle Hood she could coach partner through while on car rides to assist in calming them down to avoid recurrence of panic attacks, including deep breathing, playing calming music, or pulling over for time outs if necessary.  Clinician inquired about any additional challenges that Kyle Hood has faced in past month, including issues related to social anxiety.  Kyle Hood reported that she continues to have issues with neighbors that "Flaunt their money" and have abrasive personalities.  Clinician discussed importance of managing boundaries with triggering people, as well as utilizing assertive communication skills when necessary to address ongoing concerns with people like this.  Kyle Hood reported that she finds most neighbors to be homophobic and will likely continue to ignore them and avoid confrontation unless absolutely necessary, stating "I hate dealing with people, and prefer to choose my battles carefully".  Clinician inquired about Kyle Hood's self-care routine at this time to balance out ongoing stressors in environment.  Kyle Hood reported that she has been working on a new painting which represents trauma that she experienced as a young  child.  Clinician inquired about whether revisiting past trauma has been therapeutic for Kyle Hood, or led to worsening  symptoms.  Kyle Hood reported that she had not thought of this activity as an outlet, stating "I thought art was kind of a useless super power".  Kyle Hood denied it worsening any related symptoms.  Intervention was effective, as evidenced by Kyle Hood reporting stating "I've never really let things like this out in my painting before.  I've thought about showing it to friends and can't say its an emotional release yet, but it could help me to get some of these things off my chest I've been holding onto".  Clinician was supportive of Kyle Hood's new direction with hobby based upon this feedback and encouraged her to set aside self-care time for this in routine each week.  Clinician will continue to monitor.      Plan: Follow up again in 1 month.    Diagnosis: Social Anxiety Disorder; Major Depressive Disorder, recurrent, moderate; and Gender Dysphoria   Kyle Stain, LCSW, LCAS 10/29/19

## 2019-11-29 ENCOUNTER — Ambulatory Visit (HOSPITAL_COMMUNITY): Payer: Medicare Other | Admitting: Licensed Clinical Social Worker

## 2019-11-30 ENCOUNTER — Ambulatory Visit (INDEPENDENT_AMBULATORY_CARE_PROVIDER_SITE_OTHER): Payer: Medicare Other | Admitting: Licensed Clinical Social Worker

## 2019-11-30 ENCOUNTER — Other Ambulatory Visit: Payer: Self-pay

## 2019-11-30 DIAGNOSIS — F4010 Social phobia, unspecified: Secondary | ICD-10-CM

## 2019-11-30 DIAGNOSIS — F640 Transsexualism: Secondary | ICD-10-CM

## 2019-11-30 DIAGNOSIS — F331 Major depressive disorder, recurrent, moderate: Secondary | ICD-10-CM | POA: Diagnosis not present

## 2019-11-30 NOTE — Progress Notes (Signed)
Virtual Visit via Telephone Note  I connected withAlan Hamiltonon8/3/21at11:00ambytelephoneand verified that I am speaking with the correct person using two identifiers.  I discussed the limitations, risks, security and privacy concerns of performing an evaluation and management service by telephone and the availability of in person appointments. I also discussed with the patient that there may be a patient responsible charge related to this service. The patient expressed understanding and agreed to proceed.  I discussed the assessment and treatment plan with the patient. The patient was provided an opportunity to ask questions and all were answered. The patient agreed with the plan and demonstrated an understanding of the instructions.  The patient was advised to call back or seek an in-person evaluation if the symptoms worsen or if the condition fails to improve as anticipated.  I provided1 hour of non-face-to-face time during this encounter.   Noralee Stain, LCSW, LCAS _________________________ THERAPIST PROGRESS NOTE  Session Time:11:00am - 12:00pm  Location: Patient: Patient Home Provider: [facility]   Participation Level:Active  Behavioral Response:Alert, combined anxious/depressed mood  Type of Therapy: Individual Therapy  Treatment Goals addressed:Medication compliance; Self-care routine; Anxiety management   Interventions:CBT, 5-4-3-2-1 grounding, treatment planning   Summary: Banner is a30 year old Caucasian transgender male to male that presented for telephone therapy session today with diagnoses of Social anxiety disorder; Major depressive disorder, recurrent, moderate; and Gender dysphoria.   Suicidal/Homicidal: None; without plan or intent.   Therapist Response: Clinician contacted [name] by phone today due to ongoing inability for her to access video-enabled applications at this time and assessed for safety and sobriety. Lovett spoke  in a manner that was alert, oriented x5, with no evidence or self-report of SI/HI or A/V H. Kriss reported ongoing compliance with medication and denied use of alcohol or illicit substances. Clinician inquired about Shamel's emotional ratings today, as well as any significant changes in thoughts, feelings, or behavior since last check-in. Maxximus reported scores of 8/10 for both depression and anxiety and stated "I did have a panic attack last Thursday when we were going to [place]".  Clinician inquired about present stressors involved in this event, as well as how she attempted to cope.  Shreyas reported that she and her partner have been going out more often, but due to her partner's increased stress, and her dislike for crowded places, this influenced the state of panic.  Parry reported that she took her anxiety medicine when this feeling was too much too bear any longer, which was effective in calming down.  Clinician discussed 5-4-3-2-1 grounding technique with Riaz today which could be utilized to aid in temporarily distracting her from troubling thoughts or feelings.  Clinician walked Davaughn through process of identifying 5 things she could see, 4 she could touch, 3 she could hear, 2 she could smell, and 1 thing she could taste in nearby environment.  Sandro participated in activity and reported that it did seem like it would be helpful for distraction, and noted doing something similar as a child when dealing with trauma.  Clinician also noted that 90 days have passed since last treatment plan update, and revisited this with [name] today too, making updates/revisions as follows:  Meet with clinician virtually once per month for therapy to address progress and needs; Meet with psychiatrist once every 2 months to address efficacy of medication and make adjustments as needed to regimen and/or dosage; Take medications daily as prescribed to reduce symptoms and improve overall daily functioning; Reduce depression from  average severity of 8/10  down to 6/10 in the next 90 days by engaging in 2-3 hours per day of positive self care activities such as painting, arranging dolls collection, flower arrangements, reading, and/or watching movies; Reduce anxiety from average severity of 8/10 down to 6/10 and reduce average panic attacks from 5 per month down to a 3 in next 90 days by exploring and utilizing relaxation techniques such as deep breathing, meditation and/or progressive muscle relaxation, as well as grounding techniques 2-3 times per day; Exercise once per day by engaging in walking around neighborhood for at least 15 minutes each time, in addition to following healthier diet each week in order to improve both physical and mental wellbeing; Work closely with therapist to improve communication skills in order to resolve conflict in social situations, particularly with triggering interactions with neighbors.  Clinician will continue to monitor.   Plan:Follow up again in 1 month.   Diagnosis:Social Anxiety Disorder;Major Depressive Disorder, recurrent, moderate; and Gender Dysphoria  Noralee Stain, LCSW, LCAS 11/30/19

## 2019-12-29 ENCOUNTER — Ambulatory Visit (HOSPITAL_COMMUNITY): Payer: Medicare Other | Admitting: Licensed Clinical Social Worker

## 2020-01-05 ENCOUNTER — Ambulatory Visit (HOSPITAL_COMMUNITY): Payer: Medicare Other | Admitting: Licensed Clinical Social Worker

## 2020-01-10 ENCOUNTER — Ambulatory Visit (INDEPENDENT_AMBULATORY_CARE_PROVIDER_SITE_OTHER): Payer: Medicare Other | Admitting: Licensed Clinical Social Worker

## 2020-01-10 ENCOUNTER — Other Ambulatory Visit: Payer: Self-pay

## 2020-01-10 DIAGNOSIS — F331 Major depressive disorder, recurrent, moderate: Secondary | ICD-10-CM | POA: Diagnosis not present

## 2020-01-10 DIAGNOSIS — F640 Transsexualism: Secondary | ICD-10-CM | POA: Diagnosis not present

## 2020-01-10 DIAGNOSIS — F4010 Social phobia, unspecified: Secondary | ICD-10-CM | POA: Diagnosis not present

## 2020-01-10 NOTE — Progress Notes (Signed)
Virtual Visit via Telephone Note   I connected with Kyle Hood on 01/10/20 at 2:00pm by telephone and verified that I am speaking with the correct person using two identifiers.   I discussed the limitations, risks, security and privacy concerns of performing an evaluation and management service by telephone and the availability of in person appointments. I also discussed with the patient that there may be a patient responsible charge related to this service. The patient expressed understanding and agreed to proceed.   I discussed the assessment and treatment plan with the patient. The patient was provided an opportunity to ask questions and all were answered. The patient agreed with the plan and demonstrated an understanding of the instructions.   The patient was advised to call back or seek an in-person evaluation if the symptoms worsen or if the condition fails to improve as anticipated.   I provided 30 minutes of non-face-to-face time during this encounter.     Kyle Stain, LCSW, LCAS _________________________ THERAPIST PROGRESS NOTE   Session Time: 2:00pm - 2:30pm  Location: Patient: Patient Home Provider: [facility]    Participation Level: Active   Behavioral Response: Alert, combined anxious/depressed mood   Type of Therapy:  Individual Therapy   Treatment Goals addressed: Medication compliance; Self-care routine; Anxiety management; Resolving communication issues    Interventions: CBT, problem solving    Summary: Kingsly is a 64 year old Caucasian transgender male to male that presented for telephone therapy session today with diagnoses of Social anxiety disorder; Major depressive disorder, recurrent, moderate; and Gender dysphoria.     Suicidal/Homicidal: None; without plan or intent.    Therapist Response: Clinician contacted [name] by phone today due to ongoing inability for her to access video-enabled applications at this time and assessed for safety and sobriety.   Jovonte spoke in a manner that was alert, oriented x5, with no evidence or self-report of SI/HI or A/V H.  Nichols reported that she continues to take medication as prescribed and denied use of alcohol or illicit substances.  Clinician inquired about Myers's current emotional ratings, as well as any significant changes in thoughts, feelings, or behavior since previous check-in.  Hobart reported scores of 8/10 for both depression and anxiety, which is consistent with last session, and reported experiencing panic like symptoms lately due to stress from dealing with neighbors.  Clinician inquired about what kind of issues Renji has been dealing with in regard to neighbors, and whether any approaches have proved effective in curbing interpersonal problems/stress.  Tran reported that she has neighbors that walk their dogs in common areas and do not clean up after themselves, and it appears like it can be intentional at times based upon patterns Pace has noticed.  Clinician discussed strategies for dealing with problematic neighbor behavior influencing current mood, including addressing directly with them when it is observed, outreaching apartment management for support based upon related policies, or finding alternative ways to adapt without need for confrontation.  Kolbee stated "I'm going to wait it out for now and see what happens, because I don't really feel like I'm in control of this".  Timmey reported that despite the impact it has on her mental health, she feels like her gender identity could make her a target in the neighborhood if she speaks up, so she will instead try to distract herself with self-care such as painting, which she has invested more in, including buying more canvases, and thrifting painting books for inspiration. Keagon reported that she wished to end session  early today due to cold symptoms making it difficult to continue engagement in discussion, including a scratchy/sore throat and achiness.  Clinician  expressed understanding and encouraged Gianno to get plenty of rest and hydration, but consider getting a COVID test due to related symptoms, and outreach PCP for consult if condition worsens.  Aerion was agreeable to this suggestion and reported that her partner is monitoring her closely.  Clinician will continue to monitor.      Plan: Follow up again in 1 month.    Diagnosis: Social Anxiety Disorder; Major Depressive Disorder, recurrent, moderate; and Gender Dysphoria   Kyle Hood, [address], LCAS 01/10/20

## 2020-02-08 ENCOUNTER — Ambulatory Visit (INDEPENDENT_AMBULATORY_CARE_PROVIDER_SITE_OTHER): Payer: Medicare Other | Admitting: Licensed Clinical Social Worker

## 2020-02-08 ENCOUNTER — Other Ambulatory Visit: Payer: Self-pay

## 2020-02-08 DIAGNOSIS — F4010 Social phobia, unspecified: Secondary | ICD-10-CM

## 2020-02-08 DIAGNOSIS — F331 Major depressive disorder, recurrent, moderate: Secondary | ICD-10-CM | POA: Diagnosis not present

## 2020-02-08 DIAGNOSIS — F640 Transsexualism: Secondary | ICD-10-CM | POA: Diagnosis not present

## 2020-02-08 NOTE — Progress Notes (Signed)
Virtual Visit via Telephone Note   I connected with Kyle Hood on 02/08/20 at 11:00am by telephone and verified that I am speaking with the correct person using two identifiers.   I discussed the limitations, risks, security and privacy concerns of performing an evaluation and management service by telephone and the availability of in person appointments. I also discussed with the patient that there may be a patient responsible charge related to this service. The patient expressed understanding and agreed to proceed.   I discussed the assessment and treatment plan with the patient. The patient was provided an opportunity to ask questions and all were answered. The patient agreed with the plan and demonstrated an understanding of the instructions.   The patient was advised to call back or seek an in-person evaluation if the symptoms worsen or if the condition fails to improve as anticipated.   I provided 30 minutes of non-face-to-face time during this encounter.     Kyle Stain, Kyle Hood, Kyle Hood _________________________ THERAPIST PROGRESS NOTE   Session Time: 11:00am - 11:30am  Location: Patient: Patient Home Provider: [facility]    Participation Level: Active   Behavioral Response: Alert, combined anxious/depressed mood   Type of Therapy:  Individual Therapy   Treatment Goals addressed: Medication compliance; Self-care routine; Anxiety management    Interventions: CBT, healthy boundaries    Summary: Kyle Hood is a 64 year old Caucasian transgender male to male that presented for telephone therapy session today with diagnoses of Social anxiety disorder; Major depressive disorder, recurrent, moderate; and Gender dysphoria.     Suicidal/Homicidal: None; without plan or intent.    Therapist Response: Clinician contacted Kyle Hood by phone today due to ongoing inability for her to access video-enabled application.  Clinician assessed for safety and sobriety.  Kyle Hood answered this phone call on  time and spoke in a manner that was alert, oriented x5, with no evidence or self-report of SI/HI or A/V H.  Kyle Hood reported ongoing compliance with her medications and denied use of alcohol or illicit substances.  Clinician inquired about Kyle Hood emotional ratings today, as well as any significant changes in thoughts, feelings, or behavior since last check-in.  Kyle Hood reported scores of 8/10 for both depression and anxiety, which is consistent with previous session and noted that she has had panic attacks "Now and then", most recently last week when dealing with a disrespectful neighbor.  Kyle Hood also reported that she feels like the world is falling apart due to the news that she hears from her partner and what she sees on social media throughout the week.  Clinician discussed importance of establishing healthy boundaries with engagement in social media and news due to the impact that this can have upon one's mental health, and offered suggestions for improvement, including giving oneself time to unplug when necessary, setting time limits, cutting ties with negative people or news feeds, and trying to find positive platforms to interact with peers on.  Clinician also encouraged Kyle Hood to assert herself when her partner is sharing news that distresses her to properly express needs and place limits on what is shared between them.  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  She reported that she would also try to refocus partner's perspective towards more positive things, such as retirement and planning enjoyable activities to engage in together.  Kyle Hood  wished to half session time today, and agreed to follow up in 1 month.  Clinician will continue to monitor.      Plan: Follow up again in 1 month.    Diagnosis: Social Anxiety  Disorder; Major Depressive Disorder, recurrent, moderate; and Gender Dysphoria   Kyle Hood, [address], Kyle Hood 02/08/20

## 2020-03-14 ENCOUNTER — Other Ambulatory Visit: Payer: Self-pay

## 2020-03-14 ENCOUNTER — Ambulatory Visit (INDEPENDENT_AMBULATORY_CARE_PROVIDER_SITE_OTHER): Payer: Medicare Other | Admitting: Licensed Clinical Social Worker

## 2020-03-14 DIAGNOSIS — F640 Transsexualism: Secondary | ICD-10-CM | POA: Diagnosis not present

## 2020-03-14 DIAGNOSIS — F4010 Social phobia, unspecified: Secondary | ICD-10-CM

## 2020-03-14 DIAGNOSIS — F331 Major depressive disorder, recurrent, moderate: Secondary | ICD-10-CM | POA: Diagnosis not present

## 2020-03-14 NOTE — Progress Notes (Signed)
Virtual Visit via Telephone Note   I connected with Kyle Hood on 03/14/20 at 11:00am by telephone and verified that I am speaking with the correct person using two identifiers.   I discussed the limitations, risks, security and privacy concerns of performing an evaluation and management service by telephone and the availability of in person appointments. I also discussed with the patient that there may be a patient responsible charge related to this service. The patient expressed understanding and agreed to proceed.   I discussed the assessment and treatment plan with the patient. The patient was provided an opportunity to ask questions and all were answered. The patient agreed with the plan and demonstrated an understanding of the instructions.   The patient was advised to call back or seek an in-person evaluation if the symptoms worsen or if the condition fails to improve as anticipated.   I provided 30 minutes of non-face-to-face time during this encounter.     Shade Flood, LCSW, LCAS _________________________ THERAPIST PROGRESS NOTE   Session Time: 11:00am - 11:30am  Location: Patient: Patient Home Provider: [facility] [place] [facility]    Participation Level: Active   Behavioral Response: Alert, combined anxious/depressed mood   Type of Therapy:  Individual Therapy   Treatment Goals addressed: Medication compliance; Anxiety management    Interventions: CBT, treatment planning    Summary: Kyle Hood is a 64 year old Caucasian transgender male to male that presented for telephone therapy session today with diagnoses of Social anxiety disorder; Major depressive disorder, recurrent, moderate; and Gender dysphoria.     Suicidal/Homicidal: None; without plan or intent.    Therapist Response: Clinician contacted Kyle Hood by phone today due to ongoing inability for her to access video-enabled application.  Clinician assessed for safety and sobriety.  Jacquelyn answered this phone call on time and spoke in a  manner that was alert, oriented x5, with no evidence or self-report of SI/HI or A/V H.  Caid reported that she continues taking medications as prescribed and denied use of alcohol or illicit substances.  Clinician inquired about Kyle Hood's current emotional ratings, as well as any significant changes in thoughts, feelings, or behavior since previous check-in.  Kyle Hood reported scores of 5/10 for depression, 8/10 for anxiety, 2/10 for irritability, and noted that she is experiencing panic attacks x1 per week on average when going out to stores or shopping.  Clinician inquired about present triggers influencing these attacks, and how [name] has been attempting to cope.  Trapper reported that she tends to anticipate conflict with other people, and this worry was reinforced last week when she had to ask a woman blocking an aisle to move when out shopping, and was met with hostility.  Kyle Hood stated "I was proud of myself for speaking up, its not something I do often, but little things like that can get me worked up and I have to sit and calm down or I start to hyperventilate".  Clinician discussed various symptoms associated with impending panic attacks (I.e. fear of loss of control/death, increased heart rate, sweating, shaking, shortness of breath, etc) as well as strategies for coping, such as using deep breathing, taking a walk, listening to relaxing music, or engaging in positive self-talk (I.e. "Everything will be okay, this feeling will pass").  Clinician encouraged [name] to be mindful of symptoms when they arise and practice skills in order to decrease frequency of occurrences.  Kyle Hood was receptive to suggestions, and also noted that her partner is an asset in similar crisis, stating "He coaches me to  calm down and sometimes just knowing he is there helps a lot".  Kyle Hood reported that her arthritic pain was at an 8/10 today and she preferred to engage in a half session, so clinician proposed revisiting treatment plan with  remaining time to identify if any changes need to be made at this time.  Kyle Hood was agreeable, and collaborated to make revisions as follows:  Meet with clinician virtually once per month for therapy to address progress and needs; Meet with psychiatrist once every 2 months to address efficacy of medication and make adjustments as needed to regimen and/or dosage; Take medications daily as prescribed to reduce symptoms and improve overall daily functioning; Reduce depression from average severity of 6/10 down to 4/10 in the next 90 days by engaging in 2-3 hours per day of positive self care activities such as painting, arranging dolls collection, flower arrangements, reading, and/or watching movies; Reduce anxiety from average severity of 8/10 down to 6/10 and reduce average panic attacks from 4 per month down to a 3 in next 90 days by exploring and utilizing relaxation techniques such as deep breathing, meditation and/or progressive muscle relaxation, as well as grounding techniques 2-3 times per day; Exercise once per day by walking around neighborhood for at least 15 minutes each time, in addition to following healthier diet each week in order to improve both physical and mental wellbeing; Work closely with therapist to improve communication skills in order to resolve conflict in social situations, particularly with triggering interactions involving neighbors or fellow shoppers in stores. Clinician will continue to monitor.      Plan: Follow up again in 1 month.    Diagnosis: Social Anxiety Disorder; Major Depressive Disorder, recurrent, moderate; and Gender Dysphoria   Shade Flood, LCSW, LCAS 03/14/20

## 2020-03-17 ENCOUNTER — Telehealth (INDEPENDENT_AMBULATORY_CARE_PROVIDER_SITE_OTHER): Payer: Medicare Other | Admitting: Psychiatry

## 2020-03-17 ENCOUNTER — Other Ambulatory Visit: Payer: Self-pay

## 2020-03-17 ENCOUNTER — Encounter: Payer: Self-pay | Admitting: Psychiatry

## 2020-03-17 DIAGNOSIS — F4010 Social phobia, unspecified: Secondary | ICD-10-CM | POA: Diagnosis not present

## 2020-03-17 DIAGNOSIS — F410 Panic disorder [episodic paroxysmal anxiety] without agoraphobia: Secondary | ICD-10-CM

## 2020-03-17 DIAGNOSIS — F3342 Major depressive disorder, recurrent, in full remission: Secondary | ICD-10-CM

## 2020-03-17 MED ORDER — QUETIAPINE FUMARATE 25 MG PO TABS: ORAL_TABLET | ORAL | 1 refills | Status: DC

## 2020-03-17 MED ORDER — VIIBRYD 20 MG PO TABS: ORAL_TABLET | ORAL | 1 refills | Status: DC

## 2020-03-17 MED ORDER — HYDROXYZINE HCL 25 MG PO TABS: 12.5000 mg | ORAL_TABLET | Freq: Three times a day (TID) | ORAL | 1 refills | Status: DC | PRN

## 2020-03-17 NOTE — Progress Notes (Signed)
Virtual Visit via Telephone Note  I connected with Kyle Hood on 03/17/20 at 10:00 AM EST by telephone and verified that I am speaking with the correct person using two identifiers.  Location Provider Location : [facility] Patient Location : Home  Participants: Patient , Provider   I discussed the limitations, risks, security and privacy concerns of performing an evaluation and management service by telephone and the availability of in person appointments. I also discussed with the patient that there may be a patient responsible charge related to this service. The patient expressed understanding and agreed to proceed.  I discussed the assessment and treatment plan with the patient. The patient was provided an opportunity to ask questions and all were answered. The patient agreed with the plan and demonstrated an understanding of the instructions.  The patient was advised to call back or seek an in-person evaluation if the symptoms worsen or if the condition fails to improve as anticipated.   BH MD OP Progress Note  03/17/2020 10:23 AM Kyle Hood  MRN:  [id number]  Chief Complaint:  Chief Complaint    Follow-up     HPI: Kyle Hood is a 64 year old transgender male to male, lives in [place], has a history of MDD, panic attacks, social anxiety disorder was evaluated by phone today.  Patient today reports they are currently dealing with some worsening anxiety symptoms in public situations.  They had several episodes of anxiety at public spaces like grocery store.  Recently felt like another shopper was staring at them and had to speak out.  This happened after they asked the shopper to move away and let them pass and the shopper did not do it.  Reports however they were able to cope well and the anxiety was not too overwhelming since they had taken hydroxyzine right before going for shopping.  The hydroxyzine does help to calm the anxiety down.  Patient also reports being compliant  on all other medications including the viibryd.  Not interested in increasing any of those medication dosages today and would like to rather follow-up with therapist.  Patient denies any suicidality, homicidality or perceptual disturbances.  Patient denies any other concerns today.  Visit Diagnosis:    ICD-10-CM   1. MDD (major depressive disorder), recurrent, in full remission (HCC)  F33.42 QUEtiapine (SEROQUEL) 25 MG tablet  2. Social anxiety disorder  F40.10 Vilazodone HCl (VIIBRYD) 20 MG TABS  3. Panic disorder  F41.0 QUEtiapine (SEROQUEL) 25 MG tablet    hydrOXYzine (ATARAX/VISTARIL) 25 MG tablet    Past Psychiatric History: I have reviewed past psychiatric history from my progress note on 06/05/2017.  Past trials of Zoloft, Wellbutrin.  Past Medical History:  Past Medical History:  Diagnosis Date  . Abnormal liver function test   . Anxiety   . Anxiety   . Arthritis   . Chronic lower back pain   . Degenerative joint disease   . Depression   . Fatty liver   . Heart murmur   . Hepatitis B   . Hyperlipidemia   . Hypertension   . Lumbar polyradiculopathy   . Panic attacks   . Polyneuropathy   . Vertigo 05/03/2017   ER    Past Surgical History:  Procedure Laterality Date  . COLONOSCOPY    . COLONOSCOPY WITH PROPOFOL N/A 10/01/2017   Procedure: COLONOSCOPY WITH PROPOFOL;  Surgeon: Toledo, [name], MD;  Location: [facility] ENDOSCOPY;  Service: Endoscopy;  Laterality: N/A;  . PROSTATE BIOPSY      Family  Psychiatric History: I have reviewed family psychiatric history from my progress note on 06/05/2017  Family History:  Family History  Problem Relation Age of Onset  . Cancer Mother        Pancreatic  . Diabetes Mother   . Hypertension Mother   . Varicose Veins Mother   . Alcohol abuse Mother   . Heart disease Father        CABG  . Cancer Father        Jaw  . Aortic aneurysm Father   . Congestive Heart Failure Father   . Hypertension Father   . Varicose Veins Father    . Alcohol abuse Father   . Bipolar disorder Sister   . Bipolar disorder Brother     Social History: I have reviewed social history from my progress note on 06/05/2017 Social History   Socioeconomic History  . Marital status: Significant Other    Spouse name: denise  . Number of children: 0  . Years of education: Not on file  . Highest education level: 11th grade  Occupational History    Comment: disabled  Tobacco Use  . Smoking status: Never Smoker  . Smokeless tobacco: Never Used  Vaping Use  . Vaping Use: Never used  Substance and Sexual Activity  . Alcohol use: No  . Drug use: No  . Sexual activity: Not Currently  Other Topics Concern  . Not on file  Social History Narrative  . Not on file   Social Determinants of Health   Financial Resource Strain:   . Difficulty of Paying Living Expenses: Not on file  Food Insecurity:   . Worried About Programme researcher, broadcasting/film/video in the Last Year: Not on file  . Ran Out of Food in the Last Year: Not on file  Transportation Needs:   . Lack of Transportation (Medical): Not on file  . Lack of Transportation (Non-Medical): Not on file  Physical Activity:   . Days of Exercise per Week: Not on file  . Minutes of Exercise per Session: Not on file  Stress:   . Feeling of Stress : Not on file  Social Connections:   . Frequency of Communication with Friends and Family: Not on file  . Frequency of Social Gatherings with Friends and Family: Not on file  . Attends Religious Services: Not on file  . Active Member of Clubs or Organizations: Not on file  . Attends Banker Meetings: Not on file  . Marital Status: Not on file    Allergies:  Allergies  Allergen Reactions  . Lisinopril Cough    Metabolic Disorder Labs: Lab Results  Component Value Date   HGBA1C 6.1 08/16/2014   No results found for: PROLACTIN Lab Results  Component Value Date   CHOL 145 08/16/2014   TRIG 320 (A) 08/16/2014   HDL 36 08/16/2014   LDLCALC 45  08/16/2014   Lab Results  Component Value Date   TSH 1.61 08/16/2014    Therapeutic Level Labs: No results found for: LITHIUM No results found for: VALPROATE No components found for:  CBMZ  Current Medications: Current Outpatient Medications  Medication Sig Dispense Refill  . diclofenac sodium (VOLTAREN) 1 % GEL Apply topically as needed.     . hydrochlorothiazide (HYDRODIURIL) 25 MG tablet Take 25 mg by mouth daily.    . hydrOXYzine (ATARAX/VISTARIL) 25 MG tablet Take 0.5-1 tablets (12.5-25 mg total) by mouth 3 (three) times daily as needed. For severe anxiety 90 tablet 1  .  naproxen (NAPROSYN) 500 MG tablet TK 1 T PO BID WC  3  . pravastatin (PRAVACHOL) 10 MG tablet Take 10 mg by mouth daily.    . QUEtiapine (SEROQUEL) 25 MG tablet TAKE 1 TABLET(25 MG) BY MOUTH AT BEDTIME FOR MOOD OR SLEEP 90 tablet 1  . valsartan (DIOVAN) 40 MG tablet Take by mouth.    . valsartan (DIOVAN) 40 MG tablet Take 40 mg by mouth daily.    . Vilazodone HCl (VIIBRYD) 20 MG TABS TAKE 1 TABLET(20 MG) BY MOUTH DAILY 90 tablet 1   No current facility-administered medications for this visit.     Musculoskeletal: Strength & Muscle Tone: UTA Gait & Station: UTA Patient leans: N/A  Psychiatric Specialty Exam: Review of Systems  Musculoskeletal: Positive for arthralgias.  Psychiatric/Behavioral: The patient is nervous/anxious.   All other systems reviewed and are negative.   There were no vitals taken for this visit.There is no height or weight on file to calculate BMI.  General Appearance: UTA  Eye Contact:  UTA  Speech:  Clear and Coherent  Volume:  Normal  Mood:  Anxious  Affect:  UTA  Thought Process:  Goal Directed and Descriptions of Associations: Intact  Orientation:  Full (Time, Place, and Person)  Thought Content: Logical   Suicidal Thoughts:  No  Homicidal Thoughts:  No  Memory:  Immediate;   Fair Recent;   Fair Remote;   Fair  Judgement:  Fair  Insight:  Fair  Psychomotor  Activity:  UTA  Concentration:  Concentration: Fair and Attention Span: Fair  Recall:  [employer] of Knowledge: Fair  Language: Fair  Akathisia:  No  Handed:  Right  AIMS (if indicated): UTA  Assets:  Communication Skills Desire for Improvement Housing  ADL's:  Intact  Cognition: WNL  Sleep:  Fair   Screenings: PHQ2-9     Office Visit from 02/03/2019 in [facility] Office Visit from 04/02/2018 in [facility] Procedure visit from 02/25/2018 in [facility] Office Visit from 01/08/2018 in [facility] Office Visit from 07/03/2017 in [facility]  PHQ-2 Total Score 3 0 0 0 0  PHQ-9 Total Score 7 -- -- -- --       Assessment and Plan: Johathan Province is a 64 year old male who has a history of depression, anxiety, insomnia was evaluated by phone today.  Patient does report some anxiety however is coping well.  Patient is currently stable on current medication regimen.  Plan as noted below.  Plan Depression in remission Viibryd 20 mg p.o. daily Seroquel as prescribed  Anxiety disorder-stable Continue CBT with [name] Hydroxyzine 25 mg p.o. 3 times daily as needed for severe anxiety attacks  Insomnia-stable Seroquel 12.5 to 25 mg p.o. nightly   Follow-up in clinic in 4 to 5 months or sooner if needed.  I have spent atleast 20 minutes non face to face with patient today. More than 50 % of the time was spent for preparing to see the patient ( e.g., review of test, records ), ordering medications and test ,psychoeducation and supportive psychotherapy and care coordination,as well as documenting clinical information in electronic health record. This note was generated in part or whole with voice recognition software. Voice recognition is usually quite accurate but there are  transcription errors that can and very often do occur. I apologize for any typographical errors that were  not detected and corrected.     Jomarie Longs, MD 03/17/2020, 10:23 AM

## 2020-04-05 ENCOUNTER — Other Ambulatory Visit: Payer: Self-pay

## 2020-04-05 ENCOUNTER — Ambulatory Visit (INDEPENDENT_AMBULATORY_CARE_PROVIDER_SITE_OTHER): Payer: Medicare Other | Admitting: Licensed Clinical Social Worker

## 2020-04-05 DIAGNOSIS — F331 Major depressive disorder, recurrent, moderate: Secondary | ICD-10-CM

## 2020-04-05 DIAGNOSIS — F4010 Social phobia, unspecified: Secondary | ICD-10-CM

## 2020-04-05 DIAGNOSIS — F640 Transsexualism: Secondary | ICD-10-CM | POA: Diagnosis not present

## 2020-04-05 NOTE — Progress Notes (Signed)
Virtual Visit via Telephone Note   I connected with Kyle Hood on 04/05/20 at 11:00am by telephone and verified that I am speaking with the correct person using two identifiers.   I discussed the limitations, risks, security and privacy concerns of performing an evaluation and management service by telephone and the availability of in person appointments. I also discussed with the patient that there may be a patient responsible charge related to this service. The patient expressed understanding and agreed to proceed.   I discussed the assessment and treatment plan with the patient. The patient was provided an opportunity to ask questions and all were answered. The patient agreed with the plan and demonstrated an understanding of the instructions.   The patient was advised to call back or seek an in-person evaluation if the symptoms worsen or if the condition fails to improve as anticipated.   I provided 45 minutes of non-face-to-face time during this encounter.     Noralee Stain, LCSW, LCAS _________________________ THERAPIST PROGRESS NOTE   Session Time: 11:00am - 11:45am  Location: Patient: Patient Home Provider: [facility]    Participation Level: Active   Behavioral Response: Alert, combined anxious/depressed mood   Type of Therapy:  Individual Therapy   Treatment Goals addressed: Medication compliance; Anxiety and depression management; Improving communication skills     Interventions: CBT, problem solving   Summary: Kyle Hood is a 64 year old Caucasian transgender male to male that presented for telephone therapy session today with diagnoses of Social anxiety disorder; Major depressive disorder, recurrent, moderate; and Gender dysphoria.     Suicidal/Homicidal: None; without plan or intent.    Therapist Response: Clinician contacted Kyle Hood by phone today due to ongoing inability for her to access video-enabled meetings.  Clinician assessed for safety, medication compliance, and  sobriety.  Kyle Hood answered this phone call on time and spoke in a manner that was alert, oriented x5, with no evidence or self-report of SI/HI or A/V H.  Kyle Hood reported ongoing compliance with medications and denied use of alcohol or illicit substances.  Clinician inquired about Kyle Hood emotional ratings today, as well as any significant changes in thoughts, feelings, or behavior since last check-in.  Kyle Hood reported scores of 8/10 for depression, 5-6/10 for anxiety, 0/10 for irritability, and denied any reoccurrence of panic attacks, stating "I've been trying to stay away from triggers".  Clinician inquired about Kyle Hood's successes and struggles since last check-in.  Kyle Hood reported that she has been able to complete a lot of online shopping ahead of Christmas to avoid exposure to triggering people.  Kyle Hood reported that she continues to experience anxiety related to her neighbors, who she doesn't get along with, and feels they treat her unfairly.  Clinician discussed strategies for addressing difficult people in order to manage stress, including use of relaxation techniques such as deep breathing to stay calm, increasing empathy to understand other perspectives, talking with close supports about interpersonal issues for advice/feedback, expressing one's own feelings honestly to increase mutual understanding, and exploring mutual solutions to resolve disagreements.  Clinician and Kyle Hood also discussed pros and cons of relocating to another place to live with more space from triggering people, places, and things.  Interventions were effective, as evidenced by Kyle Hood reporting that she finds discussing issues with clinician to provide some stress relief, and offers exploration of solutions she wouldn't normally think of.  Kyle Hood reported that she would also try to distract herself when resolution cannot be found by focusing on things she can change that bring her  joy, such as continuing to dedicate self-care time to activities like  painting, watching old horror/suspense shows and talking to supports online with similar interests that she can relate to.  Clinician will continue to monitor.      Plan: Follow up again in 1 month.    Diagnosis: Social Anxiety Disorder; Major Depressive Disorder, recurrent, moderate; and Gender Dysphoria   Noralee Stain, [address], LCAS 04/05/20

## 2020-05-03 ENCOUNTER — Other Ambulatory Visit: Payer: Self-pay

## 2020-05-03 ENCOUNTER — Ambulatory Visit (INDEPENDENT_AMBULATORY_CARE_PROVIDER_SITE_OTHER): Payer: Medicare HMO | Admitting: Licensed Clinical Social Worker

## 2020-05-03 DIAGNOSIS — F4010 Social phobia, unspecified: Secondary | ICD-10-CM

## 2020-05-03 DIAGNOSIS — F331 Major depressive disorder, recurrent, moderate: Secondary | ICD-10-CM | POA: Diagnosis not present

## 2020-05-03 DIAGNOSIS — F640 Transsexualism: Secondary | ICD-10-CM

## 2020-05-03 NOTE — Progress Notes (Signed)
Virtual Visit via Telephone Note   I connected with Kyle Hood on 05/03/20 at 11:00am by telephone and verified that I am speaking with the correct person using two identifiers.   I discussed the limitations, risks, security and privacy concerns of performing an evaluation and management service by telephone and the availability of in person appointments. I also discussed with the patient that there may be a patient responsible charge related to this service. The patient expressed understanding and agreed to proceed.   I discussed the assessment and treatment plan with the patient. The patient was provided an opportunity to ask questions and all were answered. The patient agreed with the plan and demonstrated an understanding of the instructions.   The patient was advised to call back or seek an in-person evaluation if the symptoms worsen or if the condition fails to improve as anticipated.   I provided 30 minutes of non-face-to-face time during this encounter.     Kyle Stain, LCSW, LCAS _________________________ THERAPIST PROGRESS NOTE   Session Time: 11:00am - 11:30am  Location: Patient: Patient Home Provider: [facility]    Participation Level: Active   Behavioral Response: Alert, depressed mood   Type of Therapy:  Individual Therapy   Treatment Goals addressed: Medication compliance; Anxiety and depression management    Interventions: CBT, problem solving   Summary: Kyle Hood is a 65 year old Caucasian transgender male to male that presented for telephone therapy session today with diagnoses of Social anxiety disorder; Major depressive disorder, recurrent, moderate; and Gender dysphoria.     Suicidal/Homicidal: None; without plan or intent.    Therapist Response: Clinician contacted Kyle Hood by phone today due to ongoing inability for her to access video-enabled sessions or travel to meet in person.  Clinician assessed for safety, medication compliance, and sobriety.  Kyle Hood  answered this phone call on time and spoke in a manner that was alert, oriented x5, with no evidence or self-report of SI/HI or A/V H.  Kyle Hood reported that she continues taking medication as prescribed and denied use of alcohol or illicit substances.  Clinician inquired about [name]'s current emotional ratings, as well as any significant changes in thoughts, feelings, or behavior since previous check-in.  Kyle Hood reported scores of 7/10 for depression, 5/10 for anxiety, 0/10 for irritability, and noted that she has been experiencing panic attacks roughly x1 per week.  Clinician inquired about Kyle Hood's present stressors contributing to anxiety and panic episodes.  Huck reported that she recently discovered that the floor has been sinking in the apartment, so she and her partner have been concerned that it will soon break through.  Kyle Hood also reported that she has been more stressed recently when going online, as news tends to be distressing and interactions with people about politics, the pandemic and other topics usually turn negative, stating "There is a lot of hate out there and fear mongering".  Clinician explored strategies with Kyle Hood for addressing these stressors to alleviate anxiety and panic episodes, including seeking assistance from management and/or [facility] to address housing concerns, as well as establishing more rigid social media boundary via breaks from the news, social media, and other outlets in favor of healthy self-care activities that Kyle Hood prefers such as painting.  Interventions were effective, as evidenced by Kyle Hood reporting greater confidence in issue at home being resolved soon and agreeing that break from social media would be beneficial for time being, stating "I recognize that I do have some control over these situations".  Kyle Hood asked to cut  session short due to her throat growing sorer as conversation wore on and stating "I feel like I'm probably getting sick because of the  change in weather this week".  Clinician encouraged her to monitor condition closely and seek medication guidance if symptoms worsen significantly. Kyle Hood reported that she would, and plan to schedule follow up session soon. Clinician will continue to monitor.      Plan: Follow up again in 1 month.    Diagnosis: Social Anxiety Disorder; Major Depressive Disorder, recurrent, moderate; and Gender Dysphoria   Kyle Stain, LCSW, LCAS 05/03/20

## 2020-06-14 ENCOUNTER — Other Ambulatory Visit: Payer: Self-pay

## 2020-06-14 ENCOUNTER — Ambulatory Visit (INDEPENDENT_AMBULATORY_CARE_PROVIDER_SITE_OTHER): Payer: Medicare HMO | Admitting: Licensed Clinical Social Worker

## 2020-06-14 DIAGNOSIS — F331 Major depressive disorder, recurrent, moderate: Secondary | ICD-10-CM | POA: Diagnosis not present

## 2020-06-14 DIAGNOSIS — F640 Transsexualism: Secondary | ICD-10-CM

## 2020-06-14 DIAGNOSIS — F4010 Social phobia, unspecified: Secondary | ICD-10-CM
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
## 2020-07-12 ENCOUNTER — Other Ambulatory Visit: Payer: Self-pay

## 2020-07-12 ENCOUNTER — Ambulatory Visit (INDEPENDENT_AMBULATORY_CARE_PROVIDER_SITE_OTHER): Payer: Medicare HMO | Admitting: Licensed Clinical Social Worker

## 2020-07-12 DIAGNOSIS — F331 Major depressive disorder, recurrent, moderate: Secondary | ICD-10-CM

## 2020-07-12 DIAGNOSIS — F640 Transsexualism: Secondary | ICD-10-CM

## 2020-07-12 DIAGNOSIS — F4010 Social phobia, unspecified: Secondary | ICD-10-CM

## 2020-07-12 NOTE — Progress Notes (Signed)
Virtual Visit via Telephone Note   I connected with Kyle Hood on 07/12/20 at 1:00pm by telephone and verified that I am speaking with the correct person using two identifiers.   I discussed the limitations, risks, security and privacy concerns of performing an evaluation and management service by telephone and the availability of in person appointments. I also discussed with the patient that there may be a patient responsible charge related to this service. The patient expressed understanding and agreed to proceed.   I discussed the assessment and treatment plan with the patient. The patient was provided an opportunity to ask questions and all were answered. The patient agreed with the plan and demonstrated an understanding of the instructions.   The patient was advised to call back or seek an in-person evaluation if the symptoms worsen or if the condition fails to improve as anticipated.   I provided 30 minutes of non-face-to-face time during this encounter.     Noralee Stain, LCSW, LCAS _________________________ THERAPIST PROGRESS NOTE   Session Time: 1:00pm - 1:30pm  Location: Patient: Patient Home Provider: [facility] OPT Office   Participation Level: Active   Behavioral Response: Alert, depressed mood   Type of Therapy:  Individual Therapy   Treatment Goals addressed: Medication compliance; Anxiety and depression management    Interventions: CBT, grief counseling   Summary: Kyle Hood is a 65 year old Caucasian transgender male to male that presented for telephone therapy session today with diagnoses of Social anxiety disorder; Major depressive disorder, recurrent, moderate; and Gender dysphoria.     Suicidal/Homicidal: None; without plan or intent.    Therapist Response: Clinician contacted Kyle Hood by phone today due to ongoing inability for her to access video-enabled sessions or travel to meet in person.  Clinician assessed for safety, medication compliance, and sobriety.  [name] answered  this phone call on time and spoke in a manner that was alert, oriented x5, with no evidence or self-report of active SI/HI or A/V H.  [name] reported ongoing compliance with medication and denied use of alcohol or illicit substances.  Clinician inquired about Kyle Hood's emotional ratings today, as well as any significant changes in thoughts, feelings, or behavior since last check-in.  [name] reported scores of 9/10 for depression, 8/10 for anxiety, 0/10 for anger/irritability, and noted that panic attacks have decreased to once every 2 weeks.  [name] reported that she has been feeling more depressed lately, stating "Its been a really sad weekend".  She reported that her partner's step brother passed away unexpectedly and [name] has found it difficult to witness the partner's grief and console them properly.  Clinician covered a handout with Kyle Hood regarding how to assist a close support through the grieving process, including importance of allowing them time to cry, validating their feelings, encouraging time for self-care activities together for stress outlet, and offering linkage to grief/loss support groups and/or one on one grief counseling with a professional.  Intervention was effective, as evidenced by Kyle Hood reporting that she has been doing some of these things already for the partner, so discussing this provided relief that current approach is appropriate.  She reported that she would also try to find activities they can do together as well to avoid excess rumination upon the loss, stating "We went to a [employer] over the weekend and found some good classical CD's and films to listen to, so that was a nice distraction for both of Kyle Hood".  Kyle Hood reported that she was not feeling well today due to consuming "Bad coffee creamer"  the other day, and requested to end session early due to nausea and queasiness.  Clinician encouraged Kyle Hood to outreach PCP for guidance with management of these symptoms, and will continue to monitor.       Plan: Follow up again in 1 month.    Diagnosis: Social Anxiety Disorder; Major Depressive Disorder, recurrent, moderate; and Gender Dysphoria   Noralee Stain, [address], LCAS 07/12/20

## 2020-07-14 ENCOUNTER — Telehealth (INDEPENDENT_AMBULATORY_CARE_PROVIDER_SITE_OTHER): Payer: Medicare HMO | Admitting: Psychiatry

## 2020-07-14 ENCOUNTER — Encounter: Payer: Self-pay | Admitting: Psychiatry

## 2020-07-14 ENCOUNTER — Other Ambulatory Visit: Payer: Self-pay

## 2020-07-14 DIAGNOSIS — F331 Major depressive disorder, recurrent, moderate: Secondary | ICD-10-CM | POA: Diagnosis not present

## 2020-07-14 DIAGNOSIS — F4010 Social phobia, unspecified: Secondary | ICD-10-CM

## 2020-07-14 DIAGNOSIS — F410 Panic disorder [episodic paroxysmal anxiety] without agoraphobia: Secondary | ICD-10-CM

## 2020-07-14 NOTE — Progress Notes (Signed)
Virtual Visit via Telephone Note  I connected with Kyle Hood on 07/14/20 at 10:00 AM EDT by telephone and verified that I am speaking with the correct person using two identifiers.  Location Provider Location : [facility] Patient Location : Home  Participants: Patient , Provider    I discussed the limitations, risks, security and privacy concerns of performing an evaluation and management service by telephone and the availability of in person appointments. I also discussed with the patient that there may be a patient responsible charge related to this service. The patient expressed understanding and agreed to proceed.    I discussed the assessment and treatment plan with the patient. The patient was provided an opportunity to ask questions and all were answered. The patient agreed with the plan and demonstrated an understanding of the instructions.   The patient was advised to call back or seek an in-person evaluation if the symptoms worsen or if the condition fails to improve as anticipated.   BH MD OP Progress Note  07/14/2020 12:20 PM Kyle Hood  MRN:  [id number]  Chief Complaint:  Chief Complaint    Follow-up; Anxiety     HPI: Kyle Hood is a 65 year old transgender male to male, lives in [place], has a history of MDD, panic attacks, social anxiety disorder was evaluated by telemedicine today.  Patient today reports they are currently going through grief.  Their partner's stepbrother passed away suddenly due to heart problems.  Since then they have been struggling with some interpersonal problems with family as well as grief.  Patient reports sadness, low mood, lack of motivation.   Patient also reports excessive sleep during the day.  Currently sleeps 7 to 9 hours which according to patient is excessive for them.  Patient denies any suicidality or homicidality.  Patient denies perceptual disturbances.  Patient reports appetite is increased at times.  Patient reports  that is their way to cope with stress.  Patient is trying to manage that by working on coping techniques.  Patient continues to follow-up with therapist and reports therapy sessions are beneficial.  Visit Diagnosis:    ICD-10-CM   1. Major depressive disorder, recurrent, moderate (HCC)  F33.1   2. Social anxiety disorder  F40.10   3. Panic disorder  F41.0     Past Psychiatric History: I have reviewed past psychiatric history from my progress note on 06/05/2017.  Past trials of Zoloft, Wellbutrin  Past Medical History:  Past Medical History:  Diagnosis Date  . Abnormal liver function test   . Anxiety   . Anxiety   . Arthritis   . Chronic lower back pain   . Degenerative joint disease   . Depression   . Fatty liver   . Heart murmur   . Hepatitis B   . Hyperlipidemia   . Hypertension   . Lumbar polyradiculopathy   . Panic attacks   . Polyneuropathy   . Vertigo 05/03/2017   ER    Past Surgical History:  Procedure Laterality Date  . COLONOSCOPY    . COLONOSCOPY WITH PROPOFOL N/A 10/01/2017   Procedure: COLONOSCOPY WITH PROPOFOL;  Surgeon: Toledo, Boykin Nearing, MD;  Location: [facility] ENDOSCOPY;  Service: Endoscopy;  Laterality: N/A;  . PROSTATE BIOPSY      Family Psychiatric History: I have reviewed family psychiatric history from my progress note on 06/05/2017  Family History:  Family History  Problem Relation Age of Onset  . Cancer Mother        Pancreatic  . Diabetes  Mother   . Hypertension Mother   . Varicose Veins Mother   . Alcohol abuse Mother   . Heart disease Father        CABG  . Cancer Father        Jaw  . Aortic aneurysm Father   . Congestive Heart Failure Father   . Hypertension Father   . Varicose Veins Father   . Alcohol abuse Father   . Bipolar disorder Sister   . Bipolar disorder Brother     Social History: I have reviewed social history from my progress note on 06/05/2017 Social History   Socioeconomic History  . Marital status: Significant Other     Spouse name: denise  . Number of children: 0  . Years of education: Not on file  . Highest education level: 11th grade  Occupational History    Comment: disabled  Tobacco Use  . Smoking status: Never Smoker  . Smokeless tobacco: Never Used  Vaping Use  . Vaping Use: Never used  Substance and Sexual Activity  . Alcohol use: No  . Drug use: No  . Sexual activity: Not Currently  Other Topics Concern  . Not on file  Social History Narrative  . Not on file   Social Determinants of Health   Financial Resource Strain: Not on file  Food Insecurity: Not on file  Transportation Needs: Not on file  Physical Activity: Not on file  Stress: Not on file  Social Connections: Not on file    Allergies:  Allergies  Allergen Reactions  . Lisinopril Cough    Metabolic Disorder Labs: Lab Results  Component Value Date   HGBA1C 6.1 08/16/2014   No results found for: PROLACTIN Lab Results  Component Value Date   CHOL 145 08/16/2014   TRIG 320 (A) 08/16/2014   HDL 36 08/16/2014   LDLCALC 45 08/16/2014   Lab Results  Component Value Date   TSH 1.61 08/16/2014    Therapeutic Level Labs: No results found for: LITHIUM No results found for: VALPROATE No components found for:  CBMZ  Current Medications: Current Outpatient Medications  Medication Sig Dispense Refill  . diclofenac sodium (VOLTAREN) 1 % GEL Apply topically as needed.     . hydrochlorothiazide (HYDRODIURIL) 25 MG tablet Take 25 mg by mouth daily.    . hydrOXYzine (ATARAX/VISTARIL) 25 MG tablet Take 0.5-1 tablets (12.5-25 mg total) by mouth 3 (three) times daily as needed. For severe anxiety 90 tablet 1  . naproxen (NAPROSYN) 500 MG tablet TK 1 T PO BID WC  3  . pravastatin (PRAVACHOL) 10 MG tablet Take 10 mg by mouth daily.    . QUEtiapine (SEROQUEL) 25 MG tablet TAKE 1 TABLET(25 MG) BY MOUTH AT BEDTIME FOR MOOD OR SLEEP 90 tablet 1  . valsartan (DIOVAN) 40 MG tablet Take by mouth.    . valsartan (DIOVAN) 40 MG  tablet Take 40 mg by mouth daily.    . Vilazodone HCl (VIIBRYD) 20 MG TABS TAKE 1 TABLET(20 MG) BY MOUTH DAILY 90 tablet 1   No current facility-administered medications for this visit.     Musculoskeletal: Strength & Muscle Tone: UTA Gait & Station: UTA Patient leans: N/A  Psychiatric Specialty Exam: Review of Systems  Psychiatric/Behavioral: Positive for dysphoric mood and sleep disturbance.       Grieving  All other systems reviewed and are negative.   There were no vitals taken for this visit.There is no height or weight on file to calculate BMI.  General  Appearance: UTA  Eye Contact:  UTA  Speech:  Normal Rate  Volume:  Normal  Mood:  Depressed and Grieving  Affect:  UTA  Thought Process:  Goal Directed and Descriptions of Associations: Intact  Orientation:  Full (Time, Place, and Person)  Thought Content: Logical   Suicidal Thoughts:  No  Homicidal Thoughts:  No  Memory:  Immediate;   Fair Recent;   Fair Remote;   Fair  Judgement:  Fair  Insight:  Fair  Psychomotor Activity:  UTA  Concentration:  Concentration: Fair and Attention Span: Fair  Recall:  [employer] of Knowledge: Fair  Language: Fair  Akathisia:  No  Handed:  Right  AIMS (if indicated): UTA  Assets:  Communication Skills Desire for Improvement Housing Social Support  ADL's:  Intact  Cognition: WNL  Sleep:  Excessive   Screenings: GAD-7   Flowsheet Row Counselor from 06/14/2020 in BEHAVIORAL HEALTH OUTPATIENT THERAPY [place]  Total GAD-7 Score 9    PHQ2-9   Flowsheet Row Video Visit from 07/14/2020 in [facility] Counselor from 06/14/2020 in BEHAVIORAL HEALTH OUTPATIENT THERAPY [place] Office Visit from 02/03/2019 in [facility] Office Visit from 04/02/2018 in [facility] Procedure visit from 02/25/2018 in [facility]  PHQ-2 Total Score 5 2 3  0 0   PHQ-9 Total Score 17 7 7  -- --    Flowsheet Row Video Visit from 07/14/2020 in [facility] Counselor from 07/12/2020 in BEHAVIORAL HEALTH OUTPATIENT THERAPY [place]  C-SSRS RISK CATEGORY No Risk No Risk       Assessment and Plan: Kyle Hood is a 65 year old male to male transgender, has a history of depression, anxiety, insomnia was evaluated by telemedicine today.  Patient is currently struggling with depression and grief.  Discussed plan as noted below.  Plan Depression-mild-unstable Discussed increasing Viibryd to 40 mg p.o. daily Patient however declines.  Patient reports they want to try psychotherapy sessions before making any changes.  Will contact therapist for more frequent sessions. Continue Seroquel as prescribed  Anxiety disorder-Unstable Continue CBT with Mr. Kyle Hood Hydroxyzine 25 mg p.o. 3 times daily as needed for severe anxiety attacks Continue Viibryd as prescribed since patient is not interested in making medication changes today.  Insomnia-Unstable Seroquel 12.5-25 mg p.o. nightly  Follow-up in clinic in 3 to 4 weeks in person.   I have spent at least 18 minutes non face to face with patient today which includes the time spent for preparing to see the patient  (e.g., review of test, records), ordering medications and test, psychoeducation and supportive psychotherapy, care coordination as well as documenting clinical information in electronic health record.  This note was generated in part or whole with voice recognition software. Voice recognition is usually quite accurate but there are transcription errors that can and very often do occur. I apologize for any typographical errors that were not detected and corrected.            [name] Mayo, MD 07/14/2020, 12:20 PM

## 2020-08-17 ENCOUNTER — Encounter: Payer: Self-pay | Admitting: Psychiatry

## 2020-08-17 ENCOUNTER — Ambulatory Visit (INDEPENDENT_AMBULATORY_CARE_PROVIDER_SITE_OTHER): Payer: Medicare HMO | Admitting: Psychiatry

## 2020-08-17 ENCOUNTER — Other Ambulatory Visit: Payer: Self-pay

## 2020-08-17 VITALS — BP 153/82 | HR 83 | Temp 98.5°F | Wt 281.0 lb

## 2020-08-17 DIAGNOSIS — F4010 Social phobia, unspecified: Secondary | ICD-10-CM | POA: Diagnosis not present

## 2020-08-17 DIAGNOSIS — F410 Panic disorder [episodic paroxysmal anxiety] without agoraphobia: Secondary | ICD-10-CM

## 2020-08-17 DIAGNOSIS — F3342 Major depressive disorder, recurrent, in full remission: Secondary | ICD-10-CM | POA: Diagnosis not present

## 2020-08-17 MED ORDER — QUETIAPINE FUMARATE 25 MG PO TABS: ORAL_TABLET | ORAL | 1 refills | Status: DC

## 2020-08-17 MED ORDER — HYDROXYZINE HCL 25 MG PO TABS: 12.5000 mg | ORAL_TABLET | Freq: Three times a day (TID) | ORAL | 1 refills | Status: DC | PRN

## 2020-08-17 MED ORDER — VIIBRYD 20 MG PO TABS: ORAL_TABLET | ORAL | 1 refills | Status: DC

## 2020-08-17 NOTE — Progress Notes (Signed)
BH MD OP Progress Note  08/17/2020 10:38 AM Kyle Hood  MRN:  [id number]  Chief Complaint:  Chief Complaint    Follow-up; Anxiety; Depression     HPI: Kyle Hood is a 65 year old transgender male to male, lives in [place], has a history of MDD, panic attacks, social anxiety disorder was evaluated in office today.  Patient today reports they are currently doing well.  Although there are several psychosocial stressors going on right now which includes the pandemic, the war, they are able to cope with it.  Reports the situation with the housing has definitely improved since the neighbor who was causing a lot of trouble moved away.  Reports sleep is overall okay.  Uses the Seroquel only as needed.  Denies side effects.  Reports appetite is fair.  Not binging on food.  Denies suicidality, homicidality.  Does report a couple of episodes of seeing a spider when they were waking up from sleep.  However has not happened again.  Continues to be in therapy with Kyle Hood and that has been very helpful.  Patient is compliant on the Viibryd.  Denies any other concerns today.    Visit Diagnosis:    ICD-10-CM   1. MDD (major depressive disorder), recurrent, in full remission (HCC)  F33.42 QUEtiapine (SEROQUEL) 25 MG tablet  2. Social anxiety disorder  F40.10 Vilazodone HCl (VIIBRYD) 20 MG TABS  3. Panic disorder  F41.0 hydrOXYzine (ATARAX/VISTARIL) 25 MG tablet    QUEtiapine (SEROQUEL) 25 MG tablet    Past Psychiatric History: Reviewed past psychiatric history from progress note on 06/05/2017.  Past trials of Zoloft, Wellbutrin  Past Medical History:  Past Medical History:  Diagnosis Date  . Abnormal liver function test   . Anxiety   . Anxiety   . Arthritis   . Chronic lower back pain   . Degenerative joint disease   . Depression   . Fatty liver   . Heart murmur   . Hepatitis B   . Hyperlipidemia   . Hypertension   . Lumbar polyradiculopathy   . Panic attacks   .  Polyneuropathy   . Vertigo 05/03/2017   ER    Past Surgical History:  Procedure Laterality Date  . COLONOSCOPY    . COLONOSCOPY WITH PROPOFOL N/A 10/01/2017   Procedure: COLONOSCOPY WITH PROPOFOL;  Surgeon: Toledo, Boykin Nearing, MD;  Location: [facility] ENDOSCOPY;  Service: Endoscopy;  Laterality: N/A;  . PROSTATE BIOPSY      Family Psychiatric History: I have reviewed family psychiatric history from progress note on 06/05/2017  Family History:  Family History  Problem Relation Age of Onset  . Cancer Mother        Pancreatic  . Diabetes Mother   . Hypertension Mother   . Varicose Veins Mother   . Alcohol abuse Mother   . Heart disease Father        CABG  . Cancer Father        Jaw  . Aortic aneurysm Father   . Congestive Heart Failure Father   . Hypertension Father   . Varicose Veins Father   . Alcohol abuse Father   . Bipolar disorder Sister   . Bipolar disorder Brother     Social History: Reviewed social history from my progress note on 06/05/2017 Social History   Socioeconomic History  . Marital status: Significant Other    Spouse name: denise  . Number of children: 0  . Years of education: Not on file  . Highest  education level: 11th grade  Occupational History    Comment: disabled  Tobacco Use  . Smoking status: Never Smoker  . Smokeless tobacco: Never Used  Vaping Use  . Vaping Use: Never used  Substance and Sexual Activity  . Alcohol use: No  . Drug use: No  . Sexual activity: Not Currently  Other Topics Concern  . Not on file  Social History Narrative  . Not on file   Social Determinants of Health   Financial Resource Strain: Not on file  Food Insecurity: Not on file  Transportation Needs: Not on file  Physical Activity: Not on file  Stress: Not on file  Social Connections: Not on file    Allergies:  Allergies  Allergen Reactions  . Lisinopril Cough    Metabolic Disorder Labs: Lab Results  Component Value Date   HGBA1C 6.1 08/16/2014   No  results found for: PROLACTIN Lab Results  Component Value Date   CHOL 145 08/16/2014   TRIG 320 (A) 08/16/2014   HDL 36 08/16/2014   LDLCALC 45 08/16/2014   Lab Results  Component Value Date   TSH 1.61 08/16/2014    Therapeutic Level Labs: No results found for: LITHIUM No results found for: VALPROATE No components found for:  CBMZ  Current Medications: Current Outpatient Medications  Medication Sig Dispense Refill  . hydrochlorothiazide (HYDRODIURIL) 25 MG tablet Take 25 mg by mouth daily.    . naproxen (NAPROSYN) 500 MG tablet TK 1 T PO BID WC  3  . pravastatin (PRAVACHOL) 10 MG tablet Take 10 mg by mouth daily.    . valsartan (DIOVAN) 40 MG tablet Take 40 mg by mouth daily.    . hydrOXYzine (ATARAX/VISTARIL) 25 MG tablet Take 0.5-1 tablets (12.5-25 mg total) by mouth 3 (three) times daily as needed. For severe anxiety 90 tablet 1  . QUEtiapine (SEROQUEL) 25 MG tablet TAKE 1 TABLET(25 MG) BY MOUTH AT BEDTIME FOR MOOD OR SLEEP 90 tablet 1  . Vilazodone HCl (VIIBRYD) 20 MG TABS TAKE 1 TABLET(20 MG) BY MOUTH DAILY 90 tablet 1   No current facility-administered medications for this visit.     Musculoskeletal: Strength & Muscle Tone: UTA Gait & Station: normal Patient leans: N/A  Psychiatric Specialty Exam: Review of Systems  Psychiatric/Behavioral: The patient is nervous/anxious.   All other systems reviewed and are negative.   Blood pressure (!) 153/82, pulse 83, temperature 98.5 F (36.9 C), temperature source Temporal, weight 281 lb (127.5 kg).Body mass index is 36.08 kg/m.  General Appearance: Casual  Eye Contact:  Fair  Speech:  Clear and Coherent  Volume:  Normal  Mood:  Anxious coping well  Affect:  Congruent  Thought Process:  Goal Directed and Descriptions of Associations: Intact  Orientation:  Full (Time, Place, and Person)  Thought Content: Logical   Suicidal Thoughts:  No  Homicidal Thoughts:  No  Memory:  Immediate;   Fair Recent;   Fair Remote;    Fair  Judgement:  Fair  Insight:  Fair  Psychomotor Activity:  Normal  Concentration:  Concentration: Fair and Attention Span: Fair  Recall:  [employer] of Knowledge: Fair  Language: Fair  Akathisia:  No  Handed:  Right  AIMS (if indicated): done  Assets:  Communication Skills Desire for Improvement Housing Social Support  ADL's:  Intact  Cognition: WNL  Sleep:  Fair   Screenings: GAD-7   Flowsheet Row Counselor from 06/14/2020 in BEHAVIORAL HEALTH OUTPATIENT THERAPY [place]  Total GAD-7 Score 9  PHQ2-9   Flowsheet Row Office Visit from 08/17/2020 in [facility] Video Visit from 07/14/2020 in [facility] Counselor from 06/14/2020 in BEHAVIORAL HEALTH OUTPATIENT THERAPY [place] Office Visit from 02/03/2019 in [name] Office Visit from 04/02/2018 in [facility]  PHQ-2 Total Score 2 5 2 3  0  PHQ-9 Total Score 4 17 7 7  --    Flowsheet Row Office Visit from 08/17/2020 in [facility] Video Visit from 07/14/2020 in [facility] Counselor from 07/12/2020 in BEHAVIORAL HEALTH OUTPATIENT THERAPY   C-SSRS RISK CATEGORY No Risk No Risk No Risk       Assessment and Plan: Kyle Hood is a 65 year old male to male transgender who has a history of depression, anxiety, insomnia was evaluated in the office today.  Patient is currently stable on current medication regimen  Plan  Depression- in remission Viibryd 20 mg p.o. daily Continue Seroquel 12.5-25 mg p.o. nightly  Anxiety disorder- stable Continue CBT with Mr. 65 Hydroxyzine 25 mg p.o. 3 times daily as needed for anxiety attacks  Insomnia- stable Seroquel 12.5-25 mg p.o. nightly.  Patient advised to follow-up with primary care provider for elevated blood pressure reading today.  Follow-up in clinic in 2 to 3 months or  sooner if needed.  This note was generated in part or whole with voice recognition software. Voice recognition is usually quite accurate but there are transcription errors that can and very often do occur. I apologize for any typographical errors that were not detected and corrected.        [name] Mayo, MD 08/18/2020, 7:59 AM

## 2020-08-23 ENCOUNTER — Ambulatory Visit (INDEPENDENT_AMBULATORY_CARE_PROVIDER_SITE_OTHER): Payer: Medicare HMO | Admitting: Licensed Clinical Social Worker

## 2020-08-23 ENCOUNTER — Other Ambulatory Visit: Payer: Self-pay

## 2020-08-23 DIAGNOSIS — F640 Transsexualism: Secondary | ICD-10-CM | POA: Diagnosis not present

## 2020-08-23 DIAGNOSIS — F4010 Social phobia, unspecified: Secondary | ICD-10-CM

## 2020-08-23 DIAGNOSIS — F331 Major depressive disorder, recurrent, moderate: Secondary | ICD-10-CM

## 2020-08-23 NOTE — Progress Notes (Signed)
Virtual Visit via Telephone Note   I connected with [name] on 08/23/20 at 1:00pm by telephone and verified that I am speaking with the correct person using two identifiers.   I discussed the limitations, risks, security and privacy concerns of performing an evaluation and management service by telephone and the availability of in person appointments. I also discussed with the patient that there may be a patient responsible charge related to this service. The patient expressed understanding and agreed to proceed.   I discussed the assessment and treatment plan with the patient. The patient was provided an opportunity to ask questions and all were answered. The patient agreed with the plan and demonstrated an understanding of the instructions.   The patient was advised to call back or seek an in-person evaluation if the symptoms worsen or if the condition fails to improve as anticipated.   I provided 30 minutes of non-face-to-face time during this encounter.     Noralee Stain, LCSW, LCAS _________________________ THERAPIST PROGRESS NOTE   Session Time: 1:00pm - 1:30pm  Location: Patient: Patient Home Provider: [facility] OPT Office   Participation Level: Active   Behavioral Response: Alert, depressed mood   Type of Therapy:  Individual Therapy   Treatment Goals addressed: Medication compliance; Anxiety and depression management    Interventions: CBT, strengths based, problem solving    Summary: Jovaun Levene is a 65 year old Caucasian transgender male to male that presented for therapy session today with diagnoses of Social anxiety disorder; Major depressive disorder, recurrent, moderate; and Gender dysphoria.     Suicidal/Homicidal: None; without plan or intent.    Therapist Response: Clinician contacted [name] by phone today due to ongoing inability for her to access video-enabled sessions or travel to meet in person.  Clinician assessed for safety, medication compliance, and  sobriety.  Jamonta answered this phone call on time and spoke in a manner that was alert, oriented x5, with no evidence or self-report of active SI/HI or A/V H.  Garritt reported that she continues taking medication responsibly and denied use of alcohol or illicit substances.  Clinician inquired about Denilson's current emotional ratings, as well as any significant changes in thoughts, feelings, or behavior since previous check-in.  Marvyn reported scores of 5/10 for depression, 5/10 for anxiety, 4/10 for anger/irritability, and noted two panic attacks since last session.  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  Clinician validated Sears's concerns, and assisted her by utilizing the [employer] to research the what the state requirements are for maximum income while maintaining current disability benefits, and encouraged her to [employer] for further clarity, providing their national number.  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 Interventions were effective, as evidenced by [name] reporting that this would not only provide greater motivation for engaging in healthy self-care activities, but also improve chances of being able to afford to move to a safer, less stressful area.  Hargis stated "Thank you for giving me that information, maybe I can reach them today".  Clinician will continue to monitor.      Plan: Follow up again in 1 month.    Diagnosis: Social Anxiety Disorder; Major Depressive Disorder, recurrent, moderate; and Gender Dysphoria   Kandee Keen  Jenne Pane, LCSW, LCAS 08/23/20

## 2020-09-18 ENCOUNTER — Ambulatory Visit (INDEPENDENT_AMBULATORY_CARE_PROVIDER_SITE_OTHER): Payer: Medicare HMO | Admitting: Licensed Clinical Social Worker

## 2020-09-18 ENCOUNTER — Other Ambulatory Visit: Payer: Self-pay

## 2020-09-18 DIAGNOSIS — F640 Transsexualism: Secondary | ICD-10-CM | POA: Diagnosis not present

## 2020-09-18 DIAGNOSIS — F4010 Social phobia, unspecified: Secondary | ICD-10-CM

## 2020-09-18 DIAGNOSIS — F331 Major depressive disorder, recurrent, moderate: Secondary | ICD-10-CM | POA: Diagnosis not present

## 2020-09-18 NOTE — Progress Notes (Signed)
Virtual Visit via Telephone Note   I connected with Kyle Hood on 09/18/20 at 4:00pm by telephone and verified that I am speaking with the correct person using two identifiers.   I discussed the limitations, risks, security and privacy concerns of performing an evaluation and management service by telephone and the availability of in person appointments. I also discussed with the patient that there may be a patient responsible charge related to this service. The patient expressed understanding and agreed to proceed.   I discussed the assessment and treatment plan with the patient. The patient was provided an opportunity to ask questions and all were answered. The patient agreed with the plan and demonstrated an understanding of the instructions.   The patient was advised to call back or seek an in-person evaluation if the symptoms worsen or if the condition fails to improve as anticipated.   I provided 30 minutes of non-face-to-face time during this encounter.     Noralee Stain, LCSW, LCAS _________________________ THERAPIST PROGRESS NOTE   Session Time: 4:00pm - 4:30pm  Location: Patient: Patient Home Provider: Clinical Home Office   Participation Level: Active   Behavioral Response: Alert, anxious mood   Type of Therapy:  Individual Therapy   Treatment Goals addressed: Medication compliance; Anxiety and depression management    Interventions: CBT, progressive muscle relaxation    Summary: Kyle Hood is a 65 year old Caucasian transgender male to male that presented for therapy session today with diagnoses of Social anxiety disorder; Major depressive disorder, recurrent, moderate; and Gender dysphoria.     Suicidal/Homicidal: None; without plan or intent.    Therapist Response: Kyle Hood contacted Kyle Hood by phone today due to ongoing inability for Kyle Hood to access video-enabled sessions or travel to meet in person.  Kyle Hood assessed for safety, medication compliance, and  sobriety.  Kyle Hood answered this phone call on time and spoke in a manner that was alert, oriented x5, with no evidence or self-report of active SI/HI or A/V H.  Kyle Hood reported ongoing compliance with medication and denied use of alcohol or illicit substances.  Kyle Hood inquired about Kyle Hood's emotional ratings today, as well as any significant changes in thoughts, feelings, or behavior since last check-in.  Kyle Hood reported scores of 4/10 for depression, 7/10 for anxiety, 5/10 for anger/irritability, and denied any panic attacks or outbursts.  Kyle Hood reported that Kyle Hood has been maintaining healthier social media boundaries due to the impact this can have upon Kyle Hood mental health, but noted that rising prices for food, gas, and other necessities have made Kyle Hood feel more uneasy and tense.  Kyle Hood reported that Kyle Hood has attempted to cope by painting more often, stating "I'm grateful to have something to plug myself into because when I'm painting I'm not in as much arthritic pain".  Kyle Hood offered to teach Kyle Hood how to perform progressive muscle relaxation exercise today to alleviate anxiety/stress related tension within the body. Kyle Hood was agreeable to this suggestion, so Kyle Hood guided Kyle Hood through process of creating comfortable practice space, achieving relaxed breathing pattern, and then moving sequentially through the various muscle groups of the body (I.e. feet, legs, chest, arms, head, etc), tensing muscles for 5 seconds and then relaxing for 10 seconds over course of 10 minutes practice.  Intervention was effective, as evidenced by Kyle Hood participating in activity successfully and reporting decrease of tension in areas such as neck, arms and shoulders.  Kyle Hood stated "I feel much more relaxed now and would definitely try it again.  It seems simple enough to remember".  Kyle Hood  encouraged Kyle Hood to add this to developing self-care routine due to positive response and will continue to monitor.       Plan: Follow up again in 1  month.    Diagnosis: Social Anxiety Disorder; Major Depressive Disorder, recurrent, moderate; and Gender Dysphoria   Noralee Stain, [address], LCAS 09/18/20

## 2020-10-03 ENCOUNTER — Other Ambulatory Visit: Payer: Self-pay | Admitting: Psychiatry

## 2020-10-03 DIAGNOSIS — F4010 Social phobia, unspecified: Secondary | ICD-10-CM

## 2020-10-25 ENCOUNTER — Ambulatory Visit (INDEPENDENT_AMBULATORY_CARE_PROVIDER_SITE_OTHER): Payer: Medicare HMO | Admitting: Licensed Clinical Social Worker

## 2020-10-25 ENCOUNTER — Other Ambulatory Visit: Payer: Self-pay

## 2020-10-25 DIAGNOSIS — F640 Transsexualism: Secondary | ICD-10-CM

## 2020-10-25 DIAGNOSIS — F331 Major depressive disorder, recurrent, moderate: Secondary | ICD-10-CM

## 2020-10-25 DIAGNOSIS — F4010 Social phobia, unspecified: Secondary | ICD-10-CM | POA: Diagnosis not present

## 2020-10-25 NOTE — Progress Notes (Signed)
Virtual Visit via Telephone Note   I connected with Kyle Hood on 10/25/20 at 1:00pm by telephone and verified that I am speaking with the correct person using two identifiers.   I discussed the limitations, risks, security and privacy concerns of performing an evaluation and management service by telephone and the availability of in person appointments. I also discussed with the patient that there may be a patient responsible charge related to this service. The patient expressed understanding and agreed to proceed.   I discussed the assessment and treatment plan with the patient. The patient was provided an opportunity to ask questions and all were answered. The patient agreed with the plan and demonstrated an understanding of the instructions.   The patient was advised to call back or seek an in-person evaluation if the symptoms worsen or if the condition fails to improve as anticipated.   I provided 30 minutes of non-face-to-face time during this encounter.     Kyle Stain, LCSW, LCAS _________________________ THERAPIST PROGRESS NOTE   Session Time: 1:00pm - 1:30pm   Location: Patient: Patient Home Provider: [facility] Office   Participation Level: Active   Behavioral Response: Alert, anxious mood   Type of Therapy:  Individual Therapy   Treatment Goals addressed: Medication compliance; Anxiety and depression management    Interventions: CBT, mindfulness    Summary: Kyle Hood is a 65 year old Caucasian transgender male to male that presented for therapy session today with diagnoses of Social anxiety disorder; Major depressive disorder, recurrent, moderate; and Gender dysphoria.     Suicidal/Homicidal: None; without plan or intent.   Therapist Response: Clinician contacted Kyle Hood by phone today due to ongoing inability for her to access video-enabled sessions or travel to meet in person.  Clinician assessed for safety, medication compliance, and sobriety.  Kyle Hood answered this  phone call on time and spoke in a manner that was alert, oriented x5, with no evidence or self-report of active SI/HI or A/V H.  Kyle Hood reported that she continues taking medication as prescribed and denied use of alcohol or illicit substances.  Clinician inquired about Kyle Hood's current emotional ratings, as well as any significant changes in thoughts, feelings, or behavior since previous check-in.  Kyle Hood reported scores of 5/10 for depression, 8/10 for anxiety, 6/10 for irritability, 7/10 for pain, and roughly 4 panic attacks over past month due to heightened stress.  Kyle Hood reported that she has been struggling with symptoms of COVID for two weeks now, in addition to pulling a muscle in her back, stating "I've been in my bed a lot and trying to be careful about the things I do each day".  Kyle Hood denied speaking with any medical professionals about her COVID symptoms, noting that she has developed distrust of vaccine and doctors in general following pandemic.  She stated "I'm just hoping I'll build up natural immunity from it".  Clinician encouraged Kyle Hood to carefully monitor progression of symptoms and seek medical assistance if condition worsens.  Clinician offered to teach Kyle Hood a mindfulness meditation exercise today focused on pain relief, and she was agreeable to this.  Clinician guided Kyle Hood through process of getting comfortable, achieving relaxing breathing rhythm, and then practicing combination activity involving comprehensive body scan, and reciting pain relief affirmations. Intervention was effective, as evidenced by Kyle Hood successfully participating in this activity and reporting that her pain level reduced to 4/10 in severity, and felt less sharp overall, stating "I used the word 'float' as my grounding word and took the focus off my lower back.  I was able to accept how I felt and this really did help".  Kyle Hood reported that she would add this practice to self-care routine due to noted benefits, and follow up in  one month as agreed.  Clinician will continue to monitor.           Plan: Follow up again in 1 month.    Diagnosis: Social Anxiety Disorder; Major Depressive Disorder, recurrent, moderate; and Gender Dysphoria   Kyle Hood, [address], LCAS 10/25/20

## 2020-11-16 ENCOUNTER — Ambulatory Visit: Payer: Medicare HMO | Admitting: Psychiatry

## 2020-11-16 ENCOUNTER — Ambulatory Visit: Payer: Self-pay | Admitting: Student in an Organized Health Care Education/Training Program

## 2020-11-22 ENCOUNTER — Ambulatory Visit (INDEPENDENT_AMBULATORY_CARE_PROVIDER_SITE_OTHER): Payer: Medicare HMO | Admitting: Licensed Clinical Social Worker

## 2020-11-22 ENCOUNTER — Other Ambulatory Visit: Payer: Self-pay

## 2020-11-22 DIAGNOSIS — F331 Major depressive disorder, recurrent, moderate: Secondary | ICD-10-CM | POA: Diagnosis not present

## 2020-11-22 DIAGNOSIS — F4010 Social phobia, unspecified: Secondary | ICD-10-CM

## 2020-11-22 DIAGNOSIS — F640 Transsexualism: Secondary | ICD-10-CM | POA: Diagnosis not present

## 2020-11-22 NOTE — Progress Notes (Signed)
Virtual Visit via Telephone Note   I connected with Kyle Hood on 11/22/20 at 2:00pm by telephone and verified that I am speaking with the correct person using two identifiers.   I discussed the limitations, risks, security and privacy concerns of performing an evaluation and management service by telephone and the availability of in person appointments. I also discussed with the patient that there may be a patient responsible charge related to this service. The patient expressed understanding and agreed to proceed.   I discussed the assessment and treatment plan with the patient. The patient was provided an opportunity to ask questions and all were answered. The patient agreed with the plan and demonstrated an understanding of the instructions.   The patient was advised to call back or seek an in-person evaluation if the symptoms worsen or if the condition fails to improve as anticipated.   I provided 35 minutes of non-face-to-face time during this encounter.     Kyle Stain, LCSW, LCAS _________________________ THERAPIST PROGRESS NOTE   Session Time: 2:00pm - 2:35pm   Location: Patient: Patient Home Provider: [facility]   Participation Level: Active   Behavioral Response: Alert, anxious mood   Type of Therapy:  Individual Therapy   Treatment Goals addressed: Medication compliance; Anxiety and depression management    Interventions: CBT, guided imagery    Summary: Kyle Hood is a 65 year old Caucasian transgender male to male that presented for therapy session today with diagnoses of Social anxiety disorder; Major depressive disorder, recurrent, moderate; and Gender dysphoria.     Suicidal/Homicidal: None; without plan or intent.   Therapist Response: Clinician contacted Kyle Hood by phone today due to ongoing inability for her to access video-enabled appointments or travel to meet in person.  Clinician assessed for safety, medication compliance, and sobriety.  Kyle Hood answered  this phone call on time and spoke in a manner that was alert, oriented x5, with no evidence or self-report of active SI/HI or A/V H.  Kyle Hood reported ongoing compliance with medication and denied use of alcohol or illicit substances.  Clinician inquired about Kyle Hood emotional ratings today, as well as any significant changes in thoughts, feelings, or behavior since last check-in.  Kyle Hood reported scores of 5/10 for depression, 5/10 for anxiety, 5/10 for irritability, and 0/10 for pain.  She reported that she has had 1-2 panic attacks over last month, stating "They seem to be subsiding.  I think I'm taking things slower because of my back".  Kyle Hood reported that she has been trying to stay productive by painting pictures and cleaning up around the home.  She reported that much of her stress has been related to engagement with political groups and related posts on Facebook, which recently led to her account being suspended for something she posted.  Kyle Hood reported that the more she reads about the state of the country and frightening news stories online, she withdraws further from society and finds stress to increase.  Clinician revisited previous suggestions with Kyle Hood on establishing healthy social media boundaries due to the negative impact this can have upon mental health and outlook.  These suggestions included limiting time on social platforms, being mindful of content that could be triggering, and taking temporary social media vacations when it becomes too overwhelming.  Clinician also proposed exploration of relaxation skills today, and Kyle Hood was agreeable to this.  Clinician invited Kyle Hood to participate in peaceful place guided imagery activity and explained how this is a powerful visualization tool which can aid in reducing stress  while increasing sense of calm, control, and awareness if practiced regularly.  Clinician informed Kyle Hood beforehand that if she became uncomfortable at any point during activity, she could  stop and open her eyes.  Clinician invited her to get comfortable, achieve a relaxing breathing rhythm, close her eyes, and then guided her through process of creating a 'peaceful place' which filled her with safety and calm.  Clinician encouraged Kyle Hood to include sensory details involving vision, sound, touch, smell, and taste which she considered pleasant to enhance experience.  After 10 minutes of practice in session, clinician invited Kyle Hood to share her opinion on the activity, including whether she was able to imagine a specific place, what details stood out to her, and how this made her feel during and after.  Intervention was effective, as evidenced by Kyle Hood participating in activity successfully and reporting that she found it to be very calming, as she imagined sitting on a deck overlooking with beach, and could feel a cool breeze, smell the salty air, and hear seagulls flying overhead.  Kyle Hood reported that she would plan to practice this, and stated "It was like something from a movie, very serene".  Clinician will continue to monitor.        Plan: Follow up again in 1 month.    Diagnosis: Social Anxiety Disorder; Major Depressive Disorder, recurrent, moderate; and Gender Dysphoria   Kyle Hood, [address], LCAS 11/22/20

## 2020-11-30 ENCOUNTER — Encounter: Payer: Self-pay | Admitting: Psychiatry

## 2020-11-30 ENCOUNTER — Other Ambulatory Visit: Payer: Self-pay

## 2020-11-30 ENCOUNTER — Ambulatory Visit (INDEPENDENT_AMBULATORY_CARE_PROVIDER_SITE_OTHER): Payer: Medicare HMO | Admitting: Psychiatry

## 2020-11-30 VITALS — BP 162/85 | HR 88

## 2020-11-30 DIAGNOSIS — I10 Essential (primary) hypertension: Secondary | ICD-10-CM

## 2020-11-30 DIAGNOSIS — G4700 Insomnia, unspecified: Secondary | ICD-10-CM | POA: Insufficient documentation

## 2020-11-30 DIAGNOSIS — F410 Panic disorder [episodic paroxysmal anxiety] without agoraphobia: Secondary | ICD-10-CM | POA: Diagnosis not present

## 2020-11-30 DIAGNOSIS — F3342 Major depressive disorder, recurrent, in full remission: Secondary | ICD-10-CM

## 2020-11-30 DIAGNOSIS — Z79899 Other long term (current) drug therapy: Secondary | ICD-10-CM | POA: Insufficient documentation

## 2020-11-30 DIAGNOSIS — F4010 Social phobia, unspecified: Secondary | ICD-10-CM | POA: Diagnosis not present

## 2020-11-30 DIAGNOSIS — F5101 Primary insomnia: Secondary | ICD-10-CM | POA: Insufficient documentation

## 2020-11-30 DIAGNOSIS — Z9189 Other specified personal risk factors, not elsewhere classified: Secondary | ICD-10-CM

## 2020-11-30 NOTE — Progress Notes (Signed)
BH MD OP Progress Note  11/30/2020 11:21 AM Kyle Hood  MRN:  [id number]  Chief Complaint:  Chief Complaint   Follow-up; Anxiety; Depression    HPI: Kyle Hood is a 65 year old transgender male to male, lives in [place], has a history of MDD, panic disorder, social anxiety disorder was evaluated in office today.  Patient today reports they are currently having racing thoughts, rumination on and off.  It usually happens when they start painting a picture.  Patient is interested in painting and has been doing that as a hobby for a while.  It is hard to shut down their mind when they start painting and that can last for a while.  Recently patient also is having sleep problems, sleep is restless due to racing thoughts.  Does have Seroquel available however has not been taking it every night as prescribed.  Patient is currently compliant on the Viibryd which is beneficial.  Patient denies any suicidality, homicidality or perceptual disturbances.  Patient reports partner is supportive.  That helps a lot.  Continues to be in psychotherapy sessions with [name] and that is beneficial.  Patient denies any other concerns today.  Visit Diagnosis:    ICD-10-CM   1. MDD (major depressive disorder), recurrent, in full remission (HCC)  F33.42     2. Social anxiety disorder  F40.10     3. Panic disorder  F41.0     4. Insomnia, unspecified type  G47.00     5. High risk medication use  Z79.899 TSH    Prolactin    6. At risk for prolonged QT interval syndrome  Z91.89 EKG 12-Lead    7. Elevated blood pressure reading in office with diagnosis of hypertension  I10       Past Psychiatric History: Reviewed past psychiatric history from progress note on 06/05/2017.  Past trials of Zoloft, Wellbutrin  Past Medical History:  Past Medical History:  Diagnosis Date   Abnormal liver function test    Anxiety    Anxiety    Arthritis    Chronic lower back pain    Degenerative  joint disease    Depression    Fatty liver    Heart murmur    Hepatitis B    Hyperlipidemia    Hypertension    Lumbar polyradiculopathy    Panic attacks    Polyneuropathy    Vertigo 05/03/2017   ER    Past Surgical History:  Procedure Laterality Date   COLONOSCOPY     COLONOSCOPY WITH PROPOFOL N/A 10/01/2017   Procedure: COLONOSCOPY WITH PROPOFOL;  Surgeon: Toledo, Boykin Nearing, MD;  Location: [facility] ENDOSCOPY;  Service: Endoscopy;  Laterality: N/A;   PROSTATE BIOPSY      Family Psychiatric History: Reviewed family psychiatric history from progress note on 06/05/2017.  Family History:  Family History  Problem Relation Age of Onset   Cancer Mother        Pancreatic   Diabetes Mother    Hypertension Mother    Varicose Veins Mother    Alcohol abuse Mother    Heart disease Father        CABG   Cancer Father        Jaw   Aortic aneurysm Father    Congestive Heart Failure Father    Hypertension Father    Varicose Veins Father    Alcohol abuse Father    Bipolar disorder Sister    Bipolar disorder Brother     Social History: Reviewed social history  from progress note on 06/05/2017. Social History   Socioeconomic History   Marital status: Significant Other    Spouse name: denise   Number of children: 0   Years of education: Not on file   Highest education level: 11th grade  Occupational History    Comment: disabled  Tobacco Use   Smoking status: Never   Smokeless tobacco: Never  Vaping Use   Vaping Use: Never used  Substance and Sexual Activity   Alcohol use: No   Drug use: No   Sexual activity: Not Currently  Other Topics Concern   Not on file  Social History Narrative   Not on file   Social Determinants of Health   Financial Resource Strain: Not on file  Food Insecurity: Not on file  Transportation Needs: Not on file  Physical Activity: Not on file  Stress: Not on file  Social Connections: Not on file    Allergies:  Allergies  Allergen Reactions    Lisinopril Cough    Metabolic Disorder Labs: Lab Results  Component Value Date   HGBA1C 6.1 08/16/2014   No results found for: PROLACTIN Lab Results  Component Value Date   CHOL 145 08/16/2014   TRIG 320 (A) 08/16/2014   HDL 36 08/16/2014   LDLCALC 45 08/16/2014   Lab Results  Component Value Date   TSH 1.61 08/16/2014    Therapeutic Level Labs: No results found for: LITHIUM No results found for: VALPROATE No components found for:  CBMZ  Current Medications: Current Outpatient Medications  Medication Sig Dispense Refill   hydrochlorothiazide (HYDRODIURIL) 25 MG tablet Take 25 mg by mouth daily.     hydrOXYzine (ATARAX/VISTARIL) 25 MG tablet Take 0.5-1 tablets (12.5-25 mg total) by mouth 3 (three) times daily as needed. For severe anxiety 90 tablet 1   naproxen (NAPROSYN) 500 MG tablet TK 1 T PO BID WC  3   pravastatin (PRAVACHOL) 10 MG tablet Take 10 mg by mouth daily.     QUEtiapine (SEROQUEL) 25 MG tablet TAKE 1 TABLET(25 MG) BY MOUTH AT BEDTIME FOR MOOD OR SLEEP 90 tablet 1   valsartan (DIOVAN) 40 MG tablet Take 40 mg by mouth daily.     Vilazodone HCl (VIIBRYD) 20 MG TABS TAKE 1 TABLET(20 MG) BY MOUTH DAILY 90 tablet 1   No current facility-administered medications for this visit.     Musculoskeletal: Strength & Muscle Tone: within normal limits Gait & Station: normal Patient leans: N/A  Psychiatric Specialty Exam: Review of Systems  Psychiatric/Behavioral:  Positive for sleep disturbance. The patient is nervous/anxious.   All other systems reviewed and are negative.  Blood pressure (!) 162/85, pulse 88.There is no height or weight on file to calculate BMI.  General Appearance: Casual  Eye Contact:  Fair  Speech:  Clear and Coherent  Volume:  Normal  Mood:  Anxious  Affect:  Congruent  Thought Process:  Goal Directed and Descriptions of Associations: Intact  Orientation:  Full (Time, Place, and Person)  Thought Content: Logical   Suicidal Thoughts:  No   Homicidal Thoughts:  No  Memory:  Immediate;   Fair Recent;   Fair Remote;   Fair  Judgement:  Fair  Insight:  Fair  Psychomotor Activity:  Normal  Concentration:  Concentration: Fair and Attention Span: Fair  Recall:  [employer] of Knowledge: Fair  Language: Fair  Akathisia:  No  Handed:  Right  AIMS (if indicated): done  Assets:  Communication Skills Desire for Improvement Housing Intimacy  Social Support Talents/Skills  ADL's:  Intact  Cognition: WNL  Sleep:  Poor   Screenings: GAD-7    Flowsheet Row Office Visit from 11/30/2020 in [facility] Counselor from 06/14/2020 in [facility] [place]  Total GAD-7 Score 1 9      PHQ2-9    Flowsheet Row Office Visit from 11/30/2020 in [facility] Office Visit from 08/17/2020 in [facility] Video Visit from 07/14/2020 in [facility] Counselor from 06/14/2020 in [facility]PAT[facility] [place] Office Visit from 02/03/2019 in [facility]  PHQ-2 Total Score 0 2 5 2 3   PHQ-9 Total Score 3 4 17 7 7       Flowsheet Row Office Visit from 11/30/2020 in [facility] Office Visit from 08/17/2020 in [facility] Video Visit from 07/14/2020 in [facility]  C-SSRS RISK CATEGORY Low Risk No Risk No Risk        Assessment and Plan: Gerrard Crystal is a 65 year old male to male transgender who has a history of depression, anxiety, insomnia was evaluated in office today.  Patient with insomnia, racing thoughts will benefit from the following medication readjustment.  Plan Depression in remission Viibryd 20 mg p.o. daily Seroquel as prescribed  Anxiety disorder-unstable Restart Seroquel as scheduled-Seroquel 25 mg p.o. nightly Continue CBT with Mr. [name] Hydroxyzine 25 mg  p.o. 3 times daily as needed for anxiety attacks  Insomnia-unstable We will start Seroquel 25 mg p.o. nightly Provided medication education.  At risk for long QT syndrome-we will order EKG.  Provided [phone] to contact to schedule an appointment  High risk medication use-will order TSH, prolactin-provided lab slip.  Elevated blood pressure reading-patient with elevated blood pressure reading in office today.  Repeat blood pressure also elevated.  Patient advised to contact primary care provider for management.  We will coordinate care with primary care provider.  Follow-up in clinic in 1 month or sooner.  This note was generated in part or whole with voice recognition software. Voice recognition is usually quite accurate but there are transcription errors that can and very often do occur. I apologize for any typographical errors that were not detected and corrected.       [name] Mayo, MD 12/01/2020, 8:20 AM

## 2020-11-30 NOTE — Patient Instructions (Signed)
Call this phone number at [phone]

## 2020-12-22 ENCOUNTER — Ambulatory Visit (INDEPENDENT_AMBULATORY_CARE_PROVIDER_SITE_OTHER): Payer: Medicare HMO | Admitting: Licensed Clinical Social Worker

## 2020-12-22 ENCOUNTER — Other Ambulatory Visit: Payer: Self-pay

## 2020-12-22 DIAGNOSIS — F331 Major depressive disorder, recurrent, moderate: Secondary | ICD-10-CM

## 2020-12-22 DIAGNOSIS — F640 Transsexualism: Secondary | ICD-10-CM | POA: Diagnosis not present

## 2020-12-22 DIAGNOSIS — F4010 Social phobia, unspecified: Secondary | ICD-10-CM | POA: Diagnosis not present

## 2020-12-22 NOTE — Progress Notes (Signed)
Virtual Visit via Telephone Note   I connected with Kyle Hood on 12/22/20 at 10:00am by telephone and verified that I am speaking with the correct person using two identifiers.   I discussed the limitations, risks, security and privacy concerns of performing an evaluation and management service by telephone and the availability of in person appointments. I also discussed with the patient that there may be a patient responsible charge related to this service. The patient expressed understanding and agreed to proceed.   I discussed the assessment and treatment plan with the patient. The patient was provided an opportunity to ask questions and all were answered. The patient agreed with the plan and demonstrated an understanding of the instructions.   The patient was advised to call back or seek an in-person evaluation if the symptoms worsen or if the condition fails to improve as anticipated.   I provided 30 minutes of non-face-to-face time during this encounter.     Noralee Stain, LCSW, LCAS _________________________ THERAPIST PROGRESS NOTE   Session Time: 10:00am - 10:30am   Location: Patient: Patient Home Provider: [facility]   Participation Level: Active   Behavioral Response: Alert, irritable mood   Type of Therapy:  Individual Therapy   Treatment Goals addressed: Medication compliance; Anxiety and depression management; Exercise/diet    Interventions: CBT, treatment planning    Summary: Kyle Hood is a 65 year old Caucasian transgender male to male that presented for therapy session today with diagnoses of Social anxiety disorder; Major depressive disorder, recurrent, moderate; and Gender dysphoria.     Suicidal/Homicidal: None; without plan or intent.   Therapist Response: Clinician contacted Kyle Hood by phone today due to ongoing inability for her to access video-enabled sessions or travel to meet in person.  Clinician assessed for safety, medication compliance, and  sobriety.  Kyle Hood answered this phone call on time and spoke in a manner that was alert, oriented Kyle Hood, with no evidence or self-report of active SI/HI or A/V H.  Kyle Hood that Kyle continues taking medication as prescribed and denied use of alcohol or illicit substances.  Clinician inquired about Kyle Hood's current emotional ratings, as well as any significant changes in thoughts, feelings, or behavior since previous check-in.  Kyle Hood scores of 4/10 for depression, 4/10 for anxiety, and 5/10 for irritability.  Kyle Hood that Kyle has not had any panic attacks since last check-in.  Kyle Hood Hood that Kyle has not been going out of the home very often since this exposes her to less triggers, but has been working on [employer], organizing/cleaning around the home, and watching [employer] with her partner for self-care.  Kyle also Hood that Kyle has been watching her diet more closely due to risk of diabetes noted by her doctor.  Theo denied having an agenda in mind for today's session, stating "Things have been going okay I guess".  Clinician suggested updating treatment plan due to length of time that has passed since revisions were made.  Michel was agreeable to this, so clinician collaborated with Kyle Hood to make revision to treatment plan goals as follows with her verbal consent: Meet with clinician virtually once per month for therapy to address progress towards goals and barriers to success that need to be addressed; Meet with psychiatrist once every 3 months to address efficacy of medication and make adjustments as needed to regimen and/or dosage; Take medications daily as prescribed to reduce symptoms and improve overall daily functioning; Reduce depression from average severity of 7/10 down to 5/10 in the  next 90 days by engaging in 2-3 hours per day of positive self care activities such as painting, arranging dolls collection, flower arrangements, reading, and/or watching movies; Reduce anxiety from average  severity of 4/10 down to 2/10 and maintain panic attacks at average of 0 per month over next 90 days by exploring and utilizing relaxation and/or grounding techniques such as deep breathing, meditation, 5-4-3-2-1, and/or progressive muscle relaxation; Exercise once per day by walking around neighborhood for at least 15 minutes each time, in addition to following prediabetic diet ordered by PCP each week in order to improve both physical and mental wellbeing; Work closely with therapist to improve communication skills in order to resolve conflict in social situations, particularly with triggering interactions involving neighbors or fellow shoppers in stores.  Progress is evidenced by Kyle Hood reporting reduction in depression from average of 7 down to 4 over recent months, panic attacks down to 0 per month, maintaining daily self-care routine, consistent attendance to therapy and psychiatry appointments, medication compliance, and consistent exercise.  Kyle Hood Hood that Kyle would like to continue working to reduce anxiety and improve diet due to risk of diabetes.  Clinician will continue to monitor.     Plan: Follow up again in 1 month.    Diagnosis: Social Anxiety Disorder; Major Depressive Disorder, recurrent, moderate; and Gender Dysphoria   Noralee Stain, [address], LCAS 12/22/20

## 2021-01-25 ENCOUNTER — Ambulatory Visit (INDEPENDENT_AMBULATORY_CARE_PROVIDER_SITE_OTHER): Payer: Medicare HMO | Admitting: Licensed Clinical Social Worker

## 2021-01-25 ENCOUNTER — Other Ambulatory Visit: Payer: Self-pay

## 2021-01-25 DIAGNOSIS — F4010 Social phobia, unspecified: Secondary | ICD-10-CM

## 2021-01-25 DIAGNOSIS — F331 Major depressive disorder, recurrent, moderate: Secondary | ICD-10-CM | POA: Diagnosis not present

## 2021-01-25 DIAGNOSIS — F640 Transsexualism: Secondary | ICD-10-CM

## 2021-01-25 NOTE — Progress Notes (Signed)
Virtual Visit via Telephone Note   I connected with Kyle Hood on 01/25/21 at 2:00pm by telephone and verified that I am speaking with the correct person using two identifiers.   I discussed the limitations, risks, security and privacy concerns of performing an evaluation and management service by telephone and the availability of in person appointments. I also discussed with the patient that there may be a patient responsible charge related to this service. The patient expressed understanding and agreed to proceed.   I discussed the assessment and treatment plan with the patient. The patient was provided an opportunity to ask questions and all were answered. The patient agreed with the plan and demonstrated an understanding of the instructions.   The patient was advised to call back or seek an in-person evaluation if the symptoms worsen or if the condition fails to improve as anticipated.   I provided 45 minutes of non-face-to-face time during this encounter.     Noralee Stain, LCSW, LCAS _________________________ THERAPIST PROGRESS NOTE   Session Time: 2:00pm - 2:45pm   Location: Patient: Patient Home Provider: [facility]   Participation Level: Active   Behavioral Response: Alert, anxious mood   Type of Therapy:  Individual Therapy   Treatment Goals addressed: Medication compliance; Anxiety and depression management    Interventions: CBT: challenging anxious thoughts    Summary: Kyle Hood is a 65 year old Caucasian transgender male to male that presented for therapy session today with diagnoses of Social anxiety disorder; Major depressive disorder, recurrent, moderate; and Gender dysphoria.     Suicidal/Homicidal: None; without plan or intent.   Therapist Response: Clinician contacted Kyle Hood by phone today due to ongoing inability for her to access video-enabled appointments or travel to meet in person.  Clinician assessed for safety, medication compliance, and sobriety.   Kyle Hood answered this phone call on time and spoke in a manner that was alert, oriented x5, with no evidence or self-report of active SI/HI or A/V H.  Kyle Hood reported ongoing compliance with medication and denied use of alcohol or illicit substances.  Clinician inquired about Kyle Hood's emotional ratings today, as well as any significant changes in thoughts, feelings, or behavior since last check-in.  Kyle Hood reported scores of 4/10 for depression, 5/10 for anxiety, and 5/10 for anger/irritability.  Kyle Hood reported that Kyle Hood has not had any panic attacks or outbursts since previous check-in, but has been increasingly anxious about the future.  Clinician utilized handout in session today titled "Worry exploration" in order to assist Kyle Hood in reducing her anxiety related to the future.  This worksheet featured a series of Socratic questions aimed at exploring the most likely outcomes for a situation of concern, rather than focusing on the worst possible outcome (i.e. catastrophizing).  Clinician assisted Latrel in identifying and challenging any irrational beliefs related to this worry, in addition to utilizing problem solving approach to explore strategies which would help her shift focus from identifiable stressors towards more self-care activities.  Kyle Hood actively participated in discussion on handout, reporting that Kyle Hood is worried about the economy worsening and world powers going to war due to political unrest Kyle Hood hears about in the media.  Kyle Hood reported that there is sufficient evidence to suggest things could get worse, but not necessarily to the point where bombs would be dropped, stating "The powers that be wouldn't allow it".  Kyle Hood was also able to acknowledge that Kyle Hood has been able to cope through difficult political times in the past despite holding a similar outlook.  Intervention  was effective, as evidenced by Kyle Hood reporting that as a result of this activity, Kyle Hood will maintain a rigid social media boundary for the time  being due to the negative impact political news tends to have upon her mental health. Kyle Hood reported that Kyle Hood would replace this time with more focus on health distractions instead such as painting, stating "Painting is a productive, positive escape for me.  I'm glad I have something to plug into each day".  Clinician will continue to monitor.     Plan: Follow up again in 1 month.    Diagnosis: Social Anxiety Disorder; Major Depressive Disorder, recurrent, moderate; and Gender Dysphoria   Noralee Stain, [address], LCAS 01/25/21

## 2021-02-04 ENCOUNTER — Emergency Department: Payer: Medicare HMO

## 2021-02-04 ENCOUNTER — Other Ambulatory Visit: Payer: Self-pay

## 2021-02-04 ENCOUNTER — Emergency Department
Admission: EM | Admit: 2021-02-04 | Discharge: 2021-02-04 | Disposition: A | Discharge status: Home / Self Care | Payer: Medicare HMO | Attending: Emergency Medicine | Admitting: Emergency Medicine

## 2021-02-04 DIAGNOSIS — I10 Essential (primary) hypertension: Secondary | ICD-10-CM | POA: Diagnosis not present

## 2021-02-04 DIAGNOSIS — R0789 Other chest pain: Secondary | ICD-10-CM | POA: Diagnosis not present

## 2021-02-04 DIAGNOSIS — Z79899 Other long term (current) drug therapy: Secondary | ICD-10-CM | POA: Diagnosis not present

## 2021-02-04 LAB — CBC
HCT: 41.6 % (ref 39.0–52.0)
Hemoglobin: 15 g/dL (ref 13.0–17.0)
MCH: 34.3 pg — ABNORMAL HIGH (ref 26.0–34.0)
MCHC: 36.1 g/dL — ABNORMAL HIGH (ref 30.0–36.0)
MCV: 95.2 fL (ref 80.0–100.0)
Platelets: 272 10*3/uL (ref 150–400)
RBC: 4.37 MIL/uL (ref 4.22–5.81)
RDW: 12.3 % (ref 11.5–15.5)
WBC: 9.3 10*3/uL (ref 4.0–10.5)
nRBC: 0 % (ref 0.0–0.2)

## 2021-02-04 LAB — TROPONIN I (HIGH SENSITIVITY): Troponin I (High Sensitivity): 5 ng/L (ref <18)

## 2021-02-04 LAB — BASIC METABOLIC PANEL
Anion gap: 6 (ref 5–15)
BUN: 17 mg/dL (ref 8–23)
CO2: 27 mmol/L (ref 22–32)
Calcium: 9.1 mg/dL (ref 8.9–10.3)
Chloride: 104 mmol/L (ref 98–111)
Creatinine, Ser: 1.01 mg/dL (ref 0.61–1.24)
GFR, Estimated: >60 mL/min (ref >60)
Glucose, Bld: 150 mg/dL — ABNORMAL HIGH (ref 70–99)
Potassium: 4 mmol/L (ref 3.5–5.1)
Sodium: 137 mmol/L (ref 135–145)

## 2021-02-04 NOTE — ED Provider Notes (Signed)
[facility] Emergency Department Provider Note   ____________________________________________    I have reviewed the triage vital signs and the nursing notes.   HISTORY  Chief Complaint Chest Pain     HPI Kyle Hood is a 65 y.o. adult with a history as detailed below who presents with complaints of chest pain.  Patient describes 3 episodes of very brief sharp stabbing sensation in epigastrium earlier today while gesturing emphatically with arms and then again while watching TV.  Currently feels quite well and has no complaints.  No shortness of breath, no pleurisy.  No fevers or chills or cough.  No calf pain or swelling.  Recently started metformin.  No back pain  Past Medical History:  Diagnosis Date   Abnormal liver function test    Anxiety    Anxiety    Arthritis    Chronic lower back pain    Degenerative joint disease    Depression    Fatty liver    Heart murmur    Hepatitis B    Hyperlipidemia    Hypertension    Lumbar polyradiculopathy    Panic attacks    Polyneuropathy    Vertigo 05/03/2017   ER    Patient Active Problem List   Diagnosis Date Noted   Insomnia 11/30/2020   High risk medication use 11/30/2020   At risk for prolonged QT interval syndrome 11/30/2020   Severe obesity (BMI 35.0-39.9) with comorbidity (HCC) 03/16/2020   MDD (major depressive disorder), recurrent, in full remission (HCC) 08/06/2019   Prediabetes 06/03/2019   Major depressive disorder, recurrent, moderate (HCC) 05/28/2019   Lymphedema 05/20/2019   Chronic venous insufficiency 05/20/2019   Abnormal fasting glucose 03/18/2019   No-show for appointment 01/26/2019   MDD (major depressive disorder), recurrent, in partial remission (HCC) 12/01/2018   Social anxiety disorder 12/01/2018   Panic disorder 12/01/2018   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 12/01/2018   Lumbar spondylosis 04/02/2018   Lumbar degenerative disc disease 04/02/2018   Lumbar facet  arthropathy 04/02/2018   Strain of lumbar region 04/02/2018   Anxiety, generalized 02/06/2017   Chronic bilateral low back pain 12/18/2016   Lumbar polyradiculopathy 12/18/2016   Polyneuropathy 12/18/2016   Pain in limb 07/03/2016   DJD (degenerative joint disease) 07/03/2016   Cellulitis 06/18/2016   Elevated blood pressure reading in office with diagnosis of hypertension 10/26/2014   Lower extremity edema 10/26/2014   Hyperlipidemia 10/26/2014   Transient loss of consciousness 09/08/2014    Past Surgical History:  Procedure Laterality Date   COLONOSCOPY     COLONOSCOPY WITH PROPOFOL N/A 10/01/2017   Procedure: COLONOSCOPY WITH PROPOFOL;  Surgeon: Toledo, Boykin Nearing, MD;  Location: [facility] ENDOSCOPY;  Service: Endoscopy;  Laterality: N/A;   PROSTATE BIOPSY      Prior to Admission medications   Medication Sig Start Date End Date Taking? Authorizing Provider  hydrochlorothiazide (HYDRODIURIL) 25 MG tablet Take 25 mg by mouth daily.    [provider]  hydrOXYzine (ATARAX/VISTARIL) 25 MG tablet Take 0.5-1 tablets (12.5-25 mg total) by mouth 3 (three) times daily as needed. For severe anxiety 08/17/20   Jomarie Longs, MD  naproxen (NAPROSYN) 500 MG tablet TK 1 T PO BID WC 02/24/18   [provider]  pravastatin (PRAVACHOL) 10 MG tablet Take 10 mg by mouth daily.    [provider]  QUEtiapine (SEROQUEL) 25 MG tablet TAKE 1 TABLET(25 MG) BY MOUTH AT BEDTIME FOR MOOD OR SLEEP 08/17/20   Jomarie Longs, MD  valsartan (DIOVAN) 40 MG tablet Take 40 mg by mouth daily. 03/18/19   [provider]  Vilazodone HCl (VIIBRYD) 20 MG TABS TAKE 1 TABLET(20 MG) BY MOUTH DAILY 08/17/20   Jomarie Longs, MD     Allergies Lisinopril  Family History  Problem Relation Age of Onset   Cancer Mother        Pancreatic   Diabetes Mother    Hypertension Mother    Varicose Veins Mother    Alcohol abuse Mother    Heart disease Father        CABG   Cancer Father         Jaw   Aortic aneurysm Father    Congestive Heart Failure Father    Hypertension Father    Varicose Veins Father    Alcohol abuse Father    Bipolar disorder Sister    Bipolar disorder Brother     Social History Social History   Tobacco Use   Smoking status: Never   Smokeless tobacco: Never  Vaping Use   Vaping Use: Never used  Substance Use Topics   Alcohol use: No   Drug use: Yes    Types: Marijuana    Review of Systems  Constitutional: No fever/chills Eyes: No visual changes.  ENT: No sore throat. Cardiovascular: As above Respiratory: Denies shortness of breath. Gastrointestinal: No abdominal pain.  No nausea, no vomiting.   Genitourinary: Negative for dysuria. Musculoskeletal: Negative for back pain. Skin: Negative for rash. Neurological: Negative for headaches or weakness   ____________________________________________   PHYSICAL EXAM:  VITAL SIGNS: ED Triage Vitals  Enc Vitals Group     BP 02/04/21 1419 (!) 166/78     Pulse Rate 02/04/21 1419 82     Resp 02/04/21 1419 18     Temp 02/04/21 1419 97.6 F (36.4 C)     Temp Source 02/04/21 1419 Oral     SpO2 02/04/21 1419 96 %     Weight 02/04/21 1417 113.4 kg (250 lb)     Height 02/04/21 1417 1.829 m (6')     Head Circumference --      Peak Flow --      Pain Score 02/04/21 1416 7     Pain Loc --      Pain Edu? --      Excl. in GC? --     Constitutional: Alert and oriented. No acute distress. Pleasant and interactive Eyes: Conjunctivae are normal.  Head: Atraumatic. Nose: No congestion/rhinnorhea.  Cardiovascular: Normal rate, regular rhythm.  Systolic ejection murmur, 2 out of 6, patient reports his PCP knows about this, good peripheral circulation. Respiratory: Normal respiratory effort.  No retractions. Lungs CTAB. Gastrointestinal: Soft and nontender. No distention.   Genitourinary: deferred Musculoskeletal: No lower extremity tenderness nor edema.  Warm and well perfused Neurologic:  Normal  speech and language. No gross focal neurologic deficits are appreciated.  Skin:  Skin is warm, dry and intact. No rash noted. Psychiatric: Mood and affect are normal. Speech and behavior are normal.  ____________________________________________   LABS (all labs ordered are listed, but only abnormal results are displayed)  Labs Reviewed  BASIC METABOLIC PANEL - Abnormal; Notable for the following components:      Result Value   Glucose, Bld 150 (*)    All other components within normal limits  CBC - Abnormal; Notable for the following components:   MCH 34.3 (*)    MCHC 36.1 (*)    All other components within normal limits  TROPONIN I (HIGH SENSITIVITY)   ____________________________________________  EKG  ED ECG REPORT I, Jene Every, the attending physician, personally viewed and interpreted this ECG.  Date: 02/04/2021  Rhythm: normal sinus rhythm QRS Axis: normal Intervals: normal ST/T Wave abnormalities: normal Narrative Interpretation: no evidence of acute ischemia  ____________________________________________  RADIOLOGY  Chest x-ray reviewed by me, no acute abnormality ____________________________________________   PROCEDURES  Procedure(s) performed: No  Procedures   Critical Care performed: No ____________________________________________   INITIAL IMPRESSION / ASSESSMENT AND PLAN / ED COURSE  Pertinent labs & imaging results that were available during my care of the patient were reviewed by me and considered in my medical decision making (see chart for details).   Patient well-appearing and in no acute distress, overall well-appearing here and asymptomatic.  Symptoms occurred several hours ago, EKG is quite reassuring and HPI is not consistent with ACS.  High sensitive troponin is normal  Not consistent with PE or dissection.  Will refer the patient for outpatient follow-up with cardiology, strict return precautions discussed.  Patient agrees with  this plan    ____________________________________________   FINAL CLINICAL IMPRESSION(S) / ED DIAGNOSES  Final diagnoses:  Atypical chest pain        Note:  This document was prepared using Dragon voice recognition software and may include unintentional dictation errors.    Jene Every, MD 02/04/21 [id number]

## 2021-02-04 NOTE — ED Triage Notes (Signed)
Pt via POV from home. Pt c/o L sided CP that started 1130 this AM. Pt also states that he has been having lightheadedness. Denies SOB, cough, nausea. Pt just started flucinonide last night. Pt is A&Ox4 and NAD.

## 2021-02-15 ENCOUNTER — Ambulatory Visit (INDEPENDENT_AMBULATORY_CARE_PROVIDER_SITE_OTHER): Payer: Medicare HMO | Admitting: Psychiatry

## 2021-02-15 ENCOUNTER — Other Ambulatory Visit: Payer: Self-pay

## 2021-02-15 ENCOUNTER — Encounter: Payer: Self-pay | Admitting: Psychiatry

## 2021-02-15 VITALS — BP 152/94 | HR 87 | Temp 98.5°F | Wt 278.0 lb

## 2021-02-15 DIAGNOSIS — F5101 Primary insomnia: Secondary | ICD-10-CM

## 2021-02-15 DIAGNOSIS — F4010 Social phobia, unspecified: Secondary | ICD-10-CM | POA: Diagnosis not present

## 2021-02-15 DIAGNOSIS — F3342 Major depressive disorder, recurrent, in full remission: Secondary | ICD-10-CM

## 2021-02-15 DIAGNOSIS — F410 Panic disorder [episodic paroxysmal anxiety] without agoraphobia: Secondary | ICD-10-CM | POA: Diagnosis not present

## 2021-02-15 DIAGNOSIS — I10 Essential (primary) hypertension: Secondary | ICD-10-CM

## 2021-02-15 MED ORDER — VILAZODONE HCL 20 MG PO TABS: ORAL_TABLET | ORAL | 1 refills | Status: DC

## 2021-02-15 NOTE — Progress Notes (Signed)
[facility] MD OP Progress Note  02/15/2021 12:07 PM Kyle Hood  MRN:  [id number]  Chief Complaint:  Chief Complaint   Follow-up; Depression    HPI: Kyle Hood is a 65 year old transgender male to male, lives in [place], has a history of MDD, panic disorder, social anxiety disorder was evaluated in office today.  Patient today reports overall they are doing well.  Reports currently does not have situational stressors of relationship problems with neighbors since they have new neighbors right now.  Denies any sadness, crying spells.  Reports anxiety is stable.  Reports sleep is overall okay, takes Seroquel only as needed  Continues to have good social support system from partner.  Reports they have started painting and that has been therapeutic.  Denies any other concerns today.  Visit Diagnosis:    ICD-10-CM   1. MDD (major depressive disorder), recurrent, in full remission (HCC)  F33.42     2. Social anxiety disorder  F40.10 Vilazodone HCl (VIIBRYD) 20 MG TABS    3. Panic disorder  F41.0     4. Primary insomnia  F51.01     5. Elevated blood pressure reading in office with diagnosis of hypertension  I10       Past Psychiatric History: Reviewed past psychiatric history from progress note on 06/05/2017.  Past trials of Zoloft, Wellbutrin  Past Medical History:  Past Medical History:  Diagnosis Date   Abnormal liver function test    Anxiety    Anxiety    Arthritis    Chronic lower back pain    Degenerative joint disease    Depression    Fatty liver    Heart murmur    Hepatitis B    Hyperlipidemia    Hypertension    Lumbar polyradiculopathy    Panic attacks    Polyneuropathy    Vertigo 05/03/2017   ER    Past Surgical History:  Procedure Laterality Date   COLONOSCOPY     COLONOSCOPY WITH PROPOFOL N/A 10/01/2017   Procedure: COLONOSCOPY WITH PROPOFOL;  Surgeon: Toledo, Boykin Nearing, MD;  Location: [facility] ENDOSCOPY;  Service: Endoscopy;  Laterality: N/A;    PROSTATE BIOPSY      Family Psychiatric History: Reviewed family psychiatric history from progress note on 06/05/2017  Family History:  Family History  Problem Relation Age of Onset   Cancer Mother        Pancreatic   Diabetes Mother    Hypertension Mother    Varicose Veins Mother    Alcohol abuse Mother    Heart disease Father        CABG   Cancer Father        Jaw   Aortic aneurysm Father    Congestive Heart Failure Father    Hypertension Father    Varicose Veins Father    Alcohol abuse Father    Bipolar disorder Sister    Bipolar disorder Brother     Social History: Reviewed social history from progress note on 06/05/2017 Social History   Socioeconomic History   Marital status: Single    Spouse name: denise   Number of children: 0   Years of education: Not on file   Highest education level: 11th grade  Occupational History    Comment: disabled  Tobacco Use   Smoking status: Never   Smokeless tobacco: Never  Vaping Use   Vaping Use: Never used  Substance and Sexual Activity   Alcohol use: No   Drug use: Yes    Types: Marijuana  Sexual activity: Not Currently  Other Topics Concern   Not on file  Social History Narrative   Not on file   Social Determinants of Health   Financial Resource Strain: Not on file  Food Insecurity: Not on file  Transportation Needs: Not on file  Physical Activity: Not on file  Stress: Not on file  Social Connections: Not on file    Allergies:  Allergies  Allergen Reactions   Lisinopril Cough    Metabolic Disorder Labs: Lab Results  Component Value Date   HGBA1C 6.1 08/16/2014   No results found for: PROLACTIN Lab Results  Component Value Date   CHOL 145 08/16/2014   TRIG 320 (A) 08/16/2014   HDL 36 08/16/2014   LDLCALC 45 08/16/2014   Lab Results  Component Value Date   TSH 1.61 08/16/2014    Therapeutic Level Labs: No results found for: LITHIUM No results found for: VALPROATE No components found for:   CBMZ  Current Medications: Current Outpatient Medications  Medication Sig Dispense Refill   fluocinonide ointment (LIDEX) 0.05 % Apply topically 2 (two) times daily.     hydrochlorothiazide (HYDRODIURIL) 25 MG tablet Take 25 mg by mouth daily.     hydrOXYzine (ATARAX/VISTARIL) 25 MG tablet Take 0.5-1 tablets (12.5-25 mg total) by mouth 3 (three) times daily as needed. For severe anxiety 90 tablet 1   metFORMIN (GLUCOPHAGE-XR) 500 MG 24 hr tablet Take by mouth.     naproxen (NAPROSYN) 500 MG tablet TK 1 T PO BID WC  3   pravastatin (PRAVACHOL) 20 MG tablet Take 20 mg by mouth daily.     QUEtiapine (SEROQUEL) 25 MG tablet TAKE 1 TABLET(25 MG) BY MOUTH AT BEDTIME FOR MOOD OR SLEEP 90 tablet 1   valsartan (DIOVAN) 80 MG tablet Take by mouth.     Vilazodone HCl (VIIBRYD) 20 MG TABS TAKE 1 TABLET(20 MG) BY MOUTH DAILY 90 tablet 1   No current facility-administered medications for this visit.     Musculoskeletal: Strength & Muscle Tone: within normal limits Gait & Station: normal Patient leans: N/A  Psychiatric Specialty Exam: Review of Systems  Psychiatric/Behavioral:  Negative for agitation, behavioral problems, confusion, decreased concentration, dysphoric mood, hallucinations, self-injury, sleep disturbance and suicidal ideas. The patient is not nervous/anxious and is not hyperactive.   All other systems reviewed and are negative.  Blood pressure (!) 152/94, pulse 87, temperature 98.5 F (36.9 C), temperature source Temporal, weight 278 lb (126.1 kg).Body mass index is 37.7 kg/m.  General Appearance: Casual  Eye Contact:  Fair  Speech:  Clear and Coherent  Volume:  Normal  Mood:  Euthymic  Affect:  Congruent  Thought Process:  Goal Directed and Descriptions of Associations: Intact  Orientation:  Full (Time, Place, and Person)  Thought Content: Logical   Suicidal Thoughts:  No  Homicidal Thoughts:  No  Memory:  Immediate;   Fair Recent;   Fair Remote;   Fair  Judgement:  Fair   Insight:  Fair  Psychomotor Activity:  Normal  Concentration:  Concentration: Fair and Attention Span: Fair  Recall:  [employer] of Knowledge: Fair  Language: Fair  Akathisia:  No  Handed:  Right  AIMS (if indicated): done  Assets:  Communication Skills Desire for Improvement Housing Social Support  ADL's:  Intact  Cognition: WNL  Sleep:  Fair   Screenings: GAD-7    Garment/textile technologist Visit from 02/15/2021 in [facility] Office Visit from 11/30/2020 in [facility]  Counselor from 06/14/2020 in [facility] [place]  Total GAD-7 Score 1 1 9       PHQ2-9    Flowsheet Row Office Visit from 02/15/2021 in [facility] Office Visit from 11/30/2020 in [facility] Office Visit from 08/17/2020 in [facility] Video Visit from 07/14/2020 in [facility] Counselor from 06/14/2020 in BE[facility]AVIORAL HEALTH OUTPATIENT THERAPY [place]  PHQ-2 Total Score 0 0 2 5 2   PHQ-9 Total Score -- 3 4 17 7       Flowsheet Row Office Visit from 02/15/2021 in [facility] ED from 02/04/2021 in [facility] Office Visit from 11/30/2020 in [facility]  C-SSRS RISK CATEGORY Low Risk No Risk Low Risk        Assessment and Plan: Kyle Hood is a 65 year old male to male transgender who has a history of depression, anxiety, insomnia was evaluated in office today.  Patient is currently stable on the current medication regimen.  Plan as noted below.  Plan Depression in remission Viibryd 20 mg p.o. daily Seroquel 25 mg p.o. nightly.  Anxiety disorder-social/panic disorder-stable Seroquel 25 mg p.o. nightly Hydroxyzine 25 mg p.o. 3 times daily as needed Patient is not interested in psychotherapy sessions at this time,  advised to have CBT as needed  Insomnia-stable Seroquel 25 mg p.o. nightly, currently uses it as needed  Elevated blood pressure reading-patient to follow up with primary care provider for elevated blood pressure reading in session today.  Reviewed labs-TSH-dated 12/07/2020-within normal limits.  Follow-up in clinic in 3 months or sooner in office.  This note was generated in part or whole with voice recognition software. Voice recognition is usually quite accurate but there are transcription errors that can and very often do occur. I apologize for any typographical errors that were not detected and corrected.    [name], MD 02/16/2021, 8:34 AM

## 2021-02-19 ENCOUNTER — Encounter (HOSPITAL_COMMUNITY): Payer: Self-pay | Admitting: Licensed Clinical Social Worker

## 2021-03-03 ENCOUNTER — Other Ambulatory Visit: Payer: Self-pay | Admitting: Psychiatry

## 2021-03-03 DIAGNOSIS — F410 Panic disorder [episodic paroxysmal anxiety] without agoraphobia: Secondary | ICD-10-CM

## 2021-03-03 DIAGNOSIS — F3342 Major depressive disorder, recurrent, in full remission: Secondary | ICD-10-CM

## 2021-03-08 DIAGNOSIS — L600 Ingrowing nail: Secondary | ICD-10-CM | POA: Insufficient documentation

## 2021-05-17 ENCOUNTER — Other Ambulatory Visit: Payer: Self-pay

## 2021-05-17 ENCOUNTER — Ambulatory Visit (INDEPENDENT_AMBULATORY_CARE_PROVIDER_SITE_OTHER): Payer: Medicare HMO | Admitting: Psychiatry

## 2021-05-17 ENCOUNTER — Encounter: Payer: Self-pay | Admitting: Psychiatry

## 2021-05-17 VITALS — BP 150/84 | HR 83 | Temp 98.1°F | Wt 278.0 lb

## 2021-05-17 DIAGNOSIS — F3342 Major depressive disorder, recurrent, in full remission: Secondary | ICD-10-CM | POA: Diagnosis not present

## 2021-05-17 DIAGNOSIS — F5101 Primary insomnia: Secondary | ICD-10-CM | POA: Diagnosis not present

## 2021-05-17 DIAGNOSIS — F410 Panic disorder [episodic paroxysmal anxiety] without agoraphobia: Secondary | ICD-10-CM | POA: Diagnosis not present

## 2021-05-17 DIAGNOSIS — F4010 Social phobia, unspecified: Secondary | ICD-10-CM

## 2021-05-17 NOTE — Progress Notes (Signed)
[facility] OP Progress Note  05/17/2021 11:35 AM Kyle Hood  MRN:  [id number]  Chief Complaint:  Chief Complaint   Follow-up;  66 year old transgender male to male, with history of MDD, panic disorder, social anxiety disorder presents for follow-up for medication management.    HPI: Kyle Hood is a 66 year old with history of MDD, panic disorder, social anxiety disorder, transgender male to male, lives in [place], was evaluated in office today.  Patient today reports they are currently doing well on the current medication regimen.  Does have some anxiety on and off about their partner.  Reports partner is going to retire soon and that makes them feel better.  Denies any significant depressive symptoms.  Reports sleep and appetite is fair.  Denies suicidality, homicidality or perceptual disturbances.  Reports compliant on the Viibryd.  Takes the Seroquel only as needed.  Reports had recent visit with primary care provider.  Reviewed notes per Dr.Behling dated 05/10/2021-increase valsartan to 80 mg twice a day and is on hydrochlorothiazide 25 mg daily for hypertension.'  Patient today reports being compliant on the medication as prescribed.  Reports stays busy doing art work, painting, taking care of household chores.  Denies any other concerns today.  Visit Diagnosis:    ICD-10-CM   1. MDD (major depressive disorder), recurrent, in full remission ([facility])  F33.42     2. Social anxiety disorder  F40.10     3. Panic disorder  F41.0     4. Primary insomnia  F51.01       Past Psychiatric History: Reviewed past psychiatric history from progress note on 06/05/2017.  Past trials of Zoloft, Wellbutrin.  Past Medical History:  Past Medical History:  Diagnosis Date   Abnormal liver function test    Anxiety    Anxiety    Arthritis    Chronic lower back pain    Degenerative joint disease    Depression    Fatty liver    Heart murmur    Hepatitis B    Hyperlipidemia     Hypertension    Lumbar polyradiculopathy    Panic attacks    Polyneuropathy    Vertigo 05/03/2017   ER    Past Surgical History:  Procedure Laterality Date   COLONOSCOPY     COLONOSCOPY WITH PROPOFOL N/A 10/01/2017   Procedure: COLONOSCOPY WITH PROPOFOL;  Surgeon: Toledo, Benay Pike, MD;  Location: [facility] ENDOSCOPY;  Service: Endoscopy;  Laterality: N/A;   PROSTATE BIOPSY      Family Psychiatric History: Reviewed family psychiatric history from progress note on 06/05/2017.  Family History:  Family History  Problem Relation Age of Onset   Cancer Mother        Pancreatic   Diabetes Mother    Hypertension Mother    Varicose Veins Mother    Alcohol abuse Mother    Heart disease Father        CABG   Cancer Father        Jaw   Aortic aneurysm Father    Congestive Heart Failure Father    Hypertension Father    Varicose Veins Father    Alcohol abuse Father    Bipolar disorder Sister    Bipolar disorder Brother     Social History: Reviewed social history from progress note on 06/05/2017. Social History   Socioeconomic History   Marital status: Single    Spouse name: denise   Number of children: 0   Years of education: Not on file   Highest education  level: 11th grade  Occupational History    Comment: disabled  Tobacco Use   Smoking status: Never   Smokeless tobacco: Never  Vaping Use   Vaping Use: Never used  Substance and Sexual Activity   Alcohol use: No   Drug use: Yes    Types: Marijuana   Sexual activity: Not Currently  Other Topics Concern   Not on file  Social History Narrative   Not on file   Social Determinants of Health   Financial Resource Strain: Not on file  Food Insecurity: Not on file  Transportation Needs: Not on file  Physical Activity: Not on file  Stress: Not on file  Social Connections: Not on file    Allergies:  Allergies  Allergen Reactions   Lisinopril Cough    Metabolic Disorder Labs: Lab Results  Component Value Date   HGBA1C  6.1 08/16/2014   No results found for: PROLACTIN Lab Results  Component Value Date   CHOL 145 08/16/2014   TRIG 320 (A) 08/16/2014   HDL 36 08/16/2014   LDLCALC 45 08/16/2014   Lab Results  Component Value Date   TSH 1.61 08/16/2014    Therapeutic Level Labs: No results found for: LITHIUM No results found for: VALPROATE No components found for:  CBMZ  Current Medications: Current Outpatient Medications  Medication Sig Dispense Refill   fluocinonide ointment (LIDEX) 0.05 % Apply topically 2 (two) times daily.     hydrochlorothiazide (HYDRODIURIL) 25 MG tablet Take 25 mg by mouth daily.     hydrOXYzine (ATARAX/VISTARIL) 25 MG tablet Take 0.5-1 tablets (12.5-25 mg total) by mouth 3 (three) times daily as needed. For severe anxiety 90 tablet 1   metFORMIN (GLUCOPHAGE-XR) 500 MG 24 hr tablet Take by mouth.     naproxen (NAPROSYN) 500 MG tablet TK 1 T PO BID WC  3   pravastatin (PRAVACHOL) 20 MG tablet Take 20 mg by mouth daily.     QUEtiapine (SEROQUEL) 25 MG tablet TAKE 1 TABLET(25 MG) BY MOUTH AT BEDTIME FOR MOOD OR SLEEP 90 tablet 1   valsartan (DIOVAN) 80 MG tablet Take by mouth.     Vilazodone HCl (VIIBRYD) 20 MG TABS TAKE 1 TABLET(20 MG) BY MOUTH DAILY 90 tablet 1   No current facility-administered medications for this visit.     Musculoskeletal: Strength & Muscle Tone: within normal limits Gait & Station: normal Patient leans: N/A  Psychiatric Specialty Exam: Review of Systems  Psychiatric/Behavioral:  The patient is nervous/anxious.   All other systems reviewed and are negative.  There were no vitals taken for this visit.There is no height or weight on file to calculate BMI.  General Appearance: Casual  Eye Contact:  Fair  Speech:  Clear and Coherent  Volume:  Normal  Mood:  Anxious, coping well  Affect:  Appropriate  Thought Process:  Goal Directed and Descriptions of Associations: Intact  Orientation:  Full (Time, Place, and Person)  Thought Content:  Logical   Suicidal Thoughts:  No  Homicidal Thoughts:  No  Memory:  Immediate;   Fair Recent;   Fair Remote;   Fair  Judgement:  Fair  Insight:  Fair  Psychomotor Activity:  Normal  Concentration:  Concentration: Fair and Attention Span: Fair  Recall:  [employer] of Knowledge: Fair  Language: Fair  Akathisia:  No  Handed:  Right  AIMS (if indicated): done, 0  Assets:  Communication Skills Desire for Improvement Housing Intimacy Social Support  ADL's:  Intact  Cognition: WNL  Sleep:  Fair   Screenings: AIMS    [place] Office Visit from 05/17/2021 in [place] Total Score 0      GAD-7    [place] Office Visit from 05/17/2021 in [place] Office Visit from 02/15/2021 in [place] Office Visit from 11/30/2020 in [place] from 06/14/2020 in [facility]  Total GAD-7 Score 4 1 1 9       PHQ2-9    [place] Visit from 05/17/2021 in [place] Office Visit from 02/15/2021 in [place] Visit from 11/30/2020 in [place] Visit from 08/17/2020 in [place] Video Visit from 07/14/2020 in [place]  PHQ-2 Total Score 0 0 0 2 5  PHQ-9 Total Score 0 -- 3 4 17       [place] Office Visit from 05/17/2021 in [place] Office Visit from 02/15/2021 in [place] ED from 02/04/2021 in [place] CATEGORY Low Risk Low Risk No Risk        Assessment and Plan: Kyle Hood is a 67 year old male to male transgender who has a history of depression, anxiety, insomnia was evaluated in office today.  Patient is currently  stable on current medication regimen.  Plan Depression in remission Viibryd 20 mg p.o. daily  Social/panic disorder-stable Seroquel 25 mg p.o. nightly Hydroxyzine 25 mg p.o. 3 times daily as needed Currently noncompliant with CBT, reports they do not needed at this time.  Insomnia-stable Seroquel 25 mg p.o. nightly as needed  For uncontrolled hypertension patient advised to continue to follow up with primary care provider.  Had recent medication changes as noted above-reviewed notes from her primary care provider-Dr.Behling as noted above in [facility].  Follow-up in clinic in 3 months or sooner in office.  This note was generated in part or whole with voice recognition software. Voice recognition is usually quite accurate but there are transcription errors that can and very often do occur. I apologize for any typographical errors that were not detected and corrected.       Ursula Alert, MD 05/17/2021, 11:35 AM

## 2021-05-30 DIAGNOSIS — R972 Elevated prostate specific antigen [PSA]: Secondary | ICD-10-CM | POA: Insufficient documentation

## 2021-06-21 ENCOUNTER — Encounter: Payer: Self-pay | Admitting: Urology

## 2021-06-21 ENCOUNTER — Other Ambulatory Visit: Payer: Self-pay

## 2021-06-21 ENCOUNTER — Ambulatory Visit (INDEPENDENT_AMBULATORY_CARE_PROVIDER_SITE_OTHER): Payer: Medicare HMO | Admitting: Urology

## 2021-06-21 VITALS — BP 132/77 | HR 91 | Ht 72.0 in | Wt 278.0 lb

## 2021-06-21 DIAGNOSIS — R972 Elevated prostate specific antigen [PSA]: Secondary | ICD-10-CM | POA: Diagnosis not present

## 2021-06-21 DIAGNOSIS — N401 Enlarged prostate with lower urinary tract symptoms: Secondary | ICD-10-CM | POA: Diagnosis not present

## 2021-06-21 DIAGNOSIS — R350 Frequency of micturition: Secondary | ICD-10-CM

## 2021-06-21 MED ORDER — TAMSULOSIN HCL 0.4 MG PO CAPS: 0.4000 mg | ORAL_CAPSULE | Freq: Every day | ORAL | 0 refills | Status: DC

## 2021-06-21 NOTE — Progress Notes (Signed)
06/21/2021 11:25 AM   Kyle Hood 1955-12-27 [id number]  Referring provider: Orene Desanctis, MD [address] [place],  [place] [address]  Chief Complaint  Patient presents with   Elevated PSA    HPI: Kyle Hood is a 66 y.o. male referred for evaluation of an elevated PSA.  PSA 05/10/2021 was 3.81; record review remarkable for PSA 1.16 in 2018 and 1.0 in 2020 Only voiding symptoms urinary frequency in the morning though he does drink a fairly significant volume of water, coffee and cranberry juice No prior history urologic problems No family history prostate cancer Denies dysuria, gross hematuria or recurrent UTI Prostate biopsy is listed on his record though denies previous prostate biopsy  PMH: Past Medical History:  Diagnosis Date   Abnormal liver function test    Anxiety    Anxiety    Arthritis    Chronic lower back pain    Degenerative joint disease    Depression    Fatty liver    Heart murmur    Hepatitis B    Hyperlipidemia    Hypertension    Lumbar polyradiculopathy    Panic attacks    Polyneuropathy    Vertigo 05/03/2017   ER    Surgical History: Past Surgical History:  Procedure Laterality Date   COLONOSCOPY     COLONOSCOPY WITH PROPOFOL N/A 10/01/2017   Procedure: COLONOSCOPY WITH PROPOFOL;  Surgeon: Toledo, Boykin Nearing, MD;  Location: [facility] ENDOSCOPY;  Service: Endoscopy;  Laterality: N/A;   PROSTATE BIOPSY      Home Medications:  Allergies as of 06/21/2021       Reactions   Lisinopril Cough        Medication List        Accurate as of June 21, 2021 11:25 AM. If you have any questions, ask your nurse or doctor.          fluocinonide ointment 0.05 % Commonly known as: LIDEX Apply topically 2 (two) times daily.   hydrochlorothiazide 25 MG tablet Commonly known as: HYDRODIURIL Take 25 mg by mouth daily.   hydrOXYzine 25 MG tablet Commonly known as: ATARAX Take 0.5-1 tablets (12.5-25 mg total) by mouth 3 (three) times  daily as needed. For severe anxiety   metFORMIN 500 MG 24 hr tablet Commonly known as: GLUCOPHAGE-XR Take by mouth.   naproxen 500 MG tablet Commonly known as: NAPROSYN TK 1 T PO BID WC   pravastatin 20 MG tablet Commonly known as: PRAVACHOL Take 20 mg by mouth daily.   QUEtiapine 25 MG tablet Commonly known as: SEROQUEL TAKE 1 TABLET(25 MG) BY MOUTH AT BEDTIME FOR MOOD OR SLEEP   spironolactone 25 MG tablet Commonly known as: ALDACTONE   tamsulosin 0.4 MG Caps capsule Commonly known as: FLOMAX Take 1 capsule (0.4 mg total) by mouth daily. Started by: Riki Altes, MD   valsartan 80 MG tablet Commonly known as: DIOVAN Take by mouth.   Vilazodone HCl 20 MG Tabs Commonly known as: Viibryd TAKE 1 TABLET(20 MG) BY MOUTH DAILY        Allergies:  Allergies  Allergen Reactions   Lisinopril Cough    Family History: Family History  Problem Relation Age of Onset   Cancer Mother        Pancreatic   Diabetes Mother    Hypertension Mother    Varicose Veins Mother    Alcohol abuse Mother    Heart disease Father        CABG   Cancer Father  Jaw   Aortic aneurysm Father    Congestive Heart Failure Father    Hypertension Father    Varicose Veins Father    Alcohol abuse Father    Bipolar disorder Sister    Bipolar disorder Brother     Social History:  reports that he has never smoked. He has never used smokeless tobacco. He reports current drug use. Drug: Marijuana. He reports that he does not drink alcohol.   Physical Exam: BP 132/77    Pulse 91    Ht 6' (1.829 m)    Wt 278 lb (126.1 kg)    BMI 37.70 kg/m   Constitutional:  Alert and oriented, No acute distress. HEENT: [place] AT, moist mucus membranes.  Trachea midline, no masses. Cardiovascular: No clubbing, cyanosis, or edema. Respiratory: Normal respiratory effort, no increased work of breathing. GI: Abdomen is soft, nontender, nondistended, no abdominal masses GU: Prostate 40 g, smooth without  nodules   Assessment & Plan:   66 y.o.male with a PSA elevated above baseline at 3.81 We discussed concept of PSA velocity and that an abnormal PSA velocity is based on 3 successive readings Recommend a repeat PSA in 1 month after a 30-day course of tamsulosin 0.4 mg If PSA velocity abnormal will proceed with prostate MRI to assess for any evidence of high-grade lesion   Riki Altes, MD  [facility] [address] [place], [place] [address] [phone]

## 2021-06-30 ENCOUNTER — Other Ambulatory Visit: Payer: Self-pay | Admitting: Psychiatry

## 2021-06-30 DIAGNOSIS — F4010 Social phobia, unspecified: Secondary | ICD-10-CM

## 2021-07-19 ENCOUNTER — Other Ambulatory Visit: Payer: Medicare HMO

## 2021-07-19 ENCOUNTER — Other Ambulatory Visit: Payer: Self-pay

## 2021-07-19 DIAGNOSIS — R972 Elevated prostate specific antigen [PSA]: Secondary | ICD-10-CM

## 2021-07-20 LAB — PSA: Prostate Specific Ag, Serum: 0.8 ng/mL (ref 0.0–4.0)

## 2021-07-23 ENCOUNTER — Telehealth: Payer: Self-pay | Admitting: *Deleted

## 2021-07-23 NOTE — Telephone Encounter (Signed)
-----   Message from Riki Altes, MD sent at 07/22/2021 10:41 AM EDT ----- ?Repeat PSA was back to baseline at 0.8.  No further evaluation required.  Continue annual PSA with his PCP ?

## 2021-07-25 NOTE — Telephone Encounter (Signed)
Left message

## 2021-07-29 ENCOUNTER — Other Ambulatory Visit: Payer: Self-pay | Admitting: Psychiatry

## 2021-07-29 ENCOUNTER — Other Ambulatory Visit: Payer: Self-pay | Admitting: Urology

## 2021-07-29 DIAGNOSIS — F410 Panic disorder [episodic paroxysmal anxiety] without agoraphobia: Secondary | ICD-10-CM

## 2021-08-23 ENCOUNTER — Encounter: Payer: Self-pay | Admitting: Psychiatry

## 2021-08-23 ENCOUNTER — Ambulatory Visit (INDEPENDENT_AMBULATORY_CARE_PROVIDER_SITE_OTHER): Payer: Medicare HMO | Admitting: Psychiatry

## 2021-08-23 VITALS — BP 158/77 | HR 91 | Ht 72.0 in | Wt 281.0 lb

## 2021-08-23 DIAGNOSIS — F4010 Social phobia, unspecified: Secondary | ICD-10-CM | POA: Diagnosis not present

## 2021-08-23 DIAGNOSIS — F410 Panic disorder [episodic paroxysmal anxiety] without agoraphobia: Secondary | ICD-10-CM | POA: Diagnosis not present

## 2021-08-23 DIAGNOSIS — F5101 Primary insomnia: Secondary | ICD-10-CM

## 2021-08-23 DIAGNOSIS — F3342 Major depressive disorder, recurrent, in full remission: Secondary | ICD-10-CM

## 2021-08-23 MED ORDER — VILAZODONE HCL 20 MG PO TABS: 10.0000 mg | ORAL_TABLET | Freq: Every day | ORAL | 1 refills | Status: DC

## 2021-08-23 MED ORDER — QUETIAPINE FUMARATE 25 MG PO TABS: 25.0000 mg | ORAL_TABLET | Freq: Every evening | ORAL | 1 refills | Status: DC | PRN

## 2021-08-23 NOTE — Progress Notes (Signed)
[facility] MD OP Progress Note ? ?08/23/2021 11:35 AM ?Kyle LordsAlan Hood  ?MRN:  [id number] ? ?Chief Complaint:  ?Chief Complaint  ?Patient presents with  ? Follow-up: 66 year old transgender male to male with history of MDD, panic disorder, social anxiety disorder, presented for medication management.  ? ?HPI: Kyle Hood is a 66 year old with history of MDD, panic disorder, social anxiety disorder, transgender male to male, lives in [place], was evaluated in office today. ? ?Patient today reports they are currently doing well.  Denies any significant mood lability, sadness or anxiety.  Does worry about going to public places where there is a lot of discrimination like grocery stores.  However continues to visit the stores and has been managing it okay so far. ? ?Reports sleep as good. ? ?Reports appetite is fair. ? ?Denies suicidality, homicidality or perceptual disturbances. ? ?Currently compliant on medications.  Denies side effects. ? ?Interested in being weaned off of the Viibryd, will reduce the dosage to a 10 mg, agreeable. ? ?Denies any other concerns today. ? ?Visit Diagnosis:  ?  ICD-10-CM   ?1. MDD (major depressive disorder), recurrent, in full remission ([facility])  F33.42 QUEtiapine (SEROQUEL) 25 MG tablet  ?  ?2. Social anxiety disorder  F40.10 Vilazodone HCl 20 MG TABS  ?  ?3. Panic disorder  F41.0 QUEtiapine (SEROQUEL) 25 MG tablet  ?  ?4. Primary insomnia  F51.01   ?  ? ? ?Past Psychiatric History: Reviewed past psychiatric history from progress note on 06/05/2017.  Past trials of Zoloft, Wellbutrin. ? ?Past Medical History:  ?Past Medical History:  ?Diagnosis Date  ? Abnormal liver function test   ? Anxiety   ? Anxiety   ? Arthritis   ? Chronic lower back pain   ? Degenerative joint disease   ? Depression   ? Fatty liver   ? Heart murmur   ? Hepatitis B   ? Hyperlipidemia   ? Hypertension   ? Lumbar polyradiculopathy   ? Panic attacks   ? Polyneuropathy   ? Vertigo 05/03/2017  ? ER  ?  ?Past Surgical History:   ?Procedure Laterality Date  ? COLONOSCOPY    ? COLONOSCOPY WITH PROPOFOL N/A 10/01/2017  ? Procedure: COLONOSCOPY WITH PROPOFOL;  Surgeon: Toledo, [name], MD;  Location: [facility] ENDOSCOPY;  Service: Endoscopy;  Laterality: N/A;  ? PROSTATE BIOPSY    ? ? ?Family Psychiatric History: Reviewed family psychiatric history from progress note on 06/05/2017. ? ?Family History:  ?Family History  ?Problem Relation Age of Onset  ? Cancer Mother   ?     Pancreatic  ? Diabetes Mother   ? Hypertension Mother   ? Varicose Veins Mother   ? Alcohol abuse Mother   ? Heart disease Father   ?     CABG  ? Cancer Father   ?     Jaw  ? Aortic aneurysm Father   ? Congestive Heart Failure Father   ? Hypertension Father   ? Varicose Veins Father   ? Alcohol abuse Father   ? Bipolar disorder Sister   ? Bipolar disorder Brother   ? ? ?Social History: Reviewed social history from progress note on 06/05/2017. ?Social History  ? ?Socioeconomic History  ? Marital status: Single  ?  Spouse name: denise  ? Number of children: 0  ? Years of education: Not on file  ? Highest education level: 11th grade  ?Occupational History  ?  Comment: disabled  ?Tobacco Use  ? Smoking status: Never  ?  Smokeless tobacco: Never  ?Vaping Use  ? Vaping Use: Never used  ?Substance and Sexual Activity  ? Alcohol use: No  ? Drug use: Yes  ?  Types: Marijuana  ? Sexual activity: Not Currently  ?Other Topics Concern  ? Not on file  ?Social History Narrative  ? Not on file  ? ?Social Determinants of Health  ? ?Financial Resource Strain: Not on file  ?Food Insecurity: Not on file  ?Transportation Needs: Not on file  ?Physical Activity: Not on file  ?Stress: Not on file  ?Social Connections: Not on file  ? ? ?Allergies:  ?Allergies  ?Allergen Reactions  ? Lisinopril Cough  ? ? ?Metabolic Disorder Labs: ?Lab Results  ?Component Value Date  ? HGBA1C 6.1 08/16/2014  ? ?No results found for: PROLACTIN ?Lab Results  ?Component Value Date  ? CHOL 145 08/16/2014  ? TRIG 320 (A) 08/16/2014   ? HDL 36 08/16/2014  ? LDLCALC 45 08/16/2014  ? ?Lab Results  ?Component Value Date  ? TSH 1.61 08/16/2014  ? ? ?Therapeutic Level Labs: ?No results found for: LITHIUM ?No results found for: VALPROATE ?No components found for:  CBMZ ? ?Current Medications: ?Current Outpatient Medications  ?Medication Sig Dispense Refill  ? fluocinonide ointment (LIDEX) 0.05 % Apply topically 2 (two) times daily.    ? hydrochlorothiazide (HYDRODIURIL) 25 MG tablet Take 25 mg by mouth daily.    ? hydrOXYzine (ATARAX) 25 MG tablet Take 0.5-1 tablets (12.5-25 mg total) by mouth 3 (three) times daily as needed (severe anxiety). 90 tablet 0  ? metFORMIN (GLUCOPHAGE-XR) 500 MG 24 hr tablet Take by mouth.    ? naproxen (NAPROSYN) 500 MG tablet TK 1 T PO BID WC  3  ? pravastatin (PRAVACHOL) 20 MG tablet Take 20 mg by mouth daily.    ? spironolactone (ALDACTONE) 25 MG tablet     ? tamsulosin (FLOMAX) 0.4 MG CAPS capsule TAKE 1 CAPSULE(0.4 MG) BY MOUTH DAILY 90 capsule 1  ? valsartan (DIOVAN) 80 MG tablet Take by mouth.    ? QUEtiapine (SEROQUEL) 25 MG tablet Take 1 tablet (25 mg total) by mouth at bedtime as needed. 90 tablet 1  ? Vilazodone HCl 20 MG TABS Take 0.5 tablets (10 mg total) by mouth daily with breakfast. 90 tablet 1  ? ?No current facility-administered medications for this visit.  ? ? ? ?Musculoskeletal: ?Strength & Muscle Tone: within normal limits ?Gait & Station: normal ?Patient leans: N/A ? ?Psychiatric Specialty Exam: ?Review of Systems  ?Psychiatric/Behavioral:  Negative for agitation, confusion, decreased concentration, dysphoric mood, sleep disturbance and suicidal ideas. The patient is not nervous/anxious.   ?All other systems reviewed and are negative.  ?Blood pressure (!) 158/77, pulse 91, height 6' (1.829 m), weight 281 lb (127.5 kg).Body mass index is 38.11 kg/m?.  ?General Appearance: Casual  ?Eye Contact:  Fair  ?Speech:  Clear and Coherent  ?Volume:  Normal  ?Mood:  Euthymic  ?Affect:  Congruent  ?Thought  Process:  Goal Directed and Descriptions of Associations: Intact  ?Orientation:  Full (Time, Place, and Person)  ?Thought Content: Logical   ?Suicidal Thoughts:  No  ?Homicidal Thoughts:  No  ?Memory:  Immediate;   Fair ?Recent;   Fair ?Remote;   Fair  ?Judgement:  Fair  ?Insight:  Fair  ?Psychomotor Activity:  Normal  ?Concentration:  Concentration: Fair and Attention Span: Fair  ?Recall:  Fair  ?Fund of Knowledge: Fair  ?Language: Fair  ?Akathisia:  No  ?Handed:  Right  ?AIMS (  if indicated): done  ?Assets:  Communication Skills ?Desire for Improvement ?Housing ?Intimacy ?Social Support ?Transportation  ?ADL's:  Intact  ?Cognition: WNL  ?Sleep:  Fair  ? ?Screenings: ?AIMS   ? ?Flowsheet Row Office Visit from 08/23/2021 in [facility] Office Visit from 05/17/2021 in [facility]  ?AIMS Total Score 0 0  ? ?  ? ?GAD-7   ? ?Flowsheet Row Office Visit from 08/23/2021 in [facility] Office Visit from 05/17/2021 in [facility] Office Visit from 02/15/2021 in [facility] Office Visit from 11/30/2020 in [facility] Counselor from 06/14/2020 in [facility] [place]  ?Total GAD-7 Score 0 4 1 1 9   ? ?  ? ?PHQ2-9   ? ?Flowsheet Row Office Visit from 08/23/2021 in [facility] Office Visit from 05/17/2021 in [facility] Office Visit from 02/15/2021 in [facility] Office Visit from 11/30/2020 in [facility] Office Visit from 08/17/2020 in [facility]  ?PHQ-2 Total Score 0 0 0 0 2  ?PHQ-9 Total Score 0 0 -- 3 4  ? ?  ? ?Flowsheet Row Office Visit from 05/17/2021 in [facility] Office Visit from 02/15/2021 in [facility] ED from 02/04/2021 in [facility]  REGIONAL [facility] EMERGENCY DEPARTMENT  ?C-SSRS RISK CATEGORY Low Risk Low Risk No Risk  ? ?  ? ? ? ?Assessment and Plan: Albie Arizpe is a 66 year old male to male transgender who has a history

## 2021-11-15 DIAGNOSIS — E66811 Obesity, class 1: Secondary | ICD-10-CM | POA: Insufficient documentation

## 2021-11-15 DIAGNOSIS — E669 Obesity, unspecified: Secondary | ICD-10-CM | POA: Insufficient documentation

## 2021-11-19 ENCOUNTER — Ambulatory Visit (INDEPENDENT_AMBULATORY_CARE_PROVIDER_SITE_OTHER): Payer: Medicare HMO | Admitting: Psychiatry

## 2021-11-19 ENCOUNTER — Encounter: Payer: Self-pay | Admitting: Psychiatry

## 2021-11-19 VITALS — BP 146/85 | HR 90 | Temp 97.9°F | Wt 273.8 lb

## 2021-11-19 DIAGNOSIS — F4010 Social phobia, unspecified: Secondary | ICD-10-CM | POA: Diagnosis not present

## 2021-11-19 DIAGNOSIS — F5101 Primary insomnia: Secondary | ICD-10-CM | POA: Diagnosis not present

## 2021-11-19 DIAGNOSIS — F410 Panic disorder [episodic paroxysmal anxiety] without agoraphobia: Secondary | ICD-10-CM | POA: Diagnosis not present

## 2021-11-19 DIAGNOSIS — F3342 Major depressive disorder, recurrent, in full remission: Secondary | ICD-10-CM | POA: Diagnosis not present

## 2021-11-19 MED ORDER — TRAZODONE HCL 50 MG PO TABS: 50.0000 mg | ORAL_TABLET | Freq: Every evening | ORAL | 0 refills | Status: DC | PRN

## 2021-11-19 NOTE — Progress Notes (Unsigned)
BH MD OP Progress Note  11/19/2021 1:21 PM Kyle Hood  MRN:  [id number]  Chief Complaint:  Chief Complaint  Patient presents with   Follow-up: 66 year old transgender male to male with history of MDD, panic disorder, social anxiety disorder, presented for medication management.   HPI: Kyle Hood is a 66 year old with history of MDD, panic disorder, social anxiety disorder, transgender male to male, lives in [place] was evaluated in office today.  Patient goes with pronoun ' He'.  Patient reports he took the last dosage of Viibryd yesterday.  He was tapered down to 10 mg with plan to discontinue if he tolerates it well.  Currently does not have any significant depressive symptoms.  Reports he does worry about situational stressors.  His worry is mostly about whether he will be denied disability if he tries to come off of his medications.  Patient reports he does have support from spouse who is currently retired and gives him company.  They do a lot of things together and that has been helpful.  Patient reports sleep as good.  Uses the Seroquel only as needed a few times a month.  Used to be on trazodone previously which was helpful with sleep.  Agreeable to go back to trazodone now that his mood symptoms are better and due to long-term adverse side effects of Seroquel.  Patient denies any suicidality, homicidality or perceptual disturbances.  Patient denies any other concerns today.    Visit Diagnosis:    ICD-10-CM   1. MDD (major depressive disorder), recurrent, in full remission (HCC)  F33.42     2. Social anxiety disorder  F40.10     3. Panic disorder  F41.0     4. Primary insomnia  F51.01 traZODone (DESYREL) 50 MG tablet      Past Psychiatric History: Reviewed past psychiatric history from progress note on 06/05/2017.  Past trials of Zoloft, Wellbutrin.  Past Medical History:  Past Medical History:  Diagnosis Date   Abnormal liver function test    Anxiety     Anxiety    Arthritis    Chronic lower back pain    Degenerative joint disease    Depression    Fatty liver    Heart murmur    Hepatitis B    Hyperlipidemia    Hypertension    Lumbar polyradiculopathy    Panic attacks    Polyneuropathy    Vertigo 05/03/2017   ER    Past Surgical History:  Procedure Laterality Date   COLONOSCOPY     COLONOSCOPY WITH PROPOFOL N/A 10/01/2017   Procedure: COLONOSCOPY WITH PROPOFOL;  Surgeon: Toledo, Boykin Nearing, MD;  Location: [facility] ENDOSCOPY;  Service: Endoscopy;  Laterality: N/A;   PROSTATE BIOPSY      Family Psychiatric History: Reviewed family psychiatric history from progress note on 06/05/2017.  Family History:  Family History  Problem Relation Age of Onset   Cancer Mother        Pancreatic   Diabetes Mother    Hypertension Mother    Varicose Veins Mother    Alcohol abuse Mother    Heart disease Father        CABG   Cancer Father        Jaw   Aortic aneurysm Father    Congestive Heart Failure Father    Hypertension Father    Varicose Veins Father    Alcohol abuse Father    Bipolar disorder Sister    Bipolar disorder Brother     Social  History: Reviewed social history from progress note on 06/05/2017. Social History   Socioeconomic History   Marital status: Single    Spouse name: denise   Number of children: 0   Years of education: Not on file   Highest education level: 11th grade  Occupational History    Comment: disabled  Tobacco Use   Smoking status: Never   Smokeless tobacco: Never  Vaping Use   Vaping Use: Never used  Substance and Sexual Activity   Alcohol use: No   Drug use: Yes    Types: Marijuana   Sexual activity: Not Currently  Other Topics Concern   Not on file  Social History Narrative   Not on file   Social Determinants of Health   Financial Resource Strain: High Risk (03/19/2017)   Overall Financial Resource Strain (CARDIA)    Difficulty of Paying Living Expenses: Very hard  Food Insecurity: No Food  Insecurity (03/19/2017)   Hunger Vital Sign    Worried About Running Out of Food in the Last Year: Never true    Ran Out of Food in the Last Year: Never true  Transportation Needs: No Transportation Needs (03/19/2017)   PRAPARE - Administrator, Civil Service (Medical): No    Lack of Transportation (Non-Medical): No  Physical Activity: Inactive (03/19/2017)   Exercise Vital Sign    Days of Exercise per Week: 0 days    Minutes of Exercise per Session: 0 min  Stress: Not on file  Social Connections: Moderately Isolated (03/19/2017)   Social Connection and Isolation Panel [NHANES]    Frequency of Communication with Friends and Family: Twice a week    Frequency of Social Gatherings with Friends and Family: Never    Attends Religious Services: Never    Database administrator or Organizations: No    Attends Banker Meetings: Never    Marital Status: Living with partner    Allergies:  Allergies  Allergen Reactions   Lisinopril Cough    Metabolic Disorder Labs: Lab Results  Component Value Date   HGBA1C 6.1 08/16/2014   No results found for: "PROLACTIN" Lab Results  Component Value Date   CHOL 145 08/16/2014   TRIG 320 (A) 08/16/2014   HDL 36 08/16/2014   LDLCALC 45 08/16/2014   Lab Results  Component Value Date   TSH 1.61 08/16/2014    Therapeutic Level Labs: No results found for: "LITHIUM" No results found for: "VALPROATE" No results found for: "CBMZ"  Current Medications: Current Outpatient Medications  Medication Sig Dispense Refill   amLODipine (NORVASC) 5 MG tablet Take 5 mg by mouth daily.     fluocinonide ointment (LIDEX) 0.05 % Apply topically 2 (two) times daily.     hydrochlorothiazide (HYDRODIURIL) 25 MG tablet Take 25 mg by mouth daily.     hydrOXYzine (ATARAX) 25 MG tablet Take 25 mg by mouth 3 (three) times daily as needed for anxiety.     naproxen (NAPROSYN) 500 MG tablet TK 1 T PO BID WC  3   pravastatin (PRAVACHOL) 20 MG  tablet Take 20 mg by mouth daily.     spironolactone (ALDACTONE) 25 MG tablet      tamsulosin (FLOMAX) 0.4 MG CAPS capsule TAKE 1 CAPSULE(0.4 MG) BY MOUTH DAILY 90 capsule 1   traZODone (DESYREL) 50 MG tablet Take 1 tablet (50 mg total) by mouth at bedtime as needed for sleep. Stop Quetiapine 90 tablet 0   valsartan (DIOVAN) 80 MG tablet Take by mouth.  No current facility-administered medications for this visit.     Musculoskeletal: Strength & Muscle Tone: within normal limits Gait & Station: normal Patient leans: N/A  Psychiatric Specialty Exam: Review of Systems  Psychiatric/Behavioral:  The patient is nervous/anxious.   All other systems reviewed and are negative.   Blood pressure (!) 146/85, pulse 90, temperature 97.9 F (36.6 C), temperature source Temporal, weight 273 lb 12.8 oz (124.2 kg).Body mass index is 37.13 kg/m.  General Appearance: Casual  Eye Contact:  Fair  Speech:  Clear and Coherent  Volume:  Normal  Mood:  Anxious  Affect:  Congruent  Thought Process:  Goal Directed and Descriptions of Associations: Intact  Orientation:  Full (Time, Place, and Person)  Thought Content: Logical   Suicidal Thoughts:  No  Homicidal Thoughts:  No  Memory:  Immediate;   Fair Recent;   Fair Remote;   Fair  Judgement:  Fair  Insight:  Fair  Psychomotor Activity:  Normal  Concentration:  Concentration: Fair and Attention Span: Fair  Recall:  [employer] of Knowledge: Fair  Language: Fair  Akathisia:  No  Handed:  Right  AIMS (if indicated): done  Assets:  Communication Skills Desire for Kyle Hood Talents/Skills Transportation  ADL's:  Intact  Cognition: WNL  Sleep:  Fair   Screenings: [place] Office Visit from 08/23/2021 in [place] Office Visit from 05/17/2021 in [place] Total Score 0 0      GAD-7    [place] Visit from  11/19/2021 in [place] Office Visit from 08/23/2021 in [place] Office Visit from 05/17/2021 in [place] from 02/15/2021 in [place] Visit from 11/30/2020 in [place]  Total GAD-7 Score 3 0 4 1 1       PHQ2-9    [place] Visit from 11/19/2021 in [place] Visit from 08/23/2021 in [place] Visit from 05/17/2021 in [place] from 02/15/2021 in [place] Visit from 11/30/2020 in [place]  PHQ-2 Total Score 2 0 0 0 0  PHQ-9 Total Score 5 0 0 -- 3      [place] Visit from 11/19/2021 in [place] Visit from 05/17/2021 in [place] Office Visit from 02/15/2021 in [place]        Assessment and Plan: Tresean Trame is a 66 year old male to male, transgender, has a history of depression, anxiety, insomnia, currently being tapered down from antidepressant, tolerating it well however will benefit from addition of sleep medication.  Discussed plan as noted below.  Plan Depression in remission Discontinue Viibryd.  Tapered off.  Patient to monitor her symptoms closely.  Social/panic disorder-stable Will monitor closely. Continue hydroxyzine 25 mg p.o. 3 times daily as needed for severe anxiety.  Used to be in psychotherapy before.  Could restart CBT as needed.  Insomnia-stable Discontinue Seroquel for long-term adverse side effects.  Uses it only as needed few times a month. Will start trazodone 50 mg p.o. nightly as needed Continue sleep hygiene techniques.  Follow-up in  clinic in 3 to 4 months or sooner if symptoms worsen.  This note was generated in part or whole with voice recognition software.  Voice recognition is usually quite accurate but there are transcription errors that can and very often do occur. I apologize for any typographical errors that were not detected and corrected.     Ursula Alert, MD 11/19/2021, 1:21 PM

## 2021-11-19 NOTE — Patient Instructions (Signed)
Trazodone Tablets What is this medication? TRAZODONE (TRAZ oh done) treats depression. It increases the amount of serotonin in the brain, a hormone that helps regulate mood. This medicine may be used for other purposes; ask your health care provider or pharmacist if you have questions. COMMON BRAND NAME(S): Desyrel What should I tell my care team before I take this medication? They need to know if you have any of these conditions: Attempted suicide or thinking about it Bipolar disorder Bleeding problems Glaucoma Heart disease, or previous heart attack Irregular heart beat Kidney or liver disease Low levels of sodium in the blood An unusual or allergic reaction to trazodone, other medications, foods, dyes or preservatives Pregnant or trying to get pregnant Breast-feeding How should I use this medication? Take this medication by mouth with a glass of water. Follow the directions on the prescription label. Take this medication shortly after a meal or a light snack. Take your medication at regular intervals. Do not take your medication more often than directed. Do not stop taking this medication suddenly except upon the advice of your care team. Stopping this medication too quickly may cause serious side effects or your condition may worsen. A special MedGuide will be given to you by the pharmacist with each prescription and refill. Be sure to read this information carefully each time. Talk to your care team regarding the use of this medication in children. Special care may be needed. Overdosage: If you think you have taken too much of this medicine contact a poison control center or emergency room at once. NOTE: This medicine is only for you. Do not share this medicine with others. What if I miss a dose? If you miss a dose, take it as soon as you can. If it is almost time for your next dose, take only that dose. Do not take double or extra doses. What may interact with this medication? Do not  take this medication with any of the following: Certain medications for fungal infections like fluconazole, itraconazole, ketoconazole, posaconazole, voriconazole Cisapride Dronedarone Linezolid MAOIs like Carbex, Eldepryl, Marplan, Nardil, and Parnate Mesoridazine Methylene blue (injected into a vein) Pimozide Saquinavir Thioridazine This medication may also interact with the following: Alcohol Antiviral medications for HIV or AIDS Aspirin and aspirin-like medications Barbiturates like phenobarbital Certain medications for blood pressure, heart disease, irregular heart beat Certain medications for depression, anxiety, or psychotic disturbances Certain medications for migraine headache like almotriptan, eletriptan, frovatriptan, naratriptan, rizatriptan, sumatriptan, zolmitriptan Certain medications for seizures like carbamazepine and phenytoin Certain medications for sleep Certain medications that treat or prevent blood clots like dalteparin, enoxaparin, warfarin Digoxin Fentanyl Lithium NSAIDS, medications for pain and inflammation, like ibuprofen or naproxen Other medications that prolong the QT interval (cause an abnormal heart rhythm) like dofetilide Rasagiline Supplements like St. John's wort, kava kava, valerian Tramadol Tryptophan This list may not describe all possible interactions. Give your health care provider a list of all the medicines, herbs, non-prescription drugs, or dietary supplements you use. Also tell them if you smoke, drink alcohol, or use illegal drugs. Some items may interact with your medicine. What should I watch for while using this medication? Tell your care team if your symptoms do not get better or if they get worse. Visit your care team for regular checks on your progress. Because it may take several weeks to see the full effects of this medication, it is important to continue your treatment as prescribed by your care team. Watch for new or worsening  thoughts of   suicide or depression. This includes sudden changes in mood, behaviors, or thoughts. These changes can happen at any time but are more common in the beginning of treatment or after a change in dose. Call your care team right away if you experience these thoughts or worsening depression. Manic episodes may happen in patients with bipolar disorder who take this medication. Watch for changes in feelings or behaviors such as feeling anxious, nervous, agitated, panicky, irritable, hostile, aggressive, impulsive, severely restless, overly excited and hyperactive, or trouble sleeping. These changes can happen at any time but are more common in the beginning of treatment or after a change in dose. Call your care team right away if you notice any of these symptoms. You may get drowsy or dizzy. Do not drive, use machinery, or do anything that needs mental alertness until you know how this medication affects you. Do not stand or sit up quickly, especially if you are an older patient. This reduces the risk of dizzy or fainting spells. Alcohol may interfere with the effect of this medication. Avoid alcoholic drinks. This medication may cause dry eyes and blurred vision. If you wear contact lenses you may feel some discomfort. Lubricating drops may help. See your eye doctor if the problem does not go away or is severe. Your mouth may get dry. Chewing sugarless gum, sucking hard candy and drinking plenty of water may help. Contact your care team if the problem does not go away or is severe. What side effects may I notice from receiving this medication? Side effects that you should report to your care team as soon as possible: Allergic reactions--skin rash, itching, hives, swelling of the face, lips, tongue, or throat Bleeding--bloody or black, tar-like stools, red or dark brown urine, vomiting blood or brown material that looks like coffee grounds, small, red or purple spots on skin, unusual bleeding or  bruising Heart rhythm changes--fast or irregular heartbeat, dizziness, feeling faint or lightheaded, chest pain, trouble breathing Low blood pressure--dizziness, feeling faint or lightheaded, blurry vision Low sodium level--muscle weakness, fatigue, dizziness, headache, confusion Prolonged or painful erection Serotonin syndrome--irritability, confusion, fast or irregular heartbeat, muscle stiffness, twitching muscles, sweating, high fever, seizures, chills, vomiting, diarrhea Sudden eye pain or change in vision such as blurry vision, seeing halos around lights, vision loss Thoughts of suicide or self-harm, worsening mood, feelings of depression Side effects that usually do not require medical attention (report to your care team if they continue or are bothersome): Change in sex drive or performance Constipation Dizziness Drowsiness Dry mouth This list may not describe all possible side effects. Call your doctor for medical advice about side effects. You may report side effects to FDA at 1-800-FDA-1088. Where should I keep my medication? Keep out of the reach of children and pets. Store at room temperature between 15 and 30 degrees C (59 to 86 degrees F). Protect from light. Keep container tightly closed. Throw away any unused medication after the expiration date. NOTE: This sheet is a summary. It may not cover all possible information. If you have questions about this medicine, talk to your doctor, pharmacist, or health care provider.  2023 Elsevier/Gold Standard (2020-04-05 00:00:00)  

## 2021-11-22 ENCOUNTER — Ambulatory Visit: Payer: No Typology Code available for payment source | Admitting: Psychiatry

## 2021-12-22 ENCOUNTER — Other Ambulatory Visit: Payer: Self-pay | Admitting: Psychiatry

## 2021-12-22 DIAGNOSIS — F5101 Primary insomnia: Secondary | ICD-10-CM

## 2022-02-20 DIAGNOSIS — I340 Nonrheumatic mitral (valve) insufficiency: Secondary | ICD-10-CM | POA: Insufficient documentation

## 2022-02-20 DIAGNOSIS — I361 Nonrheumatic tricuspid (valve) insufficiency: Secondary | ICD-10-CM | POA: Insufficient documentation

## 2022-03-18 ENCOUNTER — Other Ambulatory Visit: Payer: Self-pay | Admitting: Urology

## 2022-03-25 NOTE — Progress Notes (Unsigned)
03/26/2022 12:42 PM   Kyle Hood 02/07/1956 [id number]  Referring provider: Orene Desanctis, MD [address] [name],  [place] [address]  Urological history: 1. Elevated PSA -PSA (06/2021) 0.8 -jumped to 3.81 (04/2021) from 1.0 (2020)  2. BPH w/LU TS -tamsulosin 0.4 mg daily    No chief complaint on file.   HPI: Kyle Hood is a 66 y.o. male who presents today for burning with urination.    PMH: Past Medical History:  Diagnosis Date   Abnormal liver function test    Anxiety    Anxiety    Arthritis    Chronic lower back pain    Degenerative joint disease    Depression    Fatty liver    Heart murmur    Hepatitis B    Hyperlipidemia    Hypertension    Lumbar polyradiculopathy    Panic attacks    Polyneuropathy    Vertigo 05/03/2017   ER    Surgical History: Past Surgical History:  Procedure Laterality Date   COLONOSCOPY     COLONOSCOPY WITH PROPOFOL N/A 10/01/2017   Procedure: COLONOSCOPY WITH PROPOFOL;  Surgeon: Toledo, Boykin Nearing, MD;  Location: [facility] ENDOSCOPY;  Service: Endoscopy;  Laterality: N/A;   PROSTATE BIOPSY      Home Medications:  Allergies as of 03/26/2022       Reactions   Lisinopril Cough        Medication List        Accurate as of March 25, 2022 12:42 PM. If you have any questions, ask your nurse or doctor.          amLODipine 5 MG tablet Commonly known as: NORVASC Take 5 mg by mouth daily.   hydrochlorothiazide 25 MG tablet Commonly known as: HYDRODIURIL Take 25 mg by mouth daily.   hydrOXYzine 25 MG tablet Commonly known as: ATARAX Take 25 mg by mouth 3 (three) times daily as needed for anxiety.   naproxen 500 MG tablet Commonly known as: NAPROSYN TK 1 T PO BID WC   pravastatin 20 MG tablet Commonly known as: PRAVACHOL Take 20 mg by mouth daily.   spironolactone 25 MG tablet Commonly known as: ALDACTONE   tamsulosin 0.4 MG Caps capsule Commonly known as: FLOMAX TAKE 1 CAPSULE(0.4 MG) BY MOUTH  DAILY   traZODone 50 MG tablet Commonly known as: DESYREL Take 1 tablet (50 mg total) by mouth at bedtime as needed for sleep. Stop Quetiapine   valsartan 80 MG tablet Commonly known as: DIOVAN Take by mouth.        Allergies:  Allergies  Allergen Reactions   Lisinopril Cough    Family History: Family History  Problem Relation Age of Onset   Cancer Mother        Pancreatic   Diabetes Mother    Hypertension Mother    Varicose Veins Mother    Alcohol abuse Mother    Heart disease Father        CABG   Cancer Father        Jaw   Aortic aneurysm Father    Congestive Heart Failure Father    Hypertension Father    Varicose Veins Father    Alcohol abuse Father    Bipolar disorder Sister    Bipolar disorder Brother     Social History:  reports that he has never smoked. He has never used smokeless tobacco. He reports current drug use. Drug: Marijuana. He reports that he does not drink alcohol.  ROS:  Pertinent ROS in HPI  Physical Exam: There were no vitals taken for this visit.  Constitutional:  Well nourished. Alert and oriented, No acute distress. HEENT: [place] AT, moist mucus membranes.  Trachea midline, no masses. Cardiovascular: No clubbing, cyanosis, or edema. Respiratory: Normal respiratory effort, no increased work of breathing. GI: Abdomen is soft, non tender, non distended, no abdominal masses. Liver and spleen not palpable.  No hernias appreciated.  Stool sample for occult testing is not indicated.   GU: No CVA tenderness.  No bladder fullness or masses.  Patient with circumcised/uncircumcised phallus. ***Foreskin easily retracted***  Urethral meatus is patent.  No penile discharge. No penile lesions or rashes. Scrotum without lesions, cysts, rashes and/or edema.  Testicles are located scrotally bilaterally. No masses are appreciated in the testicles. Left and right epididymis are normal. Rectal: Patient with  normal sphincter tone. Anus and perineum without  scarring or rashes. No rectal masses are appreciated. Prostate is approximately *** grams, *** nodules are appreciated. Seminal vesicles are normal. Skin: No rashes, bruises or suspicious lesions. Lymph: No cervical or inguinal adenopathy. Neurologic: Grossly intact, no focal deficits, moving all 4 extremities. Psychiatric: Normal mood and affect.  Laboratory Data: Hemoglobin (01/2022) 8.7% Serum creatinine (01/2022) 0.8 Urinalysis ***  I have reviewed the labs.   Pertinent Imaging: ***  Assessment & Plan:  ***  1. Dysuria -explained how uncontrolled DM contribute to LU TS -UA *** -urine culture  No follow-ups on file.  These notes generated with voice recognition software. I apologize for typographical errors.  [name]  [facility] [address]  [address] [place], [place] [address] [phone]

## 2022-03-26 ENCOUNTER — Ambulatory Visit
Admission: RE | Admit: 2022-03-26 | Discharge: 2022-03-26 | Disposition: A | Discharge status: Home / Self Care | Payer: Medicare HMO | Source: Ambulatory Visit | Attending: Urology | Admitting: Urology

## 2022-03-26 ENCOUNTER — Telehealth: Payer: Self-pay | Admitting: Urology

## 2022-03-26 ENCOUNTER — Ambulatory Visit (INDEPENDENT_AMBULATORY_CARE_PROVIDER_SITE_OTHER): Payer: Medicare HMO | Admitting: Urology

## 2022-03-26 VITALS — BP 156/85 | HR 86 | Ht 74.0 in | Wt 275.0 lb

## 2022-03-26 DIAGNOSIS — K5909 Other constipation: Secondary | ICD-10-CM | POA: Diagnosis not present

## 2022-03-26 DIAGNOSIS — R300 Dysuria: Secondary | ICD-10-CM

## 2022-03-26 DIAGNOSIS — R3129 Other microscopic hematuria: Secondary | ICD-10-CM | POA: Diagnosis not present

## 2022-03-26 LAB — URINALYSIS, COMPLETE
Bilirubin, UA: NEGATIVE
Ketones, UA: NEGATIVE
Nitrite, UA: NEGATIVE
Protein,UA: NEGATIVE
Specific Gravity, UA: <1.005 — ABNORMAL LOW (ref 1.005–1.030)
Urobilinogen, Ur: 0.2 mg/dL (ref 0.2–1.0)
pH, UA: 5.5 (ref 5.0–7.5)

## 2022-03-26 LAB — MICROSCOPIC EXAMINATION: WBC, UA: >30 /hpf — AB (ref 0–5)

## 2022-03-26 LAB — BLADDER SCAN AMB NON-IMAGING

## 2022-03-26 MED ORDER — SULFAMETHOXAZOLE-TRIMETHOPRIM 800-160 MG PO TABS
1.0000 | ORAL_TABLET | Freq: Two times a day (BID) | ORAL | 0 refills | Status: DC
Start: 2022-03-26 — End: 2022-10-02

## 2022-03-26 NOTE — Telephone Encounter (Signed)
Please let Kyle Hood know that his Xray does show some constipation and he can take OTC stool softeners, but he still needs to follow up with his PCP.  He does have some pelvic calcifications on the left that are most likely phleboliths (small, round lumps of calcium that form in a person's veins), but there is small chance they may be a distal ureteral stone.  If he continues to have symptoms despite taking the antibiotic, we may need to pursue a CT scan for further evaluation.

## 2022-03-26 NOTE — Telephone Encounter (Signed)
No DPR on file, LMOM for pt to return call.  

## 2022-03-27 ENCOUNTER — Encounter: Payer: Self-pay | Admitting: Urology

## 2022-03-27 NOTE — Telephone Encounter (Signed)
Patient returned call and I relayed message from [place]. Patient verbalized understanding, and will call back if symptoms persist after taking antibiotic.

## 2022-03-28 ENCOUNTER — Telehealth: Payer: Self-pay

## 2022-03-28 LAB — CULTURE, URINE COMPREHENSIVE

## 2022-03-28 NOTE — Telephone Encounter (Signed)
-----   Message from Harle Battiest, PA-C sent at 03/28/2022  2:07 PM EST ----- Please let Mr. Hainline that his urine culture is positive for infection and that the Septra I prescribed is the appropriate antibiotic.  Hopefully he is feeling better.

## 2022-03-28 NOTE — Telephone Encounter (Signed)
No DPR on file, LMOM for pt to return call.

## 2022-04-01 ENCOUNTER — Encounter: Payer: Self-pay | Admitting: Psychiatry

## 2022-04-01 ENCOUNTER — Ambulatory Visit (INDEPENDENT_AMBULATORY_CARE_PROVIDER_SITE_OTHER): Payer: Medicare HMO | Admitting: Psychiatry

## 2022-04-01 VITALS — BP 148/72 | HR 92

## 2022-04-01 DIAGNOSIS — F410 Panic disorder [episodic paroxysmal anxiety] without agoraphobia: Secondary | ICD-10-CM | POA: Diagnosis not present

## 2022-04-01 DIAGNOSIS — F3342 Major depressive disorder, recurrent, in full remission: Secondary | ICD-10-CM

## 2022-04-01 DIAGNOSIS — F5101 Primary insomnia: Secondary | ICD-10-CM

## 2022-04-01 DIAGNOSIS — F4010 Social phobia, unspecified: Secondary | ICD-10-CM | POA: Diagnosis not present

## 2022-04-01 MED ORDER — HYDROXYZINE HCL 25 MG PO TABS: 25.0000 mg | ORAL_TABLET | Freq: Three times a day (TID) | ORAL | 1 refills | Status: DC | PRN

## 2022-04-01 NOTE — Progress Notes (Signed)
BH MD OP Progress Note  04/01/2022 1:36 PM Konner Saiz  MRN:  [id number]  Chief Complaint:  Chief Complaint  Patient presents with   Medication Refill   Follow-up   Depression   HPI: Kyle Hood is a 66 year old with history of MDD, panic disorder, social anxiety disorder, transgender male to male, lives in [place] was evaluated in office today.  Patient today reports overall mood symptoms continues to be stable.  However reports recent UTI, continues to have burning urination although improving.  Currently on Bactrim, has 2 more pills to complete the course.  Has been drinking plenty of water.  Reports currently off of the Viibryd, reports that definitely has helped with ' mental fog'.  Does have hydroxyzine available which he takes as needed.  Patient reports sleep is overall good.  Does have trazodone available which he takes as needed.  Patient denies side effects to medications.  Denies any suicidality, homicidality or perceptual disturbances.  Patient on multiple antihypertensive medications, with elevated blood pressure reading in session today.  Patient to monitor and follow up with primary care provider.  Visit Diagnosis:    ICD-10-CM   1. MDD (major depressive disorder), recurrent, in full remission (HCC)  F33.42 hydrOXYzine (ATARAX) 25 MG tablet    2. Social anxiety disorder  F40.10 hydrOXYzine (ATARAX) 25 MG tablet    3. Panic disorder  F41.0 hydrOXYzine (ATARAX) 25 MG tablet    4. Primary insomnia  F51.01 hydrOXYzine (ATARAX) 25 MG tablet      Past Psychiatric History: Reviewed past psychiatric history from progress note on 06/05/2017.  Past trials of Zoloft, Wellbutrin.  Past Medical History:  Past Medical History:  Diagnosis Date   Abnormal liver function test    Anxiety    Anxiety    Arthritis    Chronic lower back pain    Degenerative joint disease    Depression    Fatty liver    Heart murmur    Hepatitis B    Hyperlipidemia    Hypertension     Lumbar polyradiculopathy    Panic attacks    Polyneuropathy    Vertigo 05/03/2017   ER    Past Surgical History:  Procedure Laterality Date   COLONOSCOPY     COLONOSCOPY WITH PROPOFOL N/A 10/01/2017   Procedure: COLONOSCOPY WITH PROPOFOL;  Surgeon: Toledo, Boykin Nearing, MD;  Location: [facility] ENDOSCOPY;  Service: Endoscopy;  Laterality: N/A;   PROSTATE BIOPSY      Family Psychiatric History: Reviewed family psychiatric history from progress note on 06/05/2017.  Family History:  Family History  Problem Relation Age of Onset   Cancer Mother        Pancreatic   Diabetes Mother    Hypertension Mother    Varicose Veins Mother    Alcohol abuse Mother    Heart disease Father        CABG   Cancer Father        Jaw   Aortic aneurysm Father    Congestive Heart Failure Father    Hypertension Father    Varicose Veins Father    Alcohol abuse Father    Bipolar disorder Sister    Bipolar disorder Brother     Social History: Reviewed social history from progress note on 06/05/2017. Social History   Socioeconomic History   Marital status: Single    Spouse name: denise   Number of children: 0   Years of education: Not on file   Highest education level: 11th grade  Occupational History    Comment: disabled  Tobacco Use   Smoking status: Never   Smokeless tobacco: Never  Vaping Use   Vaping Use: Never used  Substance and Sexual Activity   Alcohol use: No   Drug use: Yes    Types: Marijuana   Sexual activity: Not Currently  Other Topics Concern   Not on file  Social History Narrative   Not on file   Social Determinants of Health   Financial Resource Strain: High Risk (03/19/2017)   Overall Financial Resource Strain (CARDIA)    Difficulty of Paying Living Expenses: Very hard  Food Insecurity: No Food Insecurity (03/19/2017)   Hunger Vital Sign    Worried About Running Out of Food in the Last Year: Never true    Ran Out of Food in the Last Year: Never true  Transportation  Needs: No Transportation Needs (03/19/2017)   PRAPARE - Administrator, Civil Service (Medical): No    Lack of Transportation (Non-Medical): No  Physical Activity: Inactive (03/19/2017)   Exercise Vital Sign    Days of Exercise per Week: 0 days    Minutes of Exercise per Session: 0 min  Stress: Not on file  Social Connections: Moderately Isolated (03/19/2017)   Social Connection and Isolation Panel [NHANES]    Frequency of Communication with Friends and Family: Twice a week    Frequency of Social Gatherings with Friends and Family: Never    Attends Religious Services: Never    Database administrator or Organizations: No    Attends Banker Meetings: Never    Marital Status: Living with partner    Allergies:  Allergies  Allergen Reactions   Lisinopril Cough    Metabolic Disorder Labs: Lab Results  Component Value Date   HGBA1C 6.1 08/16/2014   No results found for: "PROLACTIN" Lab Results  Component Value Date   CHOL 145 08/16/2014   TRIG 320 (A) 08/16/2014   HDL 36 08/16/2014   LDLCALC 45 08/16/2014   Lab Results  Component Value Date   TSH 1.61 08/16/2014    Therapeutic Level Labs: No results found for: "LITHIUM" No results found for: "VALPROATE" No results found for: "CBMZ"  Current Medications: Current Outpatient Medications  Medication Sig Dispense Refill   amLODipine (NORVASC) 5 MG tablet Take 5 mg by mouth daily.     glipiZIDE (GLUCOTROL XL) 5 MG 24 hr tablet Take by mouth.     hydrochlorothiazide (HYDRODIURIL) 25 MG tablet Take 25 mg by mouth daily.     naproxen (NAPROSYN) 500 MG tablet TK 1 T PO BID WC  3   pravastatin (PRAVACHOL) 20 MG tablet Take 20 mg by mouth daily.     spironolactone (ALDACTONE) 25 MG tablet      sulfamethoxazole-trimethoprim (BACTRIM DS) 800-160 MG tablet Take 1 tablet by mouth every 12 (twelve) hours. 14 tablet 0   tamsulosin (FLOMAX) 0.4 MG CAPS capsule TAKE 1 CAPSULE(0.4 MG) BY MOUTH DAILY 90 capsule 1    traZODone (DESYREL) 50 MG tablet Take 1 tablet (50 mg total) by mouth at bedtime as needed for sleep. Stop Quetiapine 90 tablet 0   valsartan (DIOVAN) 80 MG tablet Take by mouth.     hydrOXYzine (ATARAX) 25 MG tablet Take 1 tablet (25 mg total) by mouth every 8 (eight) hours as needed for anxiety. 90 tablet 1   No current facility-administered medications for this visit.     Musculoskeletal: Strength & Muscle Tone: within normal limits Gait &  Station: normal Patient leans: N/A  Psychiatric Specialty Exam: Review of Systems  Genitourinary:  Positive for dysuria.  Psychiatric/Behavioral: Negative.    All other systems reviewed and are negative.   Blood pressure (!) 148/72, pulse 92.There is no height or weight on file to calculate BMI.  General Appearance: Casual  Eye Contact:  Fair  Speech:  Clear and Coherent  Volume:  Normal  Mood:  Euthymic  Affect:  Congruent  Thought Process:  Goal Directed and Descriptions of Associations: Intact  Orientation:  Full (Time, Place, and Person)  Thought Content: Logical   Suicidal Thoughts:  No  Homicidal Thoughts:  No  Memory:  Immediate;   Fair Recent;   Fair Remote;   Fair  Judgement:  Fair  Insight:  Fair  Psychomotor Activity:  Normal  Concentration:  Concentration: Fair and Attention Span: Fair  Recall:  [employer] of Knowledge: Fair  Language: Fair  Akathisia:  No  Handed:  Right  AIMS (if indicated): not done  Assets:  Communication Skills Desire for Improvement Housing Intimacy Social Support  ADL's:  Intact  Cognition: WNL  Sleep:  Fair   Screenings: AIMS    Flowsheet Row Office Visit from 11/19/2021 in [facility] Office Visit from 08/23/2021 in [facility] Office Visit from 05/17/2021 in [facility]  AIMS Total Score 0 0 0      GAD-7    Flowsheet Row Office Visit from 04/01/2022 in [facility]  Office Visit from 11/19/2021 in [facility] Office Visit from 08/23/2021 in [facility] Office Visit from 05/17/2021 in [facility] Office Visit from 02/15/2021 in [facility]  Total GAD-7 Score 0 3 0 4 1      PHQ2-9    Flowsheet Row Office Visit from 04/01/2022 in [facility] Office Visit from 11/19/2021 in [facility] Office Visit from 08/23/2021 in [facility] Office Visit from 05/17/2021 in [facility] Office Visit from 02/15/2021 in [facility]  PHQ-2 Total Score 0 2 0 0 0  PHQ-9 Total Score -- 5 0 0 --      Flowsheet Row Office Visit from 04/01/2022 in [facility] Office Visit from 11/19/2021 in [facility] Office Visit from 05/17/2021 in [facility]  C-SSRS RISK CATEGORY No Risk Low Risk Low Risk        Assessment and Plan: Erastus Bartolomei is a 66 year old with history of depression, anxiety, currently off of the Viibryd, continues to do well, benefit from plan as noted below.  Plan Depression in remission Will monitor closely.  Social/panic disorder-stable Hydroxyzine 25 mg 3 times daily as needed for severe anxiety attacks only.  Insomnia-stable Continue trazodone 50 mg p.o. nightly as needed Continue sleep hygiene techniques  Follow-up in clinic in 6 months or sooner if needed.  Patient with elevated blood pressure reading in session today advised to follow-up with primary care provider.    This note was generated in part or whole with voice recognition software. Voice recognition is usually quite accurate but there are transcription errors that can and very often do occur. I apologize for any typographical errors that were not detected and  corrected.     Jomarie Longs, MD 04/01/2022, 1:36 PM

## 2022-05-02 NOTE — Progress Notes (Signed)
05/03/2022 10:59 AM   Kyle Hood February 22, 1956 [id number]  Referring provider: Orene Desanctis, MD [address] [place],  [place] [address]  Urological history: 1. Elevated PSA -PSA (06/2021) 0.8  -jumped to 3.81 (04/2021) from 1.0 (2020)  2. BPH w/LU TS -tamsulosin 0.4 mg daily    Chief Complaint  Patient presents with   Follow-up    HPI: Kyle Hood is a 67 y.o. transgender male to male who presents today for follow up.   At his visit on 03/26/2022, Since last Wednesday, he has had really short voids, stinging urination and a little bit of urine coming out.  His rectum is incredibly sore with really big and painful BM's, but he is now constipated and passing little small feces.  UA yellow cloudy, glucose 3+, specific gravity less than 1.005, 2+ blood, pH 5.5, 2+ leukocytes, greater than 30 WBCs, 3-10 RBCs, 0-10 epithelial cells and many bacteria.  PVR 22 mL.  Urine culture w/ e.coli.  treated with 7 days of culture appropriate antibiotic.  KUB constipation and pelvic phleboliths.  He states his symptoms have resolved after the completion of his antibiotic.  Patient denies any modifying or aggravating factors.  Patient denies any gross hematuria, dysuria or suprapubic/flank pain.  Patient denies any fevers, chills, nausea or vomiting.    Urinalysis is yellow clear, ketones 1+, specific gravity 1.015, pH 6.0, WBC 0-5, RBC 0-2 and epithelial cells 0-10.  PMH: Past Medical History:  Diagnosis Date   Abnormal liver function test    Anxiety    Anxiety    Arthritis    Chronic lower back pain    Degenerative joint disease    Depression    Fatty liver    Heart murmur    Hepatitis B    Hyperlipidemia    Hypertension    Lumbar polyradiculopathy    Panic attacks    Polyneuropathy    Vertigo 05/03/2017   ER    Surgical History: Past Surgical History:  Procedure Laterality Date   COLONOSCOPY     COLONOSCOPY WITH PROPOFOL N/A 10/01/2017   Procedure: COLONOSCOPY WITH  PROPOFOL;  Surgeon: Toledo, Boykin Nearing, MD;  Location: [facility] ENDOSCOPY;  Service: Endoscopy;  Laterality: N/A;   PROSTATE BIOPSY      Home Medications:  Allergies as of 05/03/2022       Reactions   Lisinopril Cough        Medication List        Accurate as of May 03, 2022 10:59 AM. If you have any questions, ask your nurse or doctor.          amLODipine 5 MG tablet Commonly known as: NORVASC Take 5 mg by mouth daily.   glipiZIDE 5 MG 24 hr tablet Commonly known as: GLUCOTROL XL Take by mouth.   hydrochlorothiazide 25 MG tablet Commonly known as: HYDRODIURIL Take 25 mg by mouth daily.   hydrOXYzine 25 MG tablet Commonly known as: ATARAX Take 1 tablet (25 mg total) by mouth every 8 (eight) hours as needed for anxiety.   naproxen 500 MG tablet Commonly known as: NAPROSYN TK 1 T PO BID WC   pravastatin 20 MG tablet Commonly known as: PRAVACHOL Take 20 mg by mouth daily.   spironolactone 25 MG tablet Commonly known as: ALDACTONE   sulfamethoxazole-trimethoprim 800-160 MG tablet Commonly known as: BACTRIM DS Take 1 tablet by mouth every 12 (twelve) hours.   tamsulosin 0.4 MG Caps capsule Commonly known as: FLOMAX TAKE 1 CAPSULE(0.4 MG)  BY MOUTH DAILY   traZODone 50 MG tablet Commonly known as: DESYREL Take 1 tablet (50 mg total) by mouth at bedtime as needed for sleep. Stop Quetiapine   valsartan 80 MG tablet Commonly known as: DIOVAN Take by mouth.        Allergies:  Allergies  Allergen Reactions   Lisinopril Cough    Family History: Family History  Problem Relation Age of Onset   Cancer Mother        Pancreatic   Diabetes Mother    Hypertension Mother    Varicose Veins Mother    Alcohol abuse Mother    Heart disease Father        CABG   Cancer Father        Jaw   Aortic aneurysm Father    Congestive Heart Failure Father    Hypertension Father    Varicose Veins Father    Alcohol abuse Father    Bipolar disorder Sister    Bipolar  disorder Brother     Social History:  reports that he has never smoked. He has never used smokeless tobacco. He reports current drug use. Drug: Marijuana. He reports that he does not drink alcohol.  ROS: Pertinent ROS in HPI  Physical Exam: BP 127/76 (BP Location: Left Arm, Patient Position: Sitting, Cuff Size: Large)   Pulse 90   Constitutional:  Well nourished. Alert and oriented, No acute distress. HEENT: [place] AT, moist mucus membranes.  Trachea midline Cardiovascular: No clubbing, cyanosis, or edema. Respiratory: Normal respiratory effort, no increased work of breathing. Neurologic: Grossly intact, no focal deficits, moving all 4 extremities. Psychiatric: Normal mood and affect.   Laboratory Data: Urinalysis See EPIC and HPI I have reviewed the labs.   Pertinent Imaging: N/A  Assessment & Plan:    1. Dysuria -Resolved  2. Microscopic hematuria -Resolved  3. Constipation -Resolved  4. BPH with LU TS -Will return first week of April for repeat PSA -Continue Flomax  Return for follow up the first week in April for PSA .  These notes generated with voice recognition software. I apologize for typographical errors.  [name]  [facility] [address]  [address] [place], [place] [address] [phone]

## 2022-05-03 ENCOUNTER — Other Ambulatory Visit: Payer: Self-pay

## 2022-05-03 ENCOUNTER — Ambulatory Visit (INDEPENDENT_AMBULATORY_CARE_PROVIDER_SITE_OTHER): Payer: Medicare HMO | Admitting: Urology

## 2022-05-03 ENCOUNTER — Encounter: Payer: Self-pay | Admitting: Urology

## 2022-05-03 VITALS — BP 127/76 | HR 90

## 2022-05-03 DIAGNOSIS — R350 Frequency of micturition: Secondary | ICD-10-CM | POA: Diagnosis not present

## 2022-05-03 DIAGNOSIS — R3129 Other microscopic hematuria: Secondary | ICD-10-CM

## 2022-05-03 DIAGNOSIS — N401 Enlarged prostate with lower urinary tract symptoms: Secondary | ICD-10-CM

## 2022-05-03 DIAGNOSIS — N390 Urinary tract infection, site not specified: Secondary | ICD-10-CM

## 2022-05-03 DIAGNOSIS — R972 Elevated prostate specific antigen [PSA]: Secondary | ICD-10-CM

## 2022-05-03 LAB — URINALYSIS, COMPLETE
Bilirubin, UA: NEGATIVE
Glucose, UA: NEGATIVE
Leukocytes,UA: NEGATIVE
Nitrite, UA: NEGATIVE
Protein,UA: NEGATIVE
RBC, UA: NEGATIVE
Specific Gravity, UA: 1.015 (ref 1.005–1.030)
Urobilinogen, Ur: 0.2 mg/dL (ref 0.2–1.0)
pH, UA: 6 (ref 5.0–7.5)

## 2022-05-03 LAB — MICROSCOPIC EXAMINATION: Bacteria, UA: NONE SEEN

## 2022-05-03 MED ORDER — TAMSULOSIN HCL 0.4 MG PO CAPS: ORAL_CAPSULE | ORAL | 3 refills | Status: DC

## 2022-05-14 ENCOUNTER — Telehealth: Payer: Self-pay | Admitting: Psychiatry

## 2022-05-14 NOTE — Telephone Encounter (Signed)
Patient left voice message 05-14-22 requesting a letter to excuse him from jury duty. Needs it by the first of the month.

## 2022-05-15 ENCOUNTER — Encounter: Payer: Self-pay | Admitting: Psychiatry

## 2022-05-15 NOTE — Telephone Encounter (Signed)
I have completed the jury duty letter, signed letter provided to Ms. Severiano Gilbert.

## 2022-06-16 ENCOUNTER — Other Ambulatory Visit: Payer: Self-pay

## 2022-06-16 ENCOUNTER — Emergency Department
Admission: EM | Admit: 2022-06-16 | Discharge: 2022-06-16 | Disposition: A | Discharge status: Home / Self Care | Payer: Medicare HMO | Attending: Emergency Medicine | Admitting: Emergency Medicine

## 2022-06-16 DIAGNOSIS — S39012A Strain of muscle, fascia and tendon of lower back, initial encounter: Secondary | ICD-10-CM | POA: Diagnosis not present

## 2022-06-16 DIAGNOSIS — I10 Essential (primary) hypertension: Secondary | ICD-10-CM | POA: Diagnosis not present

## 2022-06-16 DIAGNOSIS — X509XXA Other and unspecified overexertion or strenuous movements or postures, initial encounter: Secondary | ICD-10-CM | POA: Insufficient documentation

## 2022-06-16 DIAGNOSIS — E119 Type 2 diabetes mellitus without complications: Secondary | ICD-10-CM | POA: Insufficient documentation

## 2022-06-16 DIAGNOSIS — S3992XA Unspecified injury of lower back, initial encounter: Secondary | ICD-10-CM | POA: Diagnosis present

## 2022-06-16 MED ORDER — CYCLOBENZAPRINE HCL 5 MG PO TABS: 5.0000 mg | ORAL_TABLET | Freq: Three times a day (TID) | ORAL | 0 refills | Status: DC | PRN

## 2022-06-16 MED ORDER — LIDOCAINE 5 % EX PTCH
1.0000 | MEDICATED_PATCH | CUTANEOUS | Status: DC
  Administered 2022-06-16: 1 via TRANSDERMAL
  Filled 2022-06-16: qty 1

## 2022-06-16 MED ORDER — LIDOCAINE 5 % EX PTCH: 1.0000 | MEDICATED_PATCH | Freq: Two times a day (BID) | CUTANEOUS | 0 refills | Status: AC

## 2022-06-16 MED ORDER — KETOROLAC TROMETHAMINE 30 MG/ML IJ SOLN
30.0000 mg | Freq: Once | INTRAMUSCULAR | Status: AC
  Administered 2022-06-16: 30 mg via INTRAMUSCULAR
  Filled 2022-06-16: qty 1

## 2022-06-16 NOTE — ED Notes (Signed)
Patient c/o lower back pain that radiates to right hip. Patient states pain is 10/10. Patient c/o increased pain with movement. Patient denies urinary or bowel incontinence. Strength in both feet with push/pull was equal.

## 2022-06-16 NOTE — ED Triage Notes (Signed)
Pt to ED via POV from home. Pt reports 4 days ago he bent over and felt something pop and has been having pain since in his lower back. Pt reports decreased mobility and increased pain.

## 2022-06-16 NOTE — ED Provider Notes (Signed)
[facility] Provider Note    Event Date/Time   First MD Initiated Contact with Patient 06/16/22 1105     (approximate)   History   Chief Complaint Back Pain   HPI  Kyle Hood is a 67 y.o. adult with past medical history of hypertension, hyperlipidemia, diabetes, anxiety, and chronic back pain who presents to the ED complaining of back pain.  Patient reports that approximately 1 week ago he was bending over to pick something up when he felt a pop in his lower back.  Since then, he has been dealing with dull and aching pain in the right lower part of his back, worse when he tries to bend over or walk.  He denies any numbness or weakness in his lower extremities, has not had any groin numbness or difficulty urinating.  He denies any fevers, dysuria, hematuria, or flank pain.  He has been taking naproxen at home with partial relief.  He reports similar issues with his back in the past, but never this severe.     Physical Exam   Triage Vital Signs: ED Triage Vitals  Enc Vitals Group     BP 06/16/22 1053 (!) 169/71     Pulse Rate 06/16/22 1053 96     Resp 06/16/22 1053 18     Temp 06/16/22 1053 97.6 F (36.4 C)     Temp Source 06/16/22 1053 Oral     SpO2 06/16/22 1053 98 %     Weight --      Height --      Head Circumference --      Peak Flow --      Pain Score 06/16/22 1054 10     Pain Loc --      Pain Edu? --      Excl. in [place]? --     Most recent vital signs: Vitals:   06/16/22 1053 06/16/22 1218  BP: (!) 169/71 127/72  Pulse: 96 73  Resp: 18 18  Temp: 97.6 F (36.4 C) 98 F (36.7 C)  SpO2: 98% 98%    Constitutional: Alert and oriented. Eyes: Conjunctivae are normal. Head: Atraumatic. Nose: No congestion/rhinnorhea. Mouth/Throat: Mucous membranes are moist.  Cardiovascular: Normal rate, regular rhythm. Grossly normal heart sounds.  2+ radial and DP pulses bilaterally. Respiratory: Normal respiratory effort.  No retractions. Lungs  CTAB. Gastrointestinal: Soft and nontender.  No CVA tenderness bilaterally.  No distention. Musculoskeletal: No lower extremity tenderness nor edema.  Right lumbar paraspinal tenderness to palpation, no midline thoracic or lumbar spinal tenderness to palpation. Neurologic:  Normal speech and language. No gross focal neurologic deficits are appreciated.    ED Results / Procedures / Treatments   Labs (all labs ordered are listed, but only abnormal results are displayed) Labs Reviewed - No data to display   PROCEDURES:  Critical Care performed: No  Procedures   MEDICATIONS ORDERED IN ED: Medications  lidocaine (LIDODERM) 5 % 1 patch (1 patch Transdermal Patch Applied 06/16/22 1201)  ketorolac (TORADOL) 30 MG/ML injection 30 mg (30 mg Intramuscular Given 06/16/22 1158)     IMPRESSION / MDM / [place] / ED COURSE  I reviewed the triage vital signs and the nursing notes.                              67 y.o. adult with past medical history of hypertension, hyperlipidemia, diabetes, anxiety, and chronic back pain who presents  to the ED complaining of pain in the right lower part of his back since bending over about 1 week ago.  Patient's presentation is most consistent with acute, uncomplicated illness.  Differential diagnosis includes, but is not limited to, lumbar strain, lumbar radiculopathy, cauda equina.  Patient well-appearing and in no acute distress, vital signs are unremarkable.  He is neurovascularly intact to his bilateral lower extremities, no findings concerning for cauda equina.  Symptoms seem consistent with lumbar strain, no significant traumatic injury to necessitate imaging.  We will treat symptomatically with IM Toradol and Lidoderm patch.  Pain improving following Toradol and Lidoderm patch.  Patient is appropriate for discharge home with PCP follow-up, will be prescribed Lidoderm patches and Flexeril to use as needed in addition to Tylenol and ibuprofen.   He was counseled to follow-up with his PCP and to return to the ED for new or worsening symptoms, patient agrees with plan.      FINAL CLINICAL IMPRESSION(S) / ED DIAGNOSES   Final diagnoses:  Strain of lumbar region, initial encounter     Rx / DC Orders   ED Discharge Orders          Ordered    lidocaine (LIDODERM) 5 %  Every 12 hours        06/16/22 1241    cyclobenzaprine (FLEXERIL) 5 MG tablet  3 times daily PRN        06/16/22 1241             Note:  This document was prepared using Dragon voice recognition software and may include unintentional dictation errors.   Blake Divine, MD 06/16/22 1242

## 2022-08-01 ENCOUNTER — Other Ambulatory Visit: Payer: Medicare HMO

## 2022-08-01 DIAGNOSIS — R972 Elevated prostate specific antigen [PSA]: Secondary | ICD-10-CM

## 2022-08-02 ENCOUNTER — Telehealth: Payer: Self-pay | Admitting: Family Medicine

## 2022-08-02 LAB — PSA: Prostate Specific Ag, Serum: 1.9 ng/mL (ref 0.0–4.0)

## 2022-08-02 NOTE — Telephone Encounter (Signed)
[facility] informed patient of normal result and to call our office back to schedule a 1 year follow up.

## 2022-08-02 NOTE — Telephone Encounter (Signed)
-----   Message from Harle Battiest, PA-C sent at 08/02/2022  8:36 AM EDT ----- Please let [name] that his PSA was normal and we need to see him in one year.

## 2022-08-08 NOTE — Telephone Encounter (Signed)
Pt returned call and I scheduled 1 yr follow up.

## 2022-09-11 ENCOUNTER — Other Ambulatory Visit: Payer: Self-pay

## 2022-10-02 ENCOUNTER — Telehealth: Payer: Self-pay

## 2022-10-02 ENCOUNTER — Encounter: Payer: Self-pay | Admitting: Psychiatry

## 2022-10-02 ENCOUNTER — Ambulatory Visit (INDEPENDENT_AMBULATORY_CARE_PROVIDER_SITE_OTHER): Payer: Medicare HMO | Admitting: Psychiatry

## 2022-10-02 VITALS — BP 138/68 | HR 87 | Temp 97.8°F | Ht 74.0 in | Wt 275.8 lb

## 2022-10-02 DIAGNOSIS — F410 Panic disorder [episodic paroxysmal anxiety] without agoraphobia: Secondary | ICD-10-CM

## 2022-10-02 DIAGNOSIS — F3342 Major depressive disorder, recurrent, in full remission: Secondary | ICD-10-CM | POA: Diagnosis not present

## 2022-10-02 DIAGNOSIS — F5101 Primary insomnia: Secondary | ICD-10-CM | POA: Diagnosis not present

## 2022-10-02 DIAGNOSIS — F4010 Social phobia, unspecified: Secondary | ICD-10-CM | POA: Diagnosis not present

## 2022-10-02 MED ORDER — HYDROXYZINE HCL 25 MG PO TABS: 25.0000 mg | ORAL_TABLET | Freq: Three times a day (TID) | ORAL | 1 refills | Status: DC | PRN

## 2022-10-02 NOTE — Telephone Encounter (Signed)
Disregard previous note continue PA to see what would happen answered all questions PA was approved Coverage 10/02/22----04/29/23 Patient notified

## 2022-10-02 NOTE — Progress Notes (Unsigned)
BH MD OP Progress Note  10/02/2022 12:48 PM Kyle Hood  MRN:  [id number]  Chief Complaint:  Chief Complaint  Patient presents with   Follow-up   Anxiety   Medication Refill   HPI: Kyle Hood is a 67 year old transgendered male to male, lives in [place] with a partner, has a history of MDD, panic disorder, social anxiety disorder, on disability, was evaluated in office today.  Patient today appeared to be anxious, reports he is worried about his partner going to undergo hernia surgery tomorrow.  Reports lately he has been coming up with worst case scenarios that could happen during the procedure.  However reports hydroxyzine is beneficial and has been using it as needed.  Not interested in getting back on a psychotropic medication including Viibryd which he tried in the past.  Does not believe he needs it at this time.  Reports sleep is overall good.  Reports appetite is fair, has been watching diet, making healthier choices.  Denies any suicidality, homicidality or perceptual disturbances.  Continues to do activities like painting, reading that has been beneficial and keeps him occupied.  Patient denies any other concerns today.  Visit Diagnosis:    ICD-10-CM   1. MDD (major depressive disorder), recurrent, in full remission (HCC)  F33.42 hydrOXYzine (ATARAX) 25 MG tablet    2. Social anxiety disorder  F40.10 hydrOXYzine (ATARAX) 25 MG tablet    3. Panic disorder  F41.0 hydrOXYzine (ATARAX) 25 MG tablet    4. Primary insomnia  F51.01 hydrOXYzine (ATARAX) 25 MG tablet      Past Psychiatric History: I have reviewed past psychiatric history from progress note on 06/05/2017.  Past trials of Zoloft, Wellbutrin.  Past Medical History:  Past Medical History:  Diagnosis Date   Abnormal liver function test    Anxiety    Anxiety    Arthritis    Chronic lower back pain    Degenerative joint disease    Depression    Fatty liver    Heart murmur    Hepatitis B     Hyperlipidemia    Hypertension    Lumbar polyradiculopathy    Panic attacks    Polyneuropathy    Vertigo 05/03/2017   ER    Past Surgical History:  Procedure Laterality Date   COLONOSCOPY     COLONOSCOPY WITH PROPOFOL N/A 10/01/2017   Procedure: COLONOSCOPY WITH PROPOFOL;  Surgeon: Toledo, Boykin Nearing, MD;  Location: [facility] ENDOSCOPY;  Service: Endoscopy;  Laterality: N/A;   PROSTATE BIOPSY      Family Psychiatric History: I have reviewed family psychiatric history from progress note on 06/05/2017.  Family History:  Family History  Problem Relation Age of Onset   Cancer Mother        Pancreatic   Diabetes Mother    Hypertension Mother    Varicose Veins Mother    Alcohol abuse Mother    Heart disease Father        CABG   Cancer Father        Jaw   Aortic aneurysm Father    Congestive Heart Failure Father    Hypertension Father    Varicose Veins Father    Alcohol abuse Father    Bipolar disorder Sister    Bipolar disorder Brother     Social History: I have reviewed social history from progress note on 06/05/2017. Social History   Socioeconomic History   Marital status: Single    Spouse name: denise   Number of children: 0  Years of education: Not on file   Highest education level: 11th grade  Occupational History    Comment: disabled  Tobacco Use   Smoking status: Never   Smokeless tobacco: Never  Vaping Use   Vaping Use: Never used  Substance and Sexual Activity   Alcohol use: No   Drug use: Yes    Types: Marijuana   Sexual activity: Not Currently  Other Topics Concern   Not on file  Social History Narrative   Not on file   Social Determinants of Health   Financial Resource Strain: High Risk (03/19/2017)   Overall Financial Resource Strain (CARDIA)    Difficulty of Paying Living Expenses: Very hard  Food Insecurity: No Food Insecurity (03/19/2017)   Hunger Vital Sign    Worried About Running Out of Food in the Last Year: Never true    Ran Out of Food in  the Last Year: Never true  Transportation Needs: No Transportation Needs (03/19/2017)   PRAPARE - Administrator, Civil Service (Medical): No    Lack of Transportation (Non-Medical): No  Physical Activity: Inactive (03/19/2017)   Exercise Vital Sign    Days of Exercise per Week: 0 days    Minutes of Exercise per Session: 0 min  Stress: Not on file  Social Connections: Moderately Isolated (03/19/2017)   Social Connection and Isolation Panel [NHANES]    Frequency of Communication with Friends and Family: Twice a week    Frequency of Social Gatherings with Friends and Family: Never    Attends Religious Services: Never    Database administrator or Organizations: No    Attends Banker Meetings: Never    Marital Status: Living with partner    Allergies:  Allergies  Allergen Reactions   Lisinopril Cough    Metabolic Disorder Labs: Lab Results  Component Value Date   HGBA1C 6.1 08/16/2014   No results found for: "PROLACTIN" Lab Results  Component Value Date   CHOL 145 08/16/2014   TRIG 320 (A) 08/16/2014   HDL 36 08/16/2014   LDLCALC 45 08/16/2014   Lab Results  Component Value Date   TSH 1.61 08/16/2014    Therapeutic Level Labs: No results found for: "LITHIUM" No results found for: "VALPROATE" No results found for: "CBMZ"  Current Medications: Current Outpatient Medications  Medication Sig Dispense Refill   amLODipine (NORVASC) 5 MG tablet Take 5 mg by mouth daily.     clotrimazole-betamethasone (LOTRISONE) cream Apply topically 2 (two) times daily.     Continuous Glucose Sensor (FREESTYLE LIBRE 3 SENSOR) MISC USE 1 SENSOR EVERY 14 DAYS     cyclobenzaprine (FLEXERIL) 5 MG tablet Take 1 tablet (5 mg total) by mouth 3 (three) times daily as needed. 12 tablet 0   glipiZIDE (GLUCOTROL XL) 5 MG 24 hr tablet Take by mouth.     hydrochlorothiazide (HYDRODIURIL) 25 MG tablet Take 25 mg by mouth daily.     lidocaine (LIDODERM) 5 % Place 1 patch onto  the skin every 12 (twelve) hours. Remove & Discard patch within 12 hours or as directed by MD 10 patch 0   naproxen (NAPROSYN) 500 MG tablet TK 1 T PO BID WC  3   pravastatin (PRAVACHOL) 20 MG tablet Take 20 mg by mouth daily.     spironolactone (ALDACTONE) 25 MG tablet      tamsulosin (FLOMAX) 0.4 MG CAPS capsule TAKE 1 CAPSULE(0.4 MG) BY MOUTH DAILY 90 capsule 3   TRADJENTA 5 MG TABS  tablet Take 5 mg by mouth daily.     traZODone (DESYREL) 50 MG tablet Take 1 tablet (50 mg total) by mouth at bedtime as needed for sleep. Stop Quetiapine 90 tablet 0   valsartan (DIOVAN) 80 MG tablet Take by mouth.     hydrOXYzine (ATARAX) 25 MG tablet Take 1 tablet (25 mg total) by mouth every 8 (eight) hours as needed for anxiety. 90 tablet 1   No current facility-administered medications for this visit.     Musculoskeletal: Strength & Muscle Tone: within normal limits Gait & Station: normal Patient leans: N/A  Psychiatric Specialty Exam: Review of Systems  Psychiatric/Behavioral:  The patient is nervous/anxious.     Blood pressure 138/68, pulse 87, temperature 97.8 F (36.6 C), temperature source Skin, height 6\' 2"  (1.88 m), weight 275 lb 12.8 oz (125.1 kg).Body mass index is 35.41 kg/m.  General Appearance: Casual  Eye Contact:  Fair  Speech:  Clear and Coherent  Volume:  Normal  Mood:  Anxious  Affect:  Congruent  Thought Process:  Goal Directed and Descriptions of Associations: Intact  Orientation:  Full (Time, Place, and Person)  Thought Content: Logical   Suicidal Thoughts:  No  Homicidal Thoughts:  No  Memory:  Immediate;   Fair Recent;   Fair Remote;   Fair  Judgement:  Fair  Insight:  Fair  Psychomotor Activity:  Normal  Concentration:  Concentration: Fair and Attention Span: Fair  Recall:  [employer] of Knowledge: Fair  Language: Fair  Akathisia:  No  Handed:  Right  AIMS (if indicated): not done  Assets:  Communication Skills Desire for Improvement Housing Social  Support  ADL's:  Intact  Cognition: WNL  Sleep:  Fair   Screenings: Midwife Visit from 11/19/2021 in [facility] Office Visit from 08/23/2021 in [facility] Office Visit from 05/17/2021 in [facility]  AIMS Total Score 0 0 0      GAD-7    Flowsheet Row Office Visit from 10/02/2022 in [facility] Office Visit from 04/01/2022 in [facility] Office Visit from 11/19/2021 in [facility] Office Visit from 08/23/2021 in [facility] Office Visit from 05/17/2021 in [facility]  Total GAD-7 Score 2 0 3 0 4      PHQ2-9    Flowsheet Row Office Visit from 10/02/2022 in [facility] Office Visit from 04/01/2022 in [facility] Office Visit from 11/19/2021 in [facility] Office Visit from 08/23/2021 in [facility] Office Visit from 05/17/2021 in [facility]  PHQ-2 Total Score 0 0 2 0 0  PHQ-9 Total Score -- -- 5 0 0      Flowsheet Row Office Visit from 10/02/2022 in [facility] ED from 06/16/2022 in [facility] Department at [facility] Visit from 04/01/2022 in [facility]  C-SSRS RISK CATEGORY Low Risk No Risk No Risk        Assessment and Plan: Ameir Gaarder is a 67 year old with history of depression, anxiety, currently with situational anxiety although reports managing okay, will benefit from the following plan.  Plan Depression in remission Will monitor  closely  Social/panic disorder-stable  Continue hydroxyzine 25 mg p.o. 3 times daily as needed for anxiety attacks. Discussed referral for CBT given the current situational stressors, patient is not interested.  Insomnia-stable Trazodone 50 mg p.o. nightly as needed-patient has not been using it although available. Continue sleep hygiene techniques.  Follow-up in clinic in 3 months or sooner if needed.  Consent: Patient/Guardian gives verbal consent for treatment and assignment of benefits for services provided during this visit. Patient/Guardian expressed understanding and agreed to proceed.   This note was generated in part or whole with voice recognition software. Voice recognition is usually quite accurate but there are transcription errors that can and very often do occur. I apologize for any typographical errors that were not detected and corrected.    Kyle Longs, MD 10/02/2022, 12:48 PM

## 2022-10-02 NOTE — Telephone Encounter (Signed)
Received a fax to initiate a PA for the hydroxyzine 25 mg due to patient not having a failure of at least two of the following medications buspirone, duloxetine, escitalopram, sertraline, or venlafaxine extended release the PA will most likely be denied called patient to ask if he would be ok to try something different no answer left voicemail for patient to return call to office

## 2022-10-03 ENCOUNTER — Telehealth: Payer: Self-pay

## 2022-10-03 NOTE — Telephone Encounter (Signed)
received fax that hydroxyzine was approved from 04-29-22 to 04-29-23

## 2023-01-08 ENCOUNTER — Ambulatory Visit (INDEPENDENT_AMBULATORY_CARE_PROVIDER_SITE_OTHER): Payer: Medicare HMO | Admitting: Psychiatry

## 2023-01-08 ENCOUNTER — Encounter: Payer: Self-pay | Admitting: Psychiatry

## 2023-01-08 VITALS — BP 143/82 | HR 84 | Temp 98.0°F | Ht 74.0 in | Wt 273.8 lb

## 2023-01-08 DIAGNOSIS — F3342 Major depressive disorder, recurrent, in full remission: Secondary | ICD-10-CM | POA: Diagnosis not present

## 2023-01-08 DIAGNOSIS — F4010 Social phobia, unspecified: Secondary | ICD-10-CM | POA: Diagnosis not present

## 2023-01-08 DIAGNOSIS — F5101 Primary insomnia: Secondary | ICD-10-CM

## 2023-01-08 DIAGNOSIS — F410 Panic disorder [episodic paroxysmal anxiety] without agoraphobia: Secondary | ICD-10-CM

## 2023-01-08 MED ORDER — TRAZODONE HCL 50 MG PO TABS: 50.0000 mg | ORAL_TABLET | Freq: Every evening | ORAL | 1 refills | Status: DC | PRN

## 2023-01-08 NOTE — Progress Notes (Unsigned)
BH MD OP Progress Note  01/08/2023 11:16 AM Kyle Hood  MRN:  [id number]  Chief Complaint:  Chief Complaint  Patient presents with   Follow-up   Depression   Anxiety   Medication Refill   HPI: Kyle Hood is a 67 year old transgendered male to male lives in [place] with partner, has a history of MDD, panic disorder, social anxiety disorder, disability was evaluated in office today.  Patient today reports he does have situational anxiety.  Patient reports he may not have a car since his car is giving him a lot of trouble.  That does worry him.  Patient does worry about transportation to get to grocery stores, appointments.  Agreeable to reach out to [employer] for transportation assistance.  Patient reports he does use hydroxyzine as needed and that helps.  Reports sleep is overall good.  Trazodone is available as needed and that helps when he takes it.  Patient reports appetite is fair.  Denies any suicidality, homicidality or perceptual disturbances.  Patient denies any significant depression symptoms.  Denies any other concerns today.  Visit Diagnosis:    ICD-10-CM   1. MDD (major depressive disorder), recurrent, in full remission (HCC)  F33.42     2. Social anxiety disorder  F40.10     3. Panic disorder  F41.0     4. Primary insomnia  F51.01       Past Psychiatric History: I have reviewed past psychiatric history from progress note on 06/05/2017.  Past trials of Zoloft, Wellbutrin.  Past Medical History:  Past Medical History:  Diagnosis Date   Abnormal liver function test    Anxiety    Anxiety    Arthritis    Chronic lower back pain    Degenerative joint disease    Depression    Fatty liver    Heart murmur    Hepatitis B    Hyperlipidemia    Hypertension    Lumbar polyradiculopathy    Panic attacks    Polyneuropathy    Vertigo 05/03/2017   ER    Past Surgical History:  Procedure Laterality Date   COLONOSCOPY     COLONOSCOPY WITH PROPOFOL N/A  10/01/2017   Procedure: COLONOSCOPY WITH PROPOFOL;  Surgeon: Toledo, Boykin Nearing, MD;  Location: [facility] ENDOSCOPY;  Service: Endoscopy;  Laterality: N/A;   PROSTATE BIOPSY      Family Psychiatric History: I have reviewed family psychiatric history from progress note on 06/05/2017.  Family History:  Family History  Problem Relation Age of Onset   Cancer Mother        Pancreatic   Diabetes Mother    Hypertension Mother    Varicose Veins Mother    Alcohol abuse Mother    Heart disease Father        CABG   Cancer Father        Jaw   Aortic aneurysm Father    Congestive Heart Failure Father    Hypertension Father    Varicose Veins Father    Alcohol abuse Father    Bipolar disorder Sister    Bipolar disorder Brother     Social History: I have reviewed social history from progress note on 06/05/2017. Social History   Socioeconomic History   Marital status: Single    Spouse name: denise   Number of children: 0   Years of education: Not on file   Highest education level: 11th grade  Occupational History    Comment: disabled  Tobacco Use   Smoking status: Never  Smokeless tobacco: Never  Vaping Use   Vaping status: Never Used  Substance and Sexual Activity   Alcohol use: No   Drug use: Yes    Types: Marijuana   Sexual activity: Not Currently  Other Topics Concern   Not on file  Social History Narrative   Not on file   Social Determinants of Health   Financial Resource Strain: Patient Declined (11/15/2022)   Received from [facility]   Overall Financial Resource Strain (CARDIA)    Difficulty of Paying Living Expenses: Patient declined  Food Insecurity: Patient Declined (11/15/2022)   [facility]   Hunger Vital Sign    Worried About Running Out of Food in the Last Year: Patient declined    Ran Out of Food in the Last Year: Patient declined  Transportation Needs: Patient Declined (11/15/2022)   Received from [facility]    In the past 12 months, has lack of transportation kept you from medical appointments or from getting medications?: Patient declined    Lack of Transportation (Non-Medical): Patient declined  Physical Activity: Inactive (03/19/2017)   Exercise Vital Sign    Days of Exercise per Week: 0 days    Minutes of Exercise per Session: 0 min  Stress: Not on file  Social Connections: Moderately Isolated (03/19/2017)   Social Connection and Isolation Panel [NHANES]    Frequency of Communication with Friends and Family: Twice a week    Frequency of Social Gatherings with Friends and Family: Never    Attends Religious Services: Never    Database administrator or Organizations: No    Attends Banker Meetings: Never    Marital Status: Living with partner    Allergies:  Allergies  Allergen Reactions   Lisinopril Cough    Metabolic Disorder Labs: Lab Results  Component Value Date   HGBA1C 6.1 08/16/2014   No results found for: "PROLACTIN" Lab Results  Component Value Date   CHOL 145 08/16/2014   TRIG 320 (A) 08/16/2014   HDL 36 08/16/2014   LDLCALC 45 08/16/2014   Lab Results  Component Value Date   TSH 1.61 08/16/2014    Therapeutic Level Labs: No results found for: "LITHIUM" No results found for: "VALPROATE" No results found for: "CBMZ"  Current Medications: Current Outpatient Medications  Medication Sig Dispense Refill   amLODipine (NORVASC) 5 MG tablet Take 5 mg by mouth daily.     clotrimazole-betamethasone (LOTRISONE) cream Apply topically 2 (two) times daily.     Continuous Glucose Sensor (FREESTYLE LIBRE 3 SENSOR) MISC USE 1 SENSOR EVERY 14 DAYS     cyclobenzaprine (FLEXERIL) 5 MG tablet Take 1 tablet (5 mg total) by mouth 3 (three) times daily as needed. 12 tablet 0   glipiZIDE (GLUCOTROL XL) 5 MG 24 hr tablet Take by mouth.     hydrochlorothiazide (HYDRODIURIL) 25 MG tablet Take 25 mg by mouth daily.      hydrOXYzine (ATARAX) 25 MG tablet Take 1 tablet (25 mg total) by mouth every 8 (eight) hours as needed for anxiety. 90 tablet 1   lidocaine (LIDODERM) 5 % Place 1 patch onto the skin every 12 (twelve) hours. Remove & Discard patch within 12 hours or as directed by MD 10 patch 0   naproxen (NAPROSYN) 500 MG tablet TK 1 T PO BID WC  3   pravastatin (PRAVACHOL) 20 MG tablet Take 20 mg by mouth daily.  spironolactone (ALDACTONE) 25 MG tablet      tamsulosin (FLOMAX) 0.4 MG CAPS capsule TAKE 1 CAPSULE(0.4 MG) BY MOUTH DAILY 90 capsule 3   TRADJENTA 5 MG TABS tablet Take 5 mg by mouth daily.     traZODone (DESYREL) 50 MG tablet Take 1 tablet (50 mg total) by mouth at bedtime as needed for sleep. Stop Quetiapine 90 tablet 0   valsartan (DIOVAN) 80 MG tablet Take by mouth.     No current facility-administered medications for this visit.     Musculoskeletal: Strength & Muscle Tone: within normal limits Gait & Station: normal Patient leans: N/A  Psychiatric Specialty Exam: Review of Systems  Musculoskeletal:  Positive for arthralgias.  Psychiatric/Behavioral:  The patient is nervous/anxious.     Blood pressure (!) 143/82, pulse 84, temperature 98 F (36.7 C), temperature source Skin, height 6\' 2"  (1.88 m), weight 273 lb 12.8 oz (124.2 kg).Body mass index is 35.15 kg/m.  General Appearance: Fairly Groomed  Eye Contact:  Fair  Speech:  Clear and Coherent  Volume:  Normal  Mood:  Anxious  Affect:  Congruent  Thought Process:  Goal Directed and Descriptions of Associations: Intact  Orientation:  Full (Time, Place, and Person)  Thought Content: Logical   Suicidal Thoughts:  No  Homicidal Thoughts:  No  Memory:  Immediate;   Fair Recent;   Fair Remote;   Fair  Judgement:  Fair  Insight:  Fair  Psychomotor Activity:  Normal  Concentration:  Concentration: Fair and Attention Span: Fair  Recall:  [employer] of Knowledge: Fair  Language: Fair  Akathisia:  No  Handed:  Right  AIMS (if  indicated): not done  Assets:  Communication Skills Desire for Improvement Housing Social Support  ADL's:  Intact  Cognition: WNL  Sleep:  Fair   Screenings: Midwife Visit from 11/19/2021 in [facility] Office Visit from 08/23/2021 in [facility] Office Visit from 05/17/2021 in [facility]  AIMS Total Score 0 0 0      GAD-7    Flowsheet Row Office Visit from 10/02/2022 in [facility] Office Visit from 04/01/2022 in [facility] Office Visit from 11/19/2021 in [facility] Office Visit from 08/23/2021 in [facility] Office Visit from 05/17/2021 in [facility]  Total GAD-7 Score 2 0 3 0 4      PHQ2-9    Flowsheet Row Office Visit from 10/02/2022 in [facility] Office Visit from 04/01/2022 in [facility] Office Visit from 11/19/2021 in [facility] Office Visit from 08/23/2021 in [facility] Office Visit from 05/17/2021 in [facility]  PHQ-2 Total Score 0 0 2 0 0  PHQ-9 Total Score -- -- 5 0 0      Flowsheet Row Office Visit from 10/02/2022 in [facility] ED from 06/16/2022 in [facility] Department at [facility] Visit from 04/01/2022 in [facility]  C-SSRS RISK CATEGORY Low Risk No Risk No Risk        Assessment and Plan: Kyle Hood is a 67 year old male to male transgender, has a history of anxiety, depression, currently stable on current  medication regimen.  Plan as noted below.  Plan Depression in remission Continue trazodone 50 g p.o. nightly as needed   Social/panic disorder-stable Continue hydroxyzine 25 mg p.o. 3 times daily as needed  Insomnia-stable Trazodone 50 mg p.o. nightly as needed Continue sleep hygiene techniques  Follow-up in clinic in 6 months or sooner if needed.   Collaboration of Care: Collaboration of Care: Other patient encouraged to reach out to [employer] for transportation.  Provided resources.  Patient/Guardian was advised Release of Information must be obtained prior to any record release in order to collaborate their care with an outside provider. Patient/Guardian was advised if they have not already done so to contact the registration department to sign all necessary forms in order for [place] to release information regarding their care.   Consent: Patient/Guardian gives verbal consent for treatment and assignment of benefits for services provided during this visit. Patient/Guardian expressed understanding and agreed to proceed.   This note was generated in part or whole with voice recognition software. Voice recognition is usually quite accurate but there are transcription errors that can and very often do occur. I apologize for any typographical errors that were not detected and corrected.    Jomarie Longs, MD 01/08/2023, 11:16 AM

## 2023-02-15 DIAGNOSIS — M545 Low back pain, unspecified: Secondary | ICD-10-CM | POA: Insufficient documentation

## 2023-02-15 DIAGNOSIS — Z5321 Procedure and treatment not carried out due to patient leaving prior to being seen by health care provider: Secondary | ICD-10-CM | POA: Insufficient documentation

## 2023-02-16 ENCOUNTER — Other Ambulatory Visit: Payer: Self-pay

## 2023-02-16 ENCOUNTER — Encounter: Payer: Self-pay | Admitting: Emergency Medicine

## 2023-02-16 ENCOUNTER — Emergency Department: Payer: Medicare HMO

## 2023-02-16 ENCOUNTER — Emergency Department
Admission: EM | Admit: 2023-02-16 | Discharge: 2023-02-16 | Discharge status: Against Medical Advice | Payer: Medicare HMO | Attending: Emergency Medicine | Admitting: Emergency Medicine

## 2023-02-16 DIAGNOSIS — M545 Low back pain, unspecified: Secondary | ICD-10-CM | POA: Diagnosis not present

## 2023-02-16 LAB — BASIC METABOLIC PANEL
Anion gap: 11 (ref 5–15)
BUN: 20 mg/dL (ref 8–23)
CO2: 22 mmol/L (ref 22–32)
Calcium: 9.2 mg/dL (ref 8.9–10.3)
Chloride: 98 mmol/L (ref 98–111)
Creatinine, Ser: 0.99 mg/dL (ref 0.61–1.24)
GFR, Estimated: >60 mL/min (ref >60)
Glucose, Bld: 123 mg/dL — ABNORMAL HIGH (ref 70–99)
Potassium: 4.3 mmol/L (ref 3.5–5.1)
Sodium: 131 mmol/L — ABNORMAL LOW (ref 135–145)

## 2023-02-16 LAB — CBC
HCT: 42 % (ref 39.0–52.0)
Hemoglobin: 14.1 g/dL (ref 13.0–17.0)
MCH: 31 pg (ref 26.0–34.0)
MCHC: 33.6 g/dL (ref 30.0–36.0)
MCV: 92.3 fL (ref 80.0–100.0)
Platelets: 178 10*3/uL (ref 150–400)
RBC: 4.55 MIL/uL (ref 4.22–5.81)
RDW: 13 % (ref 11.5–15.5)
WBC: 10.7 10*3/uL — ABNORMAL HIGH (ref 4.0–10.5)
nRBC: 0.2 % (ref 0.0–0.2)

## 2023-02-16 LAB — TROPONIN I (HIGH SENSITIVITY): Troponin I (High Sensitivity): 7 ng/L (ref <18)

## 2023-02-16 NOTE — ED Triage Notes (Addendum)
Pt in with lumbar pain that radiates to his abdomen and between shoulders - described as "spasm pain". Pt states on 10/17 he was was standing in line to vote for 2hrs, thinks this is what triggered the pain. Hx of lumbar DDD. Ambulatory into triage

## 2023-02-17 ENCOUNTER — Encounter: Payer: Self-pay | Admitting: Emergency Medicine

## 2023-02-17 ENCOUNTER — Other Ambulatory Visit: Payer: Self-pay

## 2023-02-17 ENCOUNTER — Emergency Department
Admission: EM | Admit: 2023-02-17 | Discharge: 2023-02-17 | Disposition: A | Discharge status: Home / Self Care | Payer: Medicare HMO | Attending: Emergency Medicine | Admitting: Emergency Medicine

## 2023-02-17 ENCOUNTER — Ambulatory Visit: Payer: Medicare HMO | Admitting: Urology

## 2023-02-17 ENCOUNTER — Other Ambulatory Visit
Admission: RE | Admit: 2023-02-17 | Discharge: 2023-02-17 | Disposition: A | Discharge status: Home / Self Care | Payer: Medicare HMO | Source: Home / Self Care | Attending: Urology | Admitting: Urology

## 2023-02-17 ENCOUNTER — Encounter: Payer: Self-pay | Admitting: Urology

## 2023-02-17 ENCOUNTER — Emergency Department: Payer: Medicare HMO

## 2023-02-17 VITALS — BP 162/81 | HR 99 | Temp 97.7°F | Ht 74.0 in | Wt 267.6 lb

## 2023-02-17 DIAGNOSIS — E871 Hypo-osmolality and hyponatremia: Secondary | ICD-10-CM | POA: Diagnosis not present

## 2023-02-17 DIAGNOSIS — R300 Dysuria: Secondary | ICD-10-CM | POA: Insufficient documentation

## 2023-02-17 DIAGNOSIS — N401 Enlarged prostate with lower urinary tract symptoms: Secondary | ICD-10-CM

## 2023-02-17 DIAGNOSIS — K921 Melena: Secondary | ICD-10-CM | POA: Diagnosis not present

## 2023-02-17 DIAGNOSIS — K529 Noninfective gastroenteritis and colitis, unspecified: Secondary | ICD-10-CM | POA: Insufficient documentation

## 2023-02-17 DIAGNOSIS — F428 Other obsessive-compulsive disorder: Secondary | ICD-10-CM | POA: Insufficient documentation

## 2023-02-17 DIAGNOSIS — K449 Diaphragmatic hernia without obstruction or gangrene: Secondary | ICD-10-CM | POA: Insufficient documentation

## 2023-02-17 DIAGNOSIS — R1010 Upper abdominal pain, unspecified: Secondary | ICD-10-CM | POA: Diagnosis not present

## 2023-02-17 DIAGNOSIS — R590 Localized enlarged lymph nodes: Secondary | ICD-10-CM | POA: Diagnosis present

## 2023-02-17 DIAGNOSIS — M545 Low back pain, unspecified: Secondary | ICD-10-CM | POA: Insufficient documentation

## 2023-02-17 DIAGNOSIS — R1084 Generalized abdominal pain: Secondary | ICD-10-CM | POA: Insufficient documentation

## 2023-02-17 DIAGNOSIS — G629 Polyneuropathy, unspecified: Secondary | ICD-10-CM | POA: Diagnosis not present

## 2023-02-17 DIAGNOSIS — K760 Fatty (change of) liver, not elsewhere classified: Secondary | ICD-10-CM | POA: Insufficient documentation

## 2023-02-17 DIAGNOSIS — M5416 Radiculopathy, lumbar region: Secondary | ICD-10-CM | POA: Diagnosis not present

## 2023-02-17 DIAGNOSIS — N400 Enlarged prostate without lower urinary tract symptoms: Secondary | ICD-10-CM | POA: Diagnosis not present

## 2023-02-17 DIAGNOSIS — M5459 Other low back pain: Secondary | ICD-10-CM | POA: Diagnosis not present

## 2023-02-17 DIAGNOSIS — M549 Dorsalgia, unspecified: Secondary | ICD-10-CM

## 2023-02-17 DIAGNOSIS — N138 Other obstructive and reflux uropathy: Secondary | ICD-10-CM

## 2023-02-17 LAB — CBC
HCT: 38.9 % — ABNORMAL LOW (ref 39.0–52.0)
Hemoglobin: 13.3 g/dL (ref 13.0–17.0)
MCH: 30.8 pg (ref 26.0–34.0)
MCHC: 34.2 g/dL (ref 30.0–36.0)
MCV: 90 fL (ref 80.0–100.0)
Platelets: 148 10*3/uL — ABNORMAL LOW (ref 150–400)
RBC: 4.32 MIL/uL (ref 4.22–5.81)
RDW: 13 % (ref 11.5–15.5)
WBC: 9.5 10*3/uL (ref 4.0–10.5)
nRBC: 0 % (ref 0.0–0.2)

## 2023-02-17 LAB — URINALYSIS, ROUTINE W REFLEX MICROSCOPIC
Bilirubin Urine: NEGATIVE
Glucose, UA: 150 mg/dL — AB
Hgb urine dipstick: NEGATIVE
Ketones, ur: 5 mg/dL — AB
Leukocytes,Ua: NEGATIVE
Nitrite: NEGATIVE
Protein, ur: NEGATIVE mg/dL
Specific Gravity, Urine: 1.016 (ref 1.005–1.030)
pH: 5 (ref 5.0–8.0)

## 2023-02-17 LAB — URINALYSIS, COMPLETE (UACMP) WITH MICROSCOPIC
Bilirubin Urine: NEGATIVE
Glucose, UA: 250 mg/dL — AB
Hgb urine dipstick: NEGATIVE
Ketones, ur: 40 mg/dL — AB
Leukocytes,Ua: NEGATIVE
Nitrite: NEGATIVE
Protein, ur: NEGATIVE mg/dL
Specific Gravity, Urine: 1.015 (ref 1.005–1.030)
pH: 5.5 (ref 5.0–8.0)

## 2023-02-17 LAB — COMPREHENSIVE METABOLIC PANEL
ALT: 18 U/L (ref 0–44)
AST: 27 U/L (ref 15–41)
Albumin: 3.9 g/dL (ref 3.5–5.0)
Alkaline Phosphatase: 83 U/L (ref 38–126)
Anion gap: 8 (ref 5–15)
BUN: 14 mg/dL (ref 8–23)
CO2: 22 mmol/L (ref 22–32)
Calcium: 9.1 mg/dL (ref 8.9–10.3)
Chloride: 98 mmol/L (ref 98–111)
Creatinine, Ser: 0.86 mg/dL (ref 0.61–1.24)
GFR, Estimated: >60 mL/min (ref >60)
Glucose, Bld: 159 mg/dL — ABNORMAL HIGH (ref 70–99)
Potassium: 3.7 mmol/L (ref 3.5–5.1)
Sodium: 128 mmol/L — ABNORMAL LOW (ref 135–145)
Total Bilirubin: 0.9 mg/dL (ref 0.3–1.2)
Total Protein: 7.9 g/dL (ref 6.5–8.1)

## 2023-02-17 LAB — LIPASE, BLOOD: Lipase: 27 U/L (ref 11–51)

## 2023-02-17 LAB — BLADDER SCAN AMB NON-IMAGING

## 2023-02-17 MED ORDER — CEFDINIR 300 MG PO CAPS: 300.0000 mg | ORAL_CAPSULE | Freq: Two times a day (BID) | ORAL | 0 refills | Status: AC

## 2023-02-17 MED ORDER — SODIUM CHLORIDE 0.9 % IV BOLUS
1000.0000 mL | Freq: Once | INTRAVENOUS | Status: AC
  Administered 2023-02-17: 1000 mL via INTRAVENOUS

## 2023-02-17 MED ORDER — CEFDINIR 300 MG PO CAPS
300.0000 mg | ORAL_CAPSULE | Freq: Once | ORAL | Status: AC
  Administered 2023-02-17: 300 mg via ORAL
  Filled 2023-02-17: qty 1

## 2023-02-17 MED ORDER — KETOROLAC TROMETHAMINE 15 MG/ML IJ SOLN
15.0000 mg | Freq: Once | INTRAMUSCULAR | Status: AC
Start: 2023-02-17 — End: 2023-02-17
  Administered 2023-02-17: 15 mg via INTRAVENOUS
  Filled 2023-02-17: qty 1

## 2023-02-17 MED ORDER — IOHEXOL 300 MG/ML  SOLN
100.0000 mL | Freq: Once | INTRAMUSCULAR | Status: AC | PRN
  Administered 2023-02-17: 100 mL via INTRAVENOUS

## 2023-02-17 NOTE — ED Provider Notes (Signed)
[facility] Provider Note   Event Date/Time   First MD Initiated Contact with Patient 02/17/23 1639     (approximate) History  Back Pain and Abdominal Pain  HPI Kyle Hood is a 67 y.o. adult with a stated past medical history of lumbar DJD, anxiety/depression, lumbar polyradiculopathy, intermittent vertigo, and polyneuropathy who presents complaining of low back pain, generalized abdominal pain, dark stools, and constipation.  Patient states that similar symptoms have happened to him in the past and are associated with lymphadenopathy of the neck and fatigue.  Patient states that he is having similar symptoms at this time.  Patient has not attempted any medications for the symptoms ROS: Patient currently denies any vision changes, tinnitus, difficulty speaking, facial droop, sore throat, chest pain, shortness of breath, nausea/vomiting/diarrhea, dysuria, or weakness/numbness/paresthesias in any extremity   Physical Exam  Triage Vital Signs: ED Triage Vitals [02/17/23 1306]  Encounter Vitals Group     BP (!) 155/61     Systolic BP Percentile      Diastolic BP Percentile      Pulse Rate 95     Resp 18     Temp 98.4 F (36.9 C)     Temp Source Oral     SpO2 96 %     Weight 275 lb (124.7 kg)     Height 6' (1.829 m)     Head Circumference      Peak Flow      Pain Score 7     Pain Loc      Pain Education      Exclude from Growth Chart    Most recent vital signs: Vitals:   02/17/23 1306 02/17/23 1712  BP: (!) 155/61 (!) 150/60  Pulse: 95 88  Resp: 18 18  Temp: 98.4 F (36.9 C) 98 F (36.7 C)  SpO2: 96% 96%   General: Awake, oriented x4. CV:  Good peripheral perfusion.  Resp:  Normal effort.  Abd:  No distention.  Generalized mild tenderness to palpation Other:  Insert elderly overweight Caucasian male resting comfortably in no acute distress ED Results / Procedures / Treatments  Labs (all labs ordered are listed, but only abnormal results are  displayed) Labs Reviewed  COMPREHENSIVE METABOLIC PANEL - Abnormal; Notable for the following components:      Result Value   Sodium 128 (*)    Glucose, Bld 159 (*)    All other components within normal limits  CBC - Abnormal; Notable for the following components:   HCT 38.9 (*)    Platelets 148 (*)    All other components within normal limits  URINALYSIS, ROUTINE W REFLEX MICROSCOPIC - Abnormal; Notable for the following components:   Color, Urine YELLOW (*)    APPearance CLEAR (*)    Glucose, UA 150 (*)    Ketones, ur 5 (*)    All other components within normal limits  LIPASE, BLOOD   EKG ED ECG REPORT I, Merwyn Katos, the attending physician, personally viewed and interpreted this ECG. Date: 02/17/2023 EKG Time: 1409 Rate: 99 Rhythm: normal sinus rhythm QRS Axis: normal Intervals: normal ST/T Wave abnormalities: normal Narrative Interpretation: no evidence of acute ischemia RADIOLOGY ED MD interpretation: CT of the abdomen and pelvis with IV contrast interpreted independently by me and shows right renal enhancement with faint right perinephric edema suspicious for possible pyelonephritis.  There is also multiple prominent lymph nodes in the central small bowel mesentery likely reactive but nonspecific -Agree with radiology  assessment Official radiology report(s): CT ABDOMEN PELVIS W CONTRAST  Result Date: 02/17/2023 CLINICAL DATA:  Upper abdominal pain. EXAM: CT ABDOMEN AND PELVIS WITH CONTRAST TECHNIQUE: Multidetector CT imaging of the abdomen and pelvis was performed using the standard protocol following bolus administration of intravenous contrast. RADIATION DOSE REDUCTION: This exam was performed according to the departmental dose-optimization program which includes automated exposure control, adjustment of the mA and/or kV according to patient size and/or use of iterative reconstruction technique. CONTRAST:  OMNIPAQUE IOHEXOL 300 MG/ML  SOLN COMPARISON:  None  Available. FINDINGS: Lower chest: No basilar airspace disease or pleural effusion. Hepatobiliary: Diffusely decreased hepatic density typical of steatosis. No focal liver abnormality. Gallbladder physiologically distended, no calcified stone. No biliary dilatation. Pancreas: No ductal dilatation or inflammation. No evidence of pancreatic mass. Spleen: Normal in size without focal abnormality. Adrenals/Urinary Tract: Normal adrenal glands. Heterogeneous right renal enhancement with faint right perinephric edema. There is slight heterogeneous left renal enhancement in the upper pole to a lesser extent. No hydronephrosis. No visualized urolithiasis. Decompressed ureters. Mild bladder distension but no wall thickening or perivesicular inflammation Stomach/Bowel: Small hiatal hernia. No bowel obstruction or inflammation. Normal appendix. Small to moderate volume of colonic stool. Sigmoid colon is redundant. Vascular/Lymphatic: No acute vascular findings. Normal caliber abdominal aorta. Mild atherosclerosis. Patent portal and splenic veins. There are multiple prominent lymph nodes in the central small bowel mesentery measuring up to 11 mm. Reproductive: Enlarged prostate spans 5.5 cm. Other: No free air, free fluid, or intra-abdominal fluid collection. Musculoskeletal: Lumbar spondylosis with spurring and lower lumbar facet hypertrophy. There is a prominent Schmorl's node involving the inferior endplate of L2. IMPRESSION: 1. Heterogeneous right renal enhancement with faint right perinephric edema, suspicious for pyelonephritis. There is slight heterogeneous left renal enhancement in the upper pole to a lesser extent. Recommend correlation with urinalysis. 2. Multiple prominent lymph nodes in the central small bowel mesentery, likely reactive, but nonspecific. Consider follow-up CT after a course of treatment to ensure resolution. 3. Hepatic steatosis. 4. Small hiatal hernia. 5. Enlarged prostate. Aortic Atherosclerosis  (ICD10-I70.0). Electronically Signed   By: Narda Rutherford M.D.   On: 02/17/2023 21:34   PROCEDURES: Critical Care performed: No .1-3 Lead EKG Interpretation  Performed by: Merwyn Katos, MD Authorized by: Merwyn Katos, MD     Interpretation: normal     ECG rate:  71   ECG rate assessment: normal     Rhythm: sinus rhythm     Ectopy: none     Conduction: normal    MEDICATIONS ORDERED IN ED: Medications  cefdinir (OMNICEF) capsule 300 mg (has no administration in time range)  ketorolac (TORADOL) 15 MG/ML injection 15 mg (15 mg Intravenous Given 02/17/23 1711)  sodium chloride 0.9 % bolus 1,000 mL (0 mLs Intravenous Stopped 02/17/23 1950)  iohexol (OMNIPAQUE) 300 MG/ML solution 100 mL (100 mLs Intravenous Contrast Given 02/17/23 1817)   IMPRESSION / MDM / ASSESSMENT AND PLAN / ED COURSE  I reviewed the triage vital signs and the nursing notes.                             The patient is on the cardiac monitor to evaluate for evidence of arrhythmia and/or significant heart rate changes. Patient's presentation is most consistent with acute presentation with potential threat to life or bodily function. Patient presents for abdominal pain.  Differential diagnosis includes appendicitis, abdominal aortic aneurysm, surgical biliary disease, pancreatitis, SBO, mesenteric  ischemia, serious intra-abdominal bacterial illness, genital torsion. Doubt atypical ACS. Based on history, physical exam, radiologic/laboratory evaluation, there is no red flag results or symptomatology requiring emergent intervention or need for admission at this time Pt tolerating PO. Given findings of possible pyelonephritis as well as possible early enteritis, it is unclear at this time whether patient may have bacterial infection.  White blood cell count is normal at this time.  Patient's hyponatremia corrected via IV NS.  Patient placed on cefdinir course with strict return precautions given findings on  CT. Disposition: Patient will be discharged with strict return precautions and follow up with primary MD within 12-24 hours for further evaluation. Patient understands that this still may have an early presentation of an emergent medical condition such as appendicitis that will require a recheck.   FINAL CLINICAL IMPRESSION(S) / ED DIAGNOSES   Final diagnoses:  Acute bilateral back pain, unspecified back location  Abdominal pain, generalized  Enteritis   Rx / DC Orders   ED Discharge Orders          Ordered    cefdinir (OMNICEF) 300 MG capsule  2 times daily        02/17/23 2203           Note:  This document was prepared using Dragon voice recognition software and may include unintentional dictation errors.   Merwyn Katos, MD 02/17/23 2218

## 2023-02-17 NOTE — Discharge Instructions (Addendum)
Please use ibuprofen (Motrin) up to 800 mg every 8 hours, naproxen (Naprosyn) up to 500 mg every 12 hours, and/or acetaminophen (Tylenol) up to 4 g/day for any continued pain.  Please do not use this medication regimen for longer than 7 days You may use one half capful twice a day of MiraLAX in order to have 1 solid well-formed bowel movement per day.  You may increase or decrease this dosage as needed to obtain this 1 well-formed bowel movement.  Please make sure that you are drinking at least 8 ounces of water every hour during this initial bowel regimen.

## 2023-02-17 NOTE — ED Triage Notes (Signed)
Pt complains of lower back pain radiates to lower abd. Complains of tenderness in abd. Went to Urology today and was negative for UTI. Pt states black stool in BM 2 days ago and no BM since then. Came to ER and left before being seen.

## 2023-02-17 NOTE — ED Provider Triage Note (Signed)
Emergency Medicine Provider Triage Evaluation Note  Kyle Hood , a 67 y.o. adult  was evaluated in triage.  Pt complains of abd pain with dark stools.  Review of Systems  Positive:  Negative:   Physical Exam  There were no vitals taken for this visit. Gen:   Awake, no distress   Resp:  Normal effort  MSK:   Moves extremities without difficulty  Other:    Medical Decision Making  Medically screening exam initiated at 1:06 PM.  Appropriate orders placed.  [name] was informed that the remainder of the evaluation will be completed by another provider, this initial triage assessment does not replace that evaluation, and the importance of remaining in the ED until their evaluation is complete.     Faythe Ghee, PA-C 02/17/23 1306

## 2023-02-17 NOTE — Progress Notes (Signed)
02/17/2023 12:05 PM   [name] July 21, 1955 [id number]  Referring provider: Dorothey Baseman, MD [phone] [place],  [place] [address]  Urological history: 1.  Elevated PSA -PSA (10/2022) 1.53 -jumped to 3.81 (04/2021) from 1.0 (2020)   2. BPH with LU TS -Tamsulosin 0.4 mg daily    Chief Complaint  Patient presents with   Dysuria    HPI: Kyle Hood is a 67 y.o. male who presents today for severe pain in the upper abdominal area radiating pain in the lower back, feverish feeling, decreased appetite, slow urine flow, color of urine rusty per patient, burning/stinging with urination, night sweats wit his partner, Kyle Hood.   Previous records reviewed.   He was seen in the ED yesterday for the complaint of a spasming like pain occurring between the shoulders and radiating to his upper abdomen.  He states that his EKG in the ED which was normal.  Troponin, serum creatinine was normal and WBC was mildly elevated at 10.7.  He continues to have the upper back pain radiating around under his arms to the front of his abdomen.  He has been clammy and cold like the fever is breaking.  No appetite, but with polydipsia.  He also mentioned that he has been passing dark black stools.  Patient denies any modifying or aggravating factors.  Patient denies any recent UTI's, gross hematuria, dysuria or suprapubic/flank pain.  Patient denies any fevers, chills, nausea or vomiting.    UA unremarkable  PVR 193 mL   Vital signs stable and afebrile in the office today.  PMH: Past Medical History:  Diagnosis Date   Abnormal liver function test    Anxiety    Anxiety    Arthritis    Chronic lower back pain    Degenerative joint disease    Depression    Fatty liver    Heart murmur    Hepatitis B    Hyperlipidemia    Hypertension    Lumbar polyradiculopathy    Panic attacks    Polyneuropathy    Vertigo 05/03/2017   ER    Surgical History: Past Surgical History:   Procedure Laterality Date   COLONOSCOPY     COLONOSCOPY WITH PROPOFOL N/A 10/01/2017   Procedure: COLONOSCOPY WITH PROPOFOL;  Surgeon: Toledo, Boykin Nearing, MD;  Location: [facility] ENDOSCOPY;  Service: Endoscopy;  Laterality: N/A;   PROSTATE BIOPSY      Home Medications:  Allergies as of 02/17/2023       Reactions   Lisinopril Cough        Medication List        Accurate as of February 17, 2023 12:05 PM. If you have any questions, ask your nurse or doctor.          amLODipine 5 MG tablet Commonly known as: NORVASC Take 5 mg by mouth daily.   clotrimazole-betamethasone cream Commonly known as: LOTRISONE Apply topically 2 (two) times daily.   cyclobenzaprine 5 MG tablet Commonly known as: FLEXERIL Take 1 tablet (5 mg total) by mouth 3 (three) times daily as needed.   FreeStyle Libre 3 Sensor Misc USE 1 SENSOR EVERY 14 DAYS   glipiZIDE 5 MG 24 hr tablet Commonly known as: GLUCOTROL XL Take by mouth.   hydrochlorothiazide 25 MG tablet Commonly known as: HYDRODIURIL Take 25 mg by mouth daily.   hydrOXYzine 25 MG tablet Commonly known as: ATARAX Take 1 tablet (25 mg total) by mouth every 8 (eight) hours as needed for anxiety.  lidocaine 5 % Commonly known as: Lidoderm Place 1 patch onto the skin every 12 (twelve) hours. Remove & Discard patch within 12 hours or as directed by MD   naproxen 500 MG tablet Commonly known as: NAPROSYN TK 1 T PO BID WC   pravastatin 20 MG tablet Commonly known as: PRAVACHOL Take 20 mg by mouth daily.   spironolactone 25 MG tablet Commonly known as: ALDACTONE   tamsulosin 0.4 MG Caps capsule Commonly known as: FLOMAX TAKE 1 CAPSULE(0.4 MG) BY MOUTH DAILY   Tradjenta 5 MG Tabs tablet Generic drug: linagliptin Take 5 mg by mouth daily.   traZODone 50 MG tablet Commonly known as: DESYREL Take 1 tablet (50 mg total) by mouth at bedtime as needed for sleep. Stop Quetiapine   valsartan 80 MG tablet Commonly known as:  DIOVAN Take 80 mg by mouth 2 (two) times daily.        Allergies:  Allergies  Allergen Reactions   Lisinopril Cough    Family History: Family History  Problem Relation Age of Onset   Cancer Mother        Pancreatic   Diabetes Mother    Hypertension Mother    Varicose Veins Mother    Alcohol abuse Mother    Heart disease Father        CABG   Cancer Father        Jaw   Aortic aneurysm Father    Congestive Heart Failure Father    Hypertension Father    Varicose Veins Father    Alcohol abuse Father    Bipolar disorder Sister    Bipolar disorder Brother     Social History:  reports that he has never smoked. He has never used smokeless tobacco. He reports current drug use. Drug: Marijuana. He reports that he does not drink alcohol.  ROS: Pertinent ROS in HPI  Physical Exam: BP (!) 162/81 (BP Location: Left Arm, Patient Position: Sitting, Cuff Size: Large)   Pulse 99   Temp 97.7 F (36.5 C) (Oral)   Ht 6\' 2"  (1.88 m)   Wt 267 lb 9.6 oz (121.4 kg)   BMI 34.36 kg/m   Constitutional:  Well nourished. Alert and oriented, appears uncomfortable.   HEENT: [place] AT, moist mucus membranes.  Trachea midline Cardiovascular: No clubbing, cyanosis, or edema. Neurologic: Grossly intact, no focal deficits, moving all 4 extremities. Psychiatric: Normal mood and affect.  Laboratory Data: Lab Results  Component Value Date   WBC 10.7 (H) 02/16/2023   HGB 14.1 02/16/2023   HCT 42.0 02/16/2023   MCV 92.3 02/16/2023   PLT 178 02/16/2023    Lab Results  Component Value Date   CREATININE 0.99 02/16/2023   Urinalysis: Component     Latest Ref Rng 02/17/2023  Glucose, UA     NEGATIVE mg/dL 161 !   WBC, UA     0 - 5 WBC/hpf 0-5   Bacteria, UA     NONE SEEN  FEW !   Color, Urine     YELLOW  YELLOW   Appearance     CLEAR  CLEAR   Specific Gravity, Urine     1.005 - 1.030  1.015   pH     5.0 - 8.0  5.5   Hgb urine dipstick     NEGATIVE  NEGATIVE   Bilirubin Urine      NEGATIVE  NEGATIVE   Ketones, ur     NEGATIVE mg/dL 40 !   Protein  NEGATIVE mg/dL NEGATIVE   Nitrite     NEGATIVE  NEGATIVE   Leukocytes,Ua     NEGATIVE  NEGATIVE   Squamous Epithelial / HPF     0 - 5 /HPF 0-5   RBC / HPF     0 - 5 RBC/hpf 0-5     Legend: ! Abnormal I have reviewed the labs.   Pertinent Imaging:  02/17/23 11:34  Scan Result   Assessment & Plan:    1. BPH with LUTS - Bladder Scan (Post Void Residual) in office - PVR demonstrates adequate emptying  2.  Abdominal pain -Explained to the patient that his partner that the location of his pain is not a typical presentation for urological etiology and with his bladder scan noting adequate bladder emptying and his UA was negative, I recommend he seek care in the ED to rule out an acute GI process such as pancreatitis, cholecystitis or bleeding ulcer, especially with the black tarry stools -they are in agreement   3. Black Tarry stools -Explained that this may be a symptom of GI bleed and to seek further treatment in the ED  Return for advised to seek further treatment in the ED .  These notes generated with voice recognition software. I apologize for typographical errors.  [name]  [facility] [address]  [address] [place], [place] [address] [phone]

## 2023-02-17 NOTE — Progress Notes (Signed)
Patient called stating he has been having severe pain in the upper abdominal area radiating pain in the lower back, feverish feeling, decreased appetite, slow urine flow, color of urine rusty per patient, burning/stinging with urination, night sweats. started around Thursday or Friday of last week, he went to ER Saturday but left after waiting for 3hours and not getting to be seen still.  Per [name] come to the office in [place] to be seen now with UA and Urine culture and PVR. Patient and his partner advised.

## 2023-02-17 NOTE — ED Notes (Signed)
See triage note  Presents with lower back pain and abd pain  States pain started in his back initially

## 2023-02-18 LAB — URINE CULTURE: Culture: NO GROWTH

## 2023-02-23 ENCOUNTER — Other Ambulatory Visit: Payer: Self-pay

## 2023-02-23 ENCOUNTER — Emergency Department
Admission: EM | Admit: 2023-02-23 | Discharge: 2023-02-23 | Disposition: A | Discharge status: Home / Self Care | Payer: Medicare HMO | Attending: Emergency Medicine | Admitting: Emergency Medicine

## 2023-02-23 ENCOUNTER — Emergency Department: Payer: Medicare HMO

## 2023-02-23 DIAGNOSIS — N289 Disorder of kidney and ureter, unspecified: Secondary | ICD-10-CM

## 2023-02-23 DIAGNOSIS — N2889 Other specified disorders of kidney and ureter: Secondary | ICD-10-CM | POA: Insufficient documentation

## 2023-02-23 DIAGNOSIS — R1031 Right lower quadrant pain: Secondary | ICD-10-CM | POA: Diagnosis present

## 2023-02-23 DIAGNOSIS — I10 Essential (primary) hypertension: Secondary | ICD-10-CM | POA: Insufficient documentation

## 2023-02-23 DIAGNOSIS — R109 Unspecified abdominal pain: Secondary | ICD-10-CM

## 2023-02-23 LAB — COMPREHENSIVE METABOLIC PANEL
ALT: 21 U/L (ref 0–44)
AST: 45 U/L — ABNORMAL HIGH (ref 15–41)
Albumin: 4 g/dL (ref 3.5–5.0)
Alkaline Phosphatase: 80 U/L (ref 38–126)
Anion gap: 11 (ref 5–15)
BUN: 13 mg/dL (ref 8–23)
CO2: 21 mmol/L — ABNORMAL LOW (ref 22–32)
Calcium: 9.2 mg/dL (ref 8.9–10.3)
Chloride: 99 mmol/L (ref 98–111)
Creatinine, Ser: 0.77 mg/dL (ref 0.61–1.24)
GFR, Estimated: >60 mL/min (ref >60)
Glucose, Bld: 150 mg/dL — ABNORMAL HIGH (ref 70–99)
Potassium: 4.1 mmol/L (ref 3.5–5.1)
Sodium: 131 mmol/L — ABNORMAL LOW (ref 135–145)
Total Bilirubin: 1 mg/dL (ref 0.3–1.2)
Total Protein: 8 g/dL (ref 6.5–8.1)

## 2023-02-23 LAB — URINALYSIS, W/ REFLEX TO CULTURE (INFECTION SUSPECTED)
Bilirubin Urine: NEGATIVE
Glucose, UA: NEGATIVE mg/dL
Hgb urine dipstick: NEGATIVE
Ketones, ur: NEGATIVE mg/dL
Leukocytes,Ua: NEGATIVE
Nitrite: NEGATIVE
Protein, ur: NEGATIVE mg/dL
Specific Gravity, Urine: 1.035 — ABNORMAL HIGH (ref 1.005–1.030)
Squamous Epithelial / HPF: 0 /[HPF] (ref 0–5)
WBC, UA: 0 WBC/hpf (ref 0–5)
pH: 6 (ref 5.0–8.0)

## 2023-02-23 LAB — LIPASE, BLOOD: Lipase: 27 U/L (ref 11–51)

## 2023-02-23 LAB — CBC WITH DIFFERENTIAL/PLATELET
Abs Immature Granulocytes: 0.78 10*3/uL — ABNORMAL HIGH (ref 0.00–0.07)
Basophils Absolute: 0.1 10*3/uL (ref 0.0–0.1)
Basophils Relative: 1 %
Eosinophils Absolute: 0.1 10*3/uL (ref 0.0–0.5)
Eosinophils Relative: 1 %
HCT: 39.4 % (ref 39.0–52.0)
Hemoglobin: 13.8 g/dL (ref 13.0–17.0)
Immature Granulocytes: 8 %
Lymphocytes Relative: 27 %
Lymphs Abs: 2.6 10*3/uL (ref 0.7–4.0)
MCH: 30.8 pg (ref 26.0–34.0)
MCHC: 35 g/dL (ref 30.0–36.0)
MCV: 87.9 fL (ref 80.0–100.0)
Monocytes Absolute: 0.7 10*3/uL (ref 0.1–1.0)
Monocytes Relative: 7 %
Neutro Abs: 5.3 10*3/uL (ref 1.7–7.7)
Neutrophils Relative %: 56 %
Platelets: 149 10*3/uL — ABNORMAL LOW (ref 150–400)
RBC: 4.48 MIL/uL (ref 4.22–5.81)
RDW: 12.9 % (ref 11.5–15.5)
Smear Review: NORMAL
WBC: 9.5 10*3/uL (ref 4.0–10.5)
nRBC: 0.3 % — ABNORMAL HIGH (ref 0.0–0.2)

## 2023-02-23 MED ORDER — IOHEXOL 300 MG/ML  SOLN
100.0000 mL | Freq: Once | INTRAMUSCULAR | Status: AC | PRN
  Administered 2023-02-23: 100 mL via INTRAVENOUS

## 2023-02-23 MED ORDER — MORPHINE SULFATE (PF) 4 MG/ML IV SOLN
4.0000 mg | Freq: Once | INTRAVENOUS | Status: AC
Start: 2023-02-23 — End: 2023-02-23
  Administered 2023-02-23: 4 mg via INTRAVENOUS
  Filled 2023-02-23: qty 1

## 2023-02-23 MED ORDER — ACETAMINOPHEN 500 MG PO TABS
1000.0000 mg | ORAL_TABLET | Freq: Once | ORAL | Status: AC
  Administered 2023-02-23: 1000 mg via ORAL
  Filled 2023-02-23: qty 2

## 2023-02-23 MED ORDER — ONDANSETRON HCL 4 MG/2ML IJ SOLN
4.0000 mg | Freq: Once | INTRAMUSCULAR | Status: AC
  Administered 2023-02-23: 4 mg via INTRAVENOUS
  Filled 2023-02-23: qty 2

## 2023-02-23 MED ORDER — KETOROLAC TROMETHAMINE 15 MG/ML IJ SOLN
15.0000 mg | Freq: Once | INTRAMUSCULAR | Status: AC
  Administered 2023-02-23: 15 mg via INTRAVENOUS
  Filled 2023-02-23: qty 1

## 2023-02-23 NOTE — Discharge Instructions (Addendum)
There were no emergency conditions that account for your pain that we could identify in the emergency department today.  However your CT scan did show some persistent kidney lesions that need further testing.  I am not sure what these were present but one possibility could be cancer.  I have referred you to cancer specialist who should reach out to you to schedule follow-up appointment and further testing.  Please also reach out to your primary doctor to review these findings and any referrals to other specialist as needed in addition to oncologist.  Thank you for choosing [place] for your health care today!  Please see your primary doctor this week for a follow up appointment.   If you have any new, worsening, or unexpected symptoms call your doctor right away or come back to the emergency department for reevaluation.  It was my pleasure to care for you today.   Daneil Dan Modesto Charon, MD

## 2023-02-23 NOTE — ED Triage Notes (Signed)
Pt arrives via EMS from home. PT reports right upper quadrant pain that started this morning. PT reports he was recently treated for pyelonephritis and has had improvement since then. PT arrives AxOx4. Denies n/v/d.

## 2023-02-23 NOTE — ED Provider Notes (Signed)
[facility] Provider Note    Event Date/Time   First MD Initiated Contact with Patient 02/23/23 [phone]     (approximate)   History   Abdominal Pain   HPI  Kyle Hood is a 67 y.o. adult   Past medical history of hypertension, hyperlipidemia, fatty liver, anxiety and depression, chronic lower back pain who presents to the Emergency Department with right-sided abdominal pain starting this morning after eating breakfast.  He went to bed last night feeling well, had been feeling much improved from her's emergency department visit 1 week ago when he was diagnosed with suspected pyelonephritis and antibiotic which has been taken.  Upon waking this morning right-sided abdominal pain upper and lower quadrants nonradiating.  He denies any urinary symptoms denies testicular pain.  He does not have chest pain respiratory symptoms and denies any other acute medical complaints.  Independent Historian contributed to assessment above: EMS gives report as above  External Medical Documents Reviewed: Emergency department visit dated 02/17/2023 when he had CT scan performed showing right renal enhancement with faint perinephric edema suspicious for pyelonephritis but a normal-appearing urine, at that time no noted gallbladder stones      Physical Exam   Triage Vital Signs: ED Triage Vitals  Encounter Vitals Group     BP --      Systolic BP Percentile --      Diastolic BP Percentile --      Pulse Rate 02/23/23 0808 89     Resp 02/23/23 0808 18     Temp 02/23/23 0808 98.1 F (36.7 C)     Temp src --      SpO2 02/23/23 0805 99 %     Weight 02/23/23 0807 270 lb (122.5 kg)     Height 02/23/23 0807 6' (1.829 m)     Head Circumference --      Peak Flow --      Pain Score 02/23/23 0807 10     Pain Loc --      Pain Education --      Exclude from Growth Chart --     Most recent vital signs: Vitals:   02/23/23 0810 02/23/23 0830  BP: (!) 159/72 (!) 143/75  Pulse:  88   Resp:  16  Temp:    SpO2:  97%    General: Awake, no distress.  CV:  Good peripheral perfusion.  Resp:  Normal effort.  Abd:  No distention.  Other:  Appears uncomfortable.  His right sided upper and lower quadrant tenderness to palpation without rigidity or guarding.  He has no CVA tenderness.   ED Results / Procedures / Treatments   Labs (all labs ordered are listed, but only abnormal results are displayed) Labs Reviewed  COMPREHENSIVE METABOLIC PANEL - Abnormal; Notable for the following components:      Result Value   Sodium 131 (*)    CO2 21 (*)    Glucose, Bld 150 (*)    AST 45 (*)    All other components within normal limits  CBC WITH DIFFERENTIAL/PLATELET - Abnormal; Notable for the following components:   Platelets 149 (*)    nRBC 0.3 (*)    Abs Immature Granulocytes 0.78 (*)    All other components within normal limits  URINALYSIS, W/ REFLEX TO CULTURE (INFECTION SUSPECTED) - Abnormal; Notable for the following components:   Color, Urine STRAW (*)    APPearance CLEAR (*)    Specific Gravity, Urine 1.035 (*)  Bacteria, UA RARE (*)    All other components within normal limits  LIPASE, BLOOD     I ordered and reviewed the above labs they are notable for rare bacteria no inflammatory changes in urine, electrolytes and cell counts largely unremarkable  EKG  ED ECG REPORT I, Pilar Jarvis, the attending physician, personally viewed and interpreted this ECG.   Date: 02/23/2023  EKG Time: 0807  Rate: 95  Rhythm: sinus  Axis: nl  Intervals:none  ST&T Change: no stemi    RADIOLOGY I independently reviewed and interpreted CT of the abdomen pelvis see no obvious obstructive or inflammatory changes I also reviewed radiologist's formal read.   PROCEDURES:  Critical Care performed: No  Procedures   MEDICATIONS ORDERED IN ED: Medications  morphine (PF) 4 MG/ML injection 4 mg (4 mg Intravenous Given 02/23/23 1610)  ketorolac (TORADOL) 15 MG/ML injection  15 mg (15 mg Intravenous Given 02/23/23 0824)  ondansetron (ZOFRAN) injection 4 mg (4 mg Intravenous Given 02/23/23 0825)  acetaminophen (TYLENOL) tablet 1,000 mg (1,000 mg Oral Given 02/23/23 0821)  iohexol (OMNIPAQUE) 300 MG/ML solution 100 mL (100 mLs Intravenous Contrast Given 02/23/23 0855)     IMPRESSION / MDM / ASSESSMENT AND PLAN / ED COURSE  I reviewed the triage vital signs and the nursing notes.                                Patient's presentation is most consistent with acute presentation with potential threat to life or bodily function.  Differential diagnosis includes, but is not limited to, cholecystitis, biliary colic, cholelithiasis, choledocholithiasis, appendicitis, pyelonephritis, colitis, enteritis, gas pains, renal colic in the setting of kidney stones, obstruction, dissection/aneurysm   The patient is on the cardiac monitor to evaluate for evidence of arrhythmia and/or significant heart rate changes.  MDM:     Acute onset right-sided abdominal pain with tenderness to right upper and lower quadrant suspicious for intra-abdominal infection/obstruction/worsening pyelonephritis, will start with a CT scan at abdomen/pelvis to get a global view of the intra-abdominal contents differentiate between potential diagnosis as above.  Get basic labs including LFTs lipase and urinalysis.  Treat pain with IV morphine, Toradol, Tylenol and Zofran.    --   Fortunately workup in the department did not show any emergency conditions that account for the patient's pain however there are sees persistent renal abnormalities found on CT scan noted to be suspicious for cancerous lesions, less likely ischemic or infectious.  I made a referral to oncology.  I explained to the patient these findings and need for follow-up.     FINAL CLINICAL IMPRESSION(S) / ED DIAGNOSES   Final diagnoses:  Right sided abdominal pain  Renal lesion     Rx / DC Orders   ED Discharge Orders           Ordered    Ambulatory referral to Hematology / Oncology        02/23/23 0937             Note:  This document was prepared using Dragon voice recognition software and may include unintentional dictation errors.    Pilar Jarvis, MD 02/23/23 1010

## 2023-02-26 ENCOUNTER — Inpatient Hospital Stay: Payer: Medicare HMO | Attending: Oncology | Admitting: Oncology

## 2023-02-26 ENCOUNTER — Encounter: Payer: Self-pay | Admitting: Oncology

## 2023-02-26 ENCOUNTER — Inpatient Hospital Stay: Payer: Medicare HMO

## 2023-02-26 VITALS — BP 140/77 | HR 93 | Temp 99.9°F | Resp 18 | Ht 72.0 in | Wt 263.8 lb

## 2023-02-26 DIAGNOSIS — D696 Thrombocytopenia, unspecified: Secondary | ICD-10-CM | POA: Insufficient documentation

## 2023-02-26 DIAGNOSIS — Z808 Family history of malignant neoplasm of other organs or systems: Secondary | ICD-10-CM | POA: Insufficient documentation

## 2023-02-26 DIAGNOSIS — R634 Abnormal weight loss: Secondary | ICD-10-CM | POA: Insufficient documentation

## 2023-02-26 DIAGNOSIS — C9100 Acute lymphoblastic leukemia not having achieved remission: Secondary | ICD-10-CM | POA: Insufficient documentation

## 2023-02-26 DIAGNOSIS — Z800 Family history of malignant neoplasm of digestive organs: Secondary | ICD-10-CM | POA: Insufficient documentation

## 2023-02-26 DIAGNOSIS — R93429 Abnormal radiologic findings on diagnostic imaging of unspecified kidney: Secondary | ICD-10-CM | POA: Insufficient documentation

## 2023-02-26 LAB — COMPREHENSIVE METABOLIC PANEL
ALT: 20 U/L (ref 0–44)
AST: 28 U/L (ref 15–41)
Albumin: 3.8 g/dL (ref 3.5–5.0)
Alkaline Phosphatase: 74 U/L (ref 38–126)
Anion gap: 10 (ref 5–15)
BUN: 15 mg/dL (ref 8–23)
CO2: 22 mmol/L (ref 22–32)
Calcium: 9.1 mg/dL (ref 8.9–10.3)
Chloride: 101 mmol/L (ref 98–111)
Creatinine, Ser: 0.86 mg/dL (ref 0.61–1.24)
GFR, Estimated: >60 mL/min (ref >60)
Glucose, Bld: 92 mg/dL (ref 70–99)
Potassium: 3.8 mmol/L (ref 3.5–5.1)
Sodium: 133 mmol/L — ABNORMAL LOW (ref 135–145)
Total Bilirubin: 0.4 mg/dL (ref 0.3–1.2)
Total Protein: 7.4 g/dL (ref 6.5–8.1)

## 2023-02-26 LAB — HIV ANTIBODY (ROUTINE TESTING W REFLEX): HIV Screen 4th Generation wRfx: NONREACTIVE

## 2023-02-26 LAB — FOLATE: Folate: 10 ng/mL (ref >5.9)

## 2023-02-26 LAB — TSH: TSH: 2.794 u[IU]/mL (ref 0.350–4.500)

## 2023-02-26 LAB — TECHNOLOGIST SMEAR REVIEW

## 2023-02-26 LAB — LACTATE DEHYDROGENASE: LDH: 264 U/L — ABNORMAL HIGH (ref 98–192)

## 2023-02-26 LAB — VITAMIN B12: Vitamin B-12: 334 pg/mL (ref 180–914)

## 2023-02-26 LAB — HEPATITIS PANEL, ACUTE
HCV Ab: NONREACTIVE
Hep A IgM: NONREACTIVE
Hep B C IgM: NONREACTIVE
Hepatitis B Surface Ag: NONREACTIVE

## 2023-02-26 LAB — PATHOLOGIST SMEAR REVIEW

## 2023-02-26 NOTE — Assessment & Plan Note (Addendum)
CT scan findings were reviewed with patient. Infiltrative kidney lesions.differential includes lymphoma,  I discussed with radiology. Will obtain MRI abdomen Renal protocol for further evaluation.  Check cbc, smear, cmp , LDH, peripheral blood flowcytometry. Myeloma panel, light chain ratio.   -LDH is elevated. Smear showed rate immature cells.  -peripheral smear showed immature cells.  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 I recommend obtaining PET scan for further evaluation. Consider bone marrow biopsy

## 2023-02-26 NOTE — Assessment & Plan Note (Addendum)
Check B12, Folate, hepatitis, HIV

## 2023-02-26 NOTE — Assessment & Plan Note (Signed)
Check TSH. Pending above work up

## 2023-02-26 NOTE — Progress Notes (Addendum)
Hematology/Oncology Consult Note [phone] [phone]     REFERRING PROVIDER: Pilar Jarvis, MD    CHIEF COMPLAINTS/PURPOSE OF CONSULTATION:  Renal lesions  ASSESSMENT & PLAN:   Abnormal CT scan, kidney CT scan findings were reviewed with patient. Infiltrative kidney lesions.differential includes lymphoma,  I discussed with radiology. Will obtain MRI abdomen Renal protocol for further evaluation.  Check cbc, smear, cmp , LDH, peripheral blood flowcytometry. Myeloma panel, light chain ratio.   -LDH is elevated. Smear showed rate immature cells.  -peripheral smear showed immature cells.  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 I recommend obtaining PET scan for further evaluation. Consider bone marrow biopsy  Unintentional weight loss Check TSH. Pending above work up  Thrombocytopenia (HCC) Check B12, Folate, hepatitis, HIV    Orders Placed This Encounter  Procedures   CBC with Differential/Platelet    Standing Status:   Future    Number of Occurrences:   1    Standing Expiration Date:   02/26/2024   Lactate dehydrogenase    Standing Status:   Future    Number of Occurrences:   1    Standing Expiration Date:   02/26/2024   Flow cytometry panel-leukemia/lymphoma work-up    Standing Status:   Future    Number of Occurrences:   1    Standing Expiration Date:   02/26/2024   Kappa/lambda light chains    Standing Status:   Future    Number of Occurrences:   1    Standing Expiration Date:   02/26/2024   Multiple Myeloma Panel (SPEP&IFE w/QIG)    Standing Status:   Future    Number of Occurrences:   1    Standing Expiration Date:   02/26/2024   Hepatitis panel, acute    Standing Status:   Future     Number of Occurrences:   1    Standing Expiration Date:   02/26/2024   HIV Antibody (routine testing w rflx)    Standing Status:   Future    Number of Occurrences:   1    Standing Expiration Date:   02/26/2024   TSH    Standing Status:   Future    Number of Occurrences:   1    Standing Expiration Date:   02/26/2024   Vitamin B12    Standing Status:   Future    Number of Occurrences:   1    Standing Expiration Date:   02/26/2024   Folate    Standing Status:   Future    Number of Occurrences:   1    Standing Expiration Date:   02/26/2024   Comprehensive metabolic panel    Standing Status:   Future    Number of Occurrences:   1    Standing Expiration Date:   02/26/2024   Technologist smear review    Standing Status:   Future    Number of Occurrences:   1    Standing Expiration Date:   02/26/2024    Order Specific Question:   Clinical information:    Answer:   night sweats   Pathologist smear review    All questions were answered. The patient knows to call the clinic with any problems, questions or concerns.  Rickard Patience, MD, PhD [facility] Hematology Oncology 02/26/2023  HISTORY OF PRESENTING ILLNESS:  Kyle Hood 67 y.o. adult presents to establish care for renal lesions.   The patient presented for evaluation of abnormal findings on two recent CT scans. Initially, the patient was treated for suspected pyelonephritis based on the first CT scan on 02/17/2023  1. Heterogeneous right renal enhancement with faint right perinephric edema, suspicious for pyelonephritis. There is slight heterogeneous left renal enhancement in the upper pole to a lesser extent. Recommend correlation with urinalysis. 2. Multiple prominent lymph nodes in the central small bowel mesentery, likely reactive, but nonspecific. Consider follow-up CT after a course of treatment to ensure resolution. 3. Hepatic steatosis. 4. Small hiatal hernia. 5. Enlarged prostate.  02/17/2023 Urine culture is negative.   02/23/2023 CT abdomen pelvis w contrast showed  1. No acute findings are noted in the abdomen or pelvis to account for the patient's symptoms. 2. However, there is a persistently abnormal appearance of the kidneys bilaterally, with multiple persistent hypovascular lesions, the appearance of which raises concern for potential metastatic disease to the kidneys or renal lymphoma. Further clinical evaluation is recommended. 3. Aortic atherosclerosis.    The patient reported experiencing acute right-sided abdominal and back pain, severe enough to warrant an ambulance call. The pain was described as radiating from the lower back and into the upper abdomen. Accompanying these symptoms were a feverish feeling and night sweats, which had been occurring daily for the past week. The patient also reported a loss of appetite, leading to unintentional weight loss.  The patient has a history of similar, albeit less severe, symptoms, including back pain and night sweats. Earlier in the year, the patient was treated for a kidney infection. The patient also reported changes in bowel movements, with recent episodes of constipation followed by soft, orange-colored stools.  The patient uses ibuprofen and acetaminophen for pain management. The patient denied any recreational drug use but did report using CBD products.   MEDICAL HISTORY:  Past Medical History:  Diagnosis Date   Abnormal liver function test    Anxiety    Anxiety    Arthritis    Chronic lower back pain    Degenerative joint disease    Depression    Diabetes mellitus without complication (HCC)    Fatty liver    Heart murmur    Hepatitis B    Hyperlipidemia    Hypertension    Lumbar polyradiculopathy    Panic attacks    Polyneuropathy    Vertigo 05/03/2017   ER    SURGICAL HISTORY: Past Surgical History:  Procedure Laterality Date   COLONOSCOPY     COLONOSCOPY WITH PROPOFOL N/A 10/01/2017   Procedure: COLONOSCOPY WITH PROPOFOL;   Surgeon: Toledo, Boykin Nearing, MD;  Location: [facility] ENDOSCOPY;  Service: Endoscopy;  Laterality: N/A;   PROSTATE BIOPSY      SOCIAL HISTORY: Social History   Socioeconomic History   Marital status: Single    Spouse name: denise   Number of children: 0   Years of education: Not on file   Highest education level: 11th grade  Occupational History    Comment: disabled  Tobacco Use   Smoking status: Never   Smokeless tobacco: Never  Vaping Use   Vaping status: Never Used  Substance and Sexual Activity   Alcohol use: No   Drug use: Not Currently    Types: Marijuana   Sexual activity: Not Currently  Other Topics Concern   Not on file  Social History Narrative   Not on file  Social Determinants of Health   Financial Resource Strain: Patient Declined (11/15/2022)   Received from [facility]   Overall Financial Resource Strain (CARDIA)    Difficulty of Paying Living Expenses: Patient declined  Food Insecurity: No Food Insecurity (02/26/2023)   Hunger Vital Sign    Worried About Running Out of Food in the Last Year: Never true    Ran Out of Food in the Last Year: Never true  Transportation Needs: No Transportation Needs (02/26/2023)   PRAPARE - Administrator, Civil Service (Medical): No    Lack of Transportation (Non-Medical): No  Physical Activity: Inactive (03/19/2017)   Exercise Vital Sign    Days of Exercise per Week: 0 days    Minutes of Exercise per Session: 0 min  Stress: Not on file  Social Connections: Moderately Isolated (03/19/2017)   Social Connection and Isolation Panel [NHANES]    Frequency of Communication with Friends and Family: Twice a week    Frequency of Social Gatherings with Friends and Family: Never    Attends Religious Services: Never    Database administrator or Organizations: No    Attends Engineer, structural: Never    Marital Status: Living with partner  Intimate Partner Violence: Not At Risk (02/26/2023)    Humiliation, Afraid, Rape, and Kick questionnaire    Fear of Current or Ex-Partner: No    Emotionally Abused: No    Physically Abused: No    Sexually Abused: No    FAMILY HISTORY: Family History  Problem Relation Age of Onset   Cancer Mother        Pancreatic   Diabetes Mother    Hypertension Mother    Varicose Veins Mother    Alcohol abuse Mother    Pancreatic cancer Mother    Cancer Father        Jaw/ oral   Heart disease Father        CABG   Aortic aneurysm Father    Congestive Heart Failure Father    Hypertension Father    Varicose Veins Father    Alcohol abuse Father    Bipolar disorder Sister    Bipolar disorder Brother     ALLERGIES:  is allergic to lisinopril.  MEDICATIONS:  Current Outpatient Medications  Medication Sig Dispense Refill   amLODipine (NORVASC) 5 MG tablet Take 5 mg by mouth daily.     clotrimazole-betamethasone (LOTRISONE) cream Apply topically 2 (two) times daily.     Continuous Glucose Sensor (FREESTYLE LIBRE 3 SENSOR) MISC USE 1 SENSOR EVERY 14 DAYS     cyclobenzaprine (FLEXERIL) 5 MG tablet Take 1 tablet (5 mg total) by mouth 3 (three) times daily as needed. 12 tablet 0   glipiZIDE (GLUCOTROL XL) 5 MG 24 hr tablet Take 5 mg by mouth daily with breakfast.     hydrochlorothiazide (HYDRODIURIL) 25 MG tablet Take 25 mg by mouth daily.     hydrOXYzine (ATARAX) 25 MG tablet Take 1 tablet (25 mg total) by mouth every 8 (eight) hours as needed for anxiety. 90 tablet 1   lidocaine (LIDODERM) 5 % Place 1 patch onto the skin every 12 (twelve) hours. Remove & Discard patch within 12 hours or as directed by MD 10 patch 0   naproxen (NAPROSYN) 500 MG tablet TK 1 T PO BID WC  3   pravastatin (PRAVACHOL) 20 MG tablet Take 20 mg by mouth daily.     spironolactone (ALDACTONE) 25 MG tablet  tamsulosin (FLOMAX) 0.4 MG CAPS capsule TAKE 1 CAPSULE(0.4 MG) BY MOUTH DAILY 90 capsule 3   TRADJENTA 5 MG TABS tablet Take 5 mg by mouth daily.     traMADol (ULTRAM)  50 MG tablet Take 50 mg by mouth every 6 (six) hours as needed.     traZODone (DESYREL) 50 MG tablet Take 1 tablet (50 mg total) by mouth at bedtime as needed for sleep. Stop Quetiapine 90 tablet 1   valsartan (DIOVAN) 80 MG tablet Take 80 mg by mouth 2 (two) times daily.     No current facility-administered medications for this visit.    Review of Systems  Constitutional:  Positive for appetite change, fatigue and unexpected weight change. Negative for chills and fever.  HENT:   Negative for hearing loss and voice change.   Eyes:  Negative for eye problems.  Respiratory:  Negative for chest tightness and cough.   Cardiovascular:  Negative for chest pain.  Gastrointestinal:  Positive for abdominal pain. Negative for abdominal distention and blood in stool.  Endocrine: Negative for hot flashes.       Night sweats  Genitourinary:  Negative for difficulty urinating and frequency.   Musculoskeletal:  Negative for arthralgias.  Skin:  Negative for itching and rash.  Neurological:  Negative for extremity weakness.  Hematological:  Negative for adenopathy.  Psychiatric/Behavioral:  Negative for confusion.      PHYSICAL EXAMINATION: ECOG PERFORMANCE STATUS: 1 - Symptomatic but completely ambulatory  Vitals:   02/26/23 1508  BP: (!) 140/77  Pulse: 93  Resp: 18  Temp: 99.9 F (37.7 C)   Filed Weights   02/26/23 1508  Weight: 263 lb 12.8 oz (119.7 kg)    Physical Exam Constitutional:      General: Kyle Hood is not in acute distress.    Appearance: Kyle Hood is obese. Kyle Hood is not diaphoretic.  HENT:     Head: Normocephalic and atraumatic.  Eyes:     General: No scleral icterus. Cardiovascular:     Rate and Rhythm: Normal rate and regular rhythm.  Pulmonary:     Effort: Pulmonary effort is normal. No respiratory distress.     Breath sounds: No wheezing.  Abdominal:     General: Bowel sounds are normal. There is no distension.     Palpations: Abdomen is soft.     Tenderness: There is  abdominal tenderness.  Musculoskeletal:        General: Normal range of motion.     Cervical back: Normal range of motion and neck supple.  Skin:    General: Skin is warm and dry.     Findings: No erythema.  Neurological:     Mental Status: Kyle Hood is alert and oriented to person, place, and time. Mental status is at baseline.     Cranial Nerves: No cranial nerve deficit.     Motor: No abnormal muscle tone.  Psychiatric:        Mood and Affect: Mood and affect normal.      LABORATORY DATA:  I have reviewed the data as listed    Latest Ref Rng & Units 02/26/2023    4:00 PM 02/23/2023    8:09 AM 02/17/2023    1:15 PM  CBC  WBC 4.0 - 10.5 K/uL 6.1  9.5  9.5   Hemoglobin 13.0 - 17.0 g/dL 02.7  25.3  66.4   Hematocrit 39.0 - 52.0 % 36.8  39.4  38.9   Platelets 150 - 400 K/uL 134  149  148  Latest Ref Rng & Units 02/26/2023    4:00 PM 02/23/2023    8:09 AM 02/17/2023    1:15 PM  CMP  Glucose 70 - 99 mg/dL 92  811  914   BUN 8 - 23 mg/dL 15  13  14    Creatinine 0.61 - 1.24 mg/dL 7.82  9.56  2.13   Sodium 135 - 145 mmol/L 133  131  128   Potassium 3.5 - 5.1 mmol/L 3.8  4.1  3.7   Chloride 98 - 111 mmol/L 101  99  98   CO2 22 - 32 mmol/L 22  21  22    Calcium 8.9 - 10.3 mg/dL 9.1  9.2  9.1   Total Protein 6.5 - 8.1 g/dL 7.4  8.0  7.9   Total Bilirubin 0.3 - 1.2 mg/dL 0.4  1.0  0.9   Alkaline Phos 38 - 126 U/L 74  80  83   AST 15 - 41 U/L 28  45  27   ALT 0 - 44 U/L 20  21  18       RADIOGRAPHIC STUDIES: I have personally reviewed the radiological images as listed and agreed with the findings in the report. CT ABDOMEN PELVIS W CONTRAST  Result Date: 02/23/2023 CLINICAL DATA:  67 year old male with history of right lower quadrant abdominal pain. EXAM: CT ABDOMEN AND PELVIS WITH CONTRAST TECHNIQUE: Multidetector CT imaging of the abdomen and pelvis was performed using the standard protocol following bolus administration of intravenous contrast. RADIATION DOSE REDUCTION: This  exam was performed according to the departmental dose-optimization program which includes automated exposure control, adjustment of the mA and/or kV according to patient size and/or use of iterative reconstruction technique. CONTRAST:  OMNIPAQUE IOHEXOL 300 MG/ML  SOLN COMPARISON:  CT of the abdomen and pelvis 02/17/2023. FINDINGS: Lower chest: Unremarkable. Hepatobiliary: No suspicious cystic or solid hepatic lesions. No intra or extrahepatic biliary ductal dilatation. Gallbladder is unremarkable in appearance. Pancreas: No pancreatic mass. No pancreatic ductal dilatation. No pancreatic or peripancreatic fluid collections or inflammatory changes. Spleen: Unremarkable. Adrenals/Urinary Tract: The appearance of the kidneys is once again unusual with multiple poorly defined hypovascular areas, grossly similar to the recent prior examination. These are not clearly simple cysts, and these do not appear particularly wedge-shaped (review of the patient's chart demonstrates no leukocytes in the urine on urinalysis 02/17/2023 at the time of the recently obtained CT examination), suggesting that these do not in fact represent perfusion abnormalities associated with pyelonephritis, and are rather concerning for infiltrative lesions, including potential metastatic lesions. The largest of these is noted in the interpolar region of the right kidney measuring up to 1.9 cm in diameter (axial image 39 of series 2). No hydroureteronephrosis. Urinary bladder is normal in appearance. Bilateral adrenal glands are normal in appearance. Stomach/Bowel: The appearance of the stomach is normal. No pathologic dilatation of small bowel or colon. Normal appendix. Vascular/Lymphatic: Atherosclerosis in the abdominal aorta and pelvic vasculature. No lymphadenopathy noted in the abdomen or pelvis. Reproductive: Prostate gland and seminal vesicles are unremarkable in appearance. Other: No significant volume of ascites.  No pneumoperitoneum.  Musculoskeletal: There are no aggressive appearing lytic or blastic lesions noted in the visualized portions of the skeleton. IMPRESSION: 1. No acute findings are noted in the abdomen or pelvis to account for the patient's symptoms. 2. However, there is a persistently abnormal appearance of the kidneys bilaterally, with multiple persistent hypovascular lesions, the appearance of which raises concern for potential metastatic disease to the kidneys  or renal lymphoma. Further clinical evaluation is recommended. 3. Aortic atherosclerosis. Electronically Signed   By: Trudie Reed M.D.   On: 02/23/2023 09:31   CT ABDOMEN PELVIS W CONTRAST  Result Date: 02/17/2023 CLINICAL DATA:  Upper abdominal pain. EXAM: CT ABDOMEN AND PELVIS WITH CONTRAST TECHNIQUE: Multidetector CT imaging of the abdomen and pelvis was performed using the standard protocol following bolus administration of intravenous contrast. RADIATION DOSE REDUCTION: This exam was performed according to the departmental dose-optimization program which includes automated exposure control, adjustment of the mA and/or kV according to patient size and/or use of iterative reconstruction technique. CONTRAST:  OMNIPAQUE IOHEXOL 300 MG/ML  SOLN COMPARISON:  None Available. FINDINGS: Lower chest: No basilar airspace disease or pleural effusion. Hepatobiliary: Diffusely decreased hepatic density typical of steatosis. No focal liver abnormality. Gallbladder physiologically distended, no calcified stone. No biliary dilatation. Pancreas: No ductal dilatation or inflammation. No evidence of pancreatic mass. Spleen: Normal in size without focal abnormality. Adrenals/Urinary Tract: Normal adrenal glands. Heterogeneous right renal enhancement with faint right perinephric edema. There is slight heterogeneous left renal enhancement in the upper pole to a lesser extent. No hydronephrosis. No visualized urolithiasis. Decompressed ureters. Mild bladder distension but no  wall thickening or perivesicular inflammation Stomach/Bowel: Small hiatal hernia. No bowel obstruction or inflammation. Normal appendix. Small to moderate volume of colonic stool. Sigmoid colon is redundant. Vascular/Lymphatic: No acute vascular findings. Normal caliber abdominal aorta. Mild atherosclerosis. Patent portal and splenic veins. There are multiple prominent lymph nodes in the central small bowel mesentery measuring up to 11 mm. Reproductive: Enlarged prostate spans 5.5 cm. Other: No free air, free fluid, or intra-abdominal fluid collection. Musculoskeletal: Lumbar spondylosis with spurring and lower lumbar facet hypertrophy. There is a prominent Schmorl's node involving the inferior endplate of L2. IMPRESSION: 1. Heterogeneous right renal enhancement with faint right perinephric edema, suspicious for pyelonephritis. There is slight heterogeneous left renal enhancement in the upper pole to a lesser extent. Recommend correlation with urinalysis. 2. Multiple prominent lymph nodes in the central small bowel mesentery, likely reactive, but nonspecific. Consider follow-up CT after a course of treatment to ensure resolution. 3. Hepatic steatosis. 4. Small hiatal hernia. 5. Enlarged prostate. Aortic Atherosclerosis (ICD10-I70.0). Electronically Signed   By: Narda Rutherford M.D.   On: 02/17/2023 21:34   DG Chest 2 View  Result Date: 02/16/2023 CLINICAL DATA:  Chest pain radiating to the abdomen and shoulders. EXAM: CHEST - 2 VIEW COMPARISON:  02/04/2021 FINDINGS: Normal heart size and pulmonary vascularity. No focal airspace disease or consolidation in the lungs. No blunting of costophrenic angles. No pneumothorax. Mediastinal contours appear intact. Degenerative changes in the spine. IMPRESSION: No active cardiopulmonary disease. Electronically Signed   By: Burman Nieves M.D.   On: 02/16/2023 02:27

## 2023-02-27 ENCOUNTER — Telehealth: Payer: Self-pay

## 2023-02-27 DIAGNOSIS — R93429 Abnormal radiologic findings on diagnostic imaging of unspecified kidney: Secondary | ICD-10-CM

## 2023-02-27 LAB — CBC WITH DIFFERENTIAL/PLATELET
Abs Immature Granulocytes: 0.37 10*3/uL — ABNORMAL HIGH (ref 0.00–0.07)
Basophils Relative: 0 %
Blasts: 0 %
Eosinophils Absolute: 0.2 10*3/uL (ref 0.0–0.5)
HCT: 36.8 % — ABNORMAL LOW (ref 39.0–52.0)
Hemoglobin: 12.6 g/dL — ABNORMAL LOW (ref 13.0–17.0)
Lymphocytes Relative: 28 %
Lymphocytes Relative: 3 %
Lymphs Abs: 1.7 10*3/uL (ref 0.7–4.0)
MCH: 30.7 pg (ref 26.0–34.0)
MCHC: 34.2 g/dL (ref 30.0–36.0)
MCV: 89.5 fL (ref 80.0–100.0)
Metamyelocytes Relative: 2 %
Metamyelocytes Relative: 0 10*3/uL (ref 0.0–0.1)
Monocytes Absolute: 0.2 10*3/uL (ref 0.1–1.0)
Monocytes Relative: 3 %
Monocytes Relative: 1 %
Myelocytes: 4 %
Neutrophils Relative %: 57 %
Other: 2 %
Platelets: 134 10*3/uL — ABNORMAL LOW (ref 150–400)
Promyelocytes Relative: 0 10*3/uL (ref 0.0–0.5)
RBC Morphology: 3.5 10*3/uL (ref 1.7–7.7)
RBC: 4.11 MIL/uL — ABNORMAL LOW (ref 4.22–5.81)
RDW: 13.2 % (ref 11.5–15.5)
Smear Review: DECREASED %
WBC: 6.1 10*3/uL (ref 4.0–10.5)
nRBC: 0 /100{WBCs}
nRBC: 0.8 % — ABNORMAL HIGH (ref 0.0–0.2)

## 2023-02-27 NOTE — Telephone Encounter (Signed)
Called pt, no answer. Left VM letting him know that Dr. Cathie Hoops is wanting a STAT MRI. Provided centralized scheduling number so he can call and get it scheduled.

## 2023-02-27 NOTE — Telephone Encounter (Signed)
-----   Message from Rickard Patience sent at 02/26/2023 10:39 PM EDT ----- Please arrange patient to get MRI abdomen w wo contrast STAT. Please put " renal protocol" in the comment. Thanks.   zy

## 2023-02-27 NOTE — Telephone Encounter (Signed)
Pt scheduled for MRI on 11/1.

## 2023-02-28 ENCOUNTER — Telehealth: Payer: Self-pay | Admitting: *Deleted

## 2023-02-28 ENCOUNTER — Other Ambulatory Visit: Payer: Self-pay

## 2023-02-28 ENCOUNTER — Ambulatory Visit
Admission: RE | Admit: 2023-02-28 | Discharge: 2023-02-28 | Disposition: A | Discharge status: Home / Self Care | Payer: Medicare HMO | Source: Ambulatory Visit | Attending: Oncology | Admitting: Oncology

## 2023-02-28 DIAGNOSIS — R93429 Abnormal radiologic findings on diagnostic imaging of unspecified kidney: Secondary | ICD-10-CM | POA: Insufficient documentation

## 2023-02-28 LAB — KAPPA/LAMBDA LIGHT CHAINS
Kappa free light chain: 16.5 mg/L (ref 3.3–19.4)
Kappa, lambda light chain ratio: 1.33 (ref 0.26–1.65)
Lambda free light chains: 12.4 mg/L (ref 5.7–26.3)

## 2023-02-28 MED ORDER — GADOBUTROL 1 MMOL/ML IV SOLN
10.0000 mL | Freq: Once | INTRAVENOUS | Status: AC | PRN
Start: 2023-02-28 — End: 2023-02-28
  Administered 2023-02-28: 10 mL via INTRAVENOUS

## 2023-02-28 NOTE — Telephone Encounter (Signed)
Called report  IMPRESSION: 1. Unusual, extensively heterogeneous appearance of the renal parenchyma best appreciated on diffusion-weighted sequences, and to a lesser extent early arterial phases of contrast enhancement. This appearance otherwise very difficult to appreciate on remaining sequences. No individual discrete lesion. This is of uncertain significance, and as previously reported and discussed, differential considerations would specifically include infiltrative processes such as renal lymphomatous involvement or metastatic disease. Multifocal pyelonephritis could also have this appearance if there are clinically referable signs and symptoms and urinalysis evidence. 2. Diffusely heterogeneous enhancement of the vertebral bone marrow, which is worrisome for osseous metastatic disease, particularly in light of findings described above. 3. Hepatic steatosis.   These results will be called to the ordering clinician or representative by the Radiologist Assistant, and communication documented in the PACS or [employer].   Aortic Atherosclerosis (ICD10-I70.0).     Electronically Signed   By: Jearld Lesch M.D.   On: 02/28/2023 13:43

## 2023-02-28 NOTE — Telephone Encounter (Signed)
Called pt, no answer. Left VM that MD is recommending PET.   Auth pending. Please inform pt of appt details once scheduled.

## 2023-03-03 LAB — COMP PANEL: LEUKEMIA/LYMPHOMA: Immunophenotypic Profile: 2

## 2023-03-04 LAB — MULTIPLE MYELOMA PANEL, SERUM
Albumin SerPl Elph-Mcnc: 3.2 g/dL (ref 2.9–4.4)
Albumin/Glob SerPl: 1 (ref 0.7–1.7)
Alpha 1: 0.4 g/dL (ref 0.0–0.4)
Alpha2 Glob SerPl Elph-Mcnc: 1.1 g/dL — ABNORMAL HIGH (ref 0.4–1.0)
B-Globulin SerPl Elph-Mcnc: 1.1 g/dL (ref 0.7–1.3)
Gamma Glob SerPl Elph-Mcnc: 0.8 g/dL (ref 0.4–1.8)
Globulin, Total: 3.4 g/dL (ref 2.2–3.9)
IgA: 322 mg/dL (ref 61–437)
IgG (Immunoglobin G), Serum: 948 mg/dL (ref 603–1613)
IgM (Immunoglobulin M), Srm: 46 mg/dL (ref 20–172)
Total Protein ELP: 6.6 g/dL (ref 6.0–8.5)

## 2023-03-06 ENCOUNTER — Other Ambulatory Visit: Payer: Self-pay

## 2023-03-06 ENCOUNTER — Encounter: Payer: Self-pay | Admitting: *Deleted

## 2023-03-06 ENCOUNTER — Emergency Department
Admission: EM | Admit: 2023-03-06 | Discharge: 2023-03-07 | Disposition: A | Discharge status: Home / Self Care | Payer: Medicare HMO | Attending: Emergency Medicine | Admitting: Emergency Medicine

## 2023-03-06 DIAGNOSIS — R918 Other nonspecific abnormal finding of lung field: Secondary | ICD-10-CM | POA: Insufficient documentation

## 2023-03-06 DIAGNOSIS — I700 Atherosclerosis of aorta: Secondary | ICD-10-CM | POA: Insufficient documentation

## 2023-03-06 DIAGNOSIS — I10 Essential (primary) hypertension: Secondary | ICD-10-CM | POA: Insufficient documentation

## 2023-03-06 DIAGNOSIS — R1084 Generalized abdominal pain: Secondary | ICD-10-CM | POA: Diagnosis not present

## 2023-03-06 DIAGNOSIS — M545 Low back pain, unspecified: Secondary | ICD-10-CM | POA: Diagnosis present

## 2023-03-06 DIAGNOSIS — N289 Disorder of kidney and ureter, unspecified: Secondary | ICD-10-CM | POA: Insufficient documentation

## 2023-03-06 DIAGNOSIS — R161 Splenomegaly, not elsewhere classified: Secondary | ICD-10-CM | POA: Insufficient documentation

## 2023-03-06 DIAGNOSIS — R93429 Abnormal radiologic findings on diagnostic imaging of unspecified kidney: Secondary | ICD-10-CM | POA: Diagnosis not present

## 2023-03-06 LAB — BASIC METABOLIC PANEL
Anion gap: 13 (ref 5–15)
BUN: 15 mg/dL (ref 8–23)
CO2: 22 mmol/L (ref 22–32)
Calcium: 8.9 mg/dL (ref 8.9–10.3)
Chloride: 95 mmol/L — ABNORMAL LOW (ref 98–111)
Creatinine, Ser: 0.98 mg/dL (ref 0.61–1.24)
GFR, Estimated: >60 mL/min (ref >60)
Glucose, Bld: 186 mg/dL — ABNORMAL HIGH (ref 70–99)
Potassium: 3.9 mmol/L (ref 3.5–5.1)
Sodium: 130 mmol/L — ABNORMAL LOW (ref 135–145)

## 2023-03-06 LAB — CBC
HCT: 36.4 % — ABNORMAL LOW (ref 39.0–52.0)
Hemoglobin: 12.4 g/dL — ABNORMAL LOW (ref 13.0–17.0)
MCH: 30.7 pg (ref 26.0–34.0)
MCHC: 34.1 g/dL (ref 30.0–36.0)
MCV: 90.1 fL (ref 80.0–100.0)
Platelets: 44 10*3/uL — ABNORMAL LOW (ref 150–400)
RBC: 4.04 MIL/uL — ABNORMAL LOW (ref 4.22–5.81)
RDW: 13.2 % (ref 11.5–15.5)
WBC: 7.9 10*3/uL (ref 4.0–10.5)
nRBC: 2.2 % — ABNORMAL HIGH (ref 0.0–0.2)

## 2023-03-06 LAB — LIPASE, BLOOD: Lipase: 31 U/L (ref 11–51)

## 2023-03-06 LAB — TROPONIN I (HIGH SENSITIVITY): Troponin I (High Sensitivity): 6 ng/L (ref <18)

## 2023-03-06 NOTE — ED Triage Notes (Addendum)
Pt brought in via ems from home.  Pt has back pain and abd pain.  Pt reports he is having a pet scan tomorrow. Pt reports pain is worse today.  Pt alert  speech clear.  Iv in place.   Mri done 02/28/23

## 2023-03-07 ENCOUNTER — Ambulatory Visit
Admission: RE | Admit: 2023-03-07 | Discharge: 2023-03-07 | Disposition: A | Discharge status: Home / Self Care | Payer: Medicare HMO | Source: Ambulatory Visit | Attending: Oncology | Admitting: Oncology

## 2023-03-07 ENCOUNTER — Emergency Department: Payer: Medicare HMO

## 2023-03-07 DIAGNOSIS — M545 Low back pain, unspecified: Secondary | ICD-10-CM | POA: Diagnosis not present

## 2023-03-07 DIAGNOSIS — I700 Atherosclerosis of aorta: Secondary | ICD-10-CM | POA: Insufficient documentation

## 2023-03-07 DIAGNOSIS — R93429 Abnormal radiologic findings on diagnostic imaging of unspecified kidney: Secondary | ICD-10-CM | POA: Insufficient documentation

## 2023-03-07 DIAGNOSIS — N289 Disorder of kidney and ureter, unspecified: Secondary | ICD-10-CM | POA: Insufficient documentation

## 2023-03-07 DIAGNOSIS — R161 Splenomegaly, not elsewhere classified: Secondary | ICD-10-CM | POA: Insufficient documentation

## 2023-03-07 DIAGNOSIS — R918 Other nonspecific abnormal finding of lung field: Secondary | ICD-10-CM | POA: Insufficient documentation

## 2023-03-07 LAB — MAGNESIUM: Magnesium: 1.9 mg/dL (ref 1.7–2.4)

## 2023-03-07 LAB — HEPATIC FUNCTION PANEL
ALT: 20 U/L (ref 0–44)
AST: 45 U/L — ABNORMAL HIGH (ref 15–41)
Albumin: 4 g/dL (ref 3.5–5.0)
Alkaline Phosphatase: 99 U/L (ref 38–126)
Bilirubin, Direct: 0.2 mg/dL (ref 0.0–0.2)
Indirect Bilirubin: 0.8 mg/dL (ref 0.3–0.9)
Total Bilirubin: 1 mg/dL (ref <1.2)
Total Protein: 8 g/dL (ref 6.5–8.1)

## 2023-03-07 LAB — URINALYSIS, ROUTINE W REFLEX MICROSCOPIC
Bilirubin Urine: NEGATIVE
Glucose, UA: NEGATIVE mg/dL
Hgb urine dipstick: NEGATIVE
Ketones, ur: 5 mg/dL — AB
Leukocytes,Ua: NEGATIVE
Nitrite: NEGATIVE
Protein, ur: NEGATIVE mg/dL
Specific Gravity, Urine: 1.02 (ref 1.005–1.030)
pH: 5 (ref 5.0–8.0)

## 2023-03-07 LAB — PHOSPHORUS: Phosphorus: 4.2 mg/dL (ref 2.5–4.6)

## 2023-03-07 LAB — GLUCOSE, CAPILLARY: Glucose-Capillary: 173 mg/dL — ABNORMAL HIGH (ref 70–99)

## 2023-03-07 MED ORDER — HYDROMORPHONE HCL 1 MG/ML IJ SOLN
1.0000 mg | Freq: Once | INTRAMUSCULAR | Status: AC
  Administered 2023-03-07: 1 mg via INTRAVENOUS
  Filled 2023-03-07: qty 1

## 2023-03-07 MED ORDER — IOHEXOL 350 MG/ML SOLN
100.0000 mL | Freq: Once | INTRAVENOUS | Status: AC | PRN
Start: 2023-03-07 — End: 2023-03-07
  Administered 2023-03-07: 100 mL via INTRAVENOUS

## 2023-03-07 MED ORDER — LACTATED RINGERS IV BOLUS
1000.0000 mL | Freq: Once | INTRAVENOUS | Status: AC
  Administered 2023-03-07: 1000 mL via INTRAVENOUS

## 2023-03-07 MED ORDER — FLUDEOXYGLUCOSE F - 18 (FDG) INJECTION
12.0000 | Freq: Once | INTRAVENOUS | Status: AC | PRN
Start: 2023-03-07 — End: 2023-03-07
  Administered 2023-03-07: 12.69 via INTRAVENOUS

## 2023-03-07 MED ORDER — OXYCODONE-ACETAMINOPHEN 5-325 MG PO TABS
1.0000 | ORAL_TABLET | Freq: Four times a day (QID) | ORAL | 0 refills | Status: DC | PRN
Start: 2023-03-07 — End: 2023-03-10

## 2023-03-07 MED ORDER — ONDANSETRON HCL 4 MG/2ML IJ SOLN
4.0000 mg | Freq: Once | INTRAMUSCULAR | Status: AC
  Administered 2023-03-07: 4 mg via INTRAVENOUS
  Filled 2023-03-07: qty 2

## 2023-03-07 NOTE — ED Provider Notes (Signed)
[facility] Provider Note    Event Date/Time   First MD Initiated Contact with Patient 03/07/23 0014     (approximate)   History   Chief Complaint: Back Pain   HPI  Kyle Hood is a 67 y.o. adult with a history of hypertension who comes ED complaining of generalized abdominal pain worse in the right lower quadrant and groin as well as in the lower back.  This feels similar to pain he has had recently for the past week or 2, but more intense.  Has loss of appetite.  He is drinking fluids okay.  No diarrhea.  No dysuria.  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  He has a PET scan scheduled yesterday, being evaluated by oncology.          Physical Exam   Triage Vital Signs: ED Triage Vitals  Encounter Vitals Group     BP 03/06/23 1842 (!) 149/78     Systolic BP Percentile --      Diastolic BP Percentile --      Pulse Rate 03/06/23 1837 (!) 104     Resp 03/06/23 1837 (!) 22     Temp 03/06/23 1837 98.5 F (36.9 C)     Temp Source 03/06/23 1837 Oral     SpO2 03/06/23 1837 97 %     Weight 03/06/23 1834 260 lb (117.9 kg)     Height 03/06/23 1834 6\' 1"  (1.854 m)     Head Circumference --      Peak Flow --      Pain Score 03/06/23 1834 10     Pain Loc --      Pain Education --      Exclude from Growth Chart --     Most recent vital signs: Vitals:   03/07/23 0054 03/07/23 0200  BP: (!) 158/74 (!) 160/77  Pulse: 95 90  Resp: 14 14  Temp:    SpO2: 97% 98%    General: Awake, no distress.  CV:  Good peripheral perfusion.  Normal distal pulses Resp:  Normal effort.  Clear to auscultation bilaterally Abd:  Mildly distended.  Diffuse tenderness. Other:  Moist oral mucosa.  No midline spinal tenderness.  There is tenderness in the right lower back musculature reproducing his back pain   ED  Results / Procedures / Treatments   Labs (all labs ordered are listed, but only abnormal results are displayed) Labs Reviewed  BASIC METABOLIC PANEL - Abnormal; Notable for the following components:      Result Value   Sodium 130 (*)    Chloride 95 (*)    Glucose, Bld 186 (*)    All other components within normal limits  CBC - Abnormal; Notable for the following components:   RBC 4.04 (*)    Hemoglobin 12.4 (*)    HCT 36.4 (*)    Platelets 44 (*)    nRBC 2.2 (*)    All other components within normal limits  URINALYSIS, ROUTINE W REFLEX MICROSCOPIC - Abnormal; Notable for the following components:   Color, Urine YELLOW (*)    APPearance CLEAR (*)    Ketones, ur 5 (*)    All other components within normal limits  HEPATIC FUNCTION PANEL - Abnormal; Notable for the following components:   AST 45 (*)    All other components within  normal limits  LIPASE, BLOOD  MAGNESIUM  PHOSPHORUS  TROPONIN I (HIGH SENSITIVITY)     EKG    RADIOLOGY CT abdomen pelvis interpreted by me, negative for free air or bowel obstruction.  Radiology report reviewed   PROCEDURES:  Procedures   MEDICATIONS ORDERED IN ED: Medications  ondansetron (ZOFRAN) injection 4 mg (4 mg Intravenous Given 03/07/23 0050)  HYDROmorphone (DILAUDID) injection 1 mg (1 mg Intravenous Given 03/07/23 0050)  lactated ringers bolus 1,000 mL (1,000 mLs Intravenous New Bag/Given 03/07/23 0050)  iohexol (OMNIPAQUE) 350 MG/ML injection 100 mL (100 mLs Intravenous Contrast Given 03/07/23 0108)     IMPRESSION / MDM / ASSESSMENT AND PLAN / ED COURSE  I reviewed the triage vital signs and the nursing notes.  DDx: AKI, electrolyte abnormality, bowel perforation, bowel obstruction, hernia, renal infarction, lumbar muscle spasm.  Doubt AAA, aortic dissection, mesenteric ischemia.  Patient's presentation is most consistent with acute presentation with potential threat to life or bodily function.  Patient presents with back  pain which appears to be muscular.  Also has generalized abdominal pain with tenderness and distention.  Will obtain CT scan tonight.  Will give IV Dilaudid for pain relief.   ----------------------------------------- 2:55 AM on 03/07/2023 ----------------------------------------- Patient reports pain is resolved.  Workup is reassuring, stable findings from previous.  Has PET scan scheduled for later this morning.  Advised to discard the tramadol that he has and I will send a prescription for oxycodone for improved pain control.      FINAL CLINICAL IMPRESSION(S) / ED DIAGNOSES   Final diagnoses:  Acute bilateral low back pain without sciatica     Rx / DC Orders   ED Discharge Orders          Ordered    oxyCODONE-acetaminophen (PERCOCET) 5-325 MG tablet  Every 6 hours PRN        03/07/23 0255             Note:  This document was prepared using Dragon voice recognition software and may include unintentional dictation errors.   Sharman Cheek, MD 03/07/23 [phone]

## 2023-03-10 ENCOUNTER — Other Ambulatory Visit: Payer: Self-pay | Admitting: *Deleted

## 2023-03-10 ENCOUNTER — Telehealth: Payer: Self-pay

## 2023-03-10 ENCOUNTER — Other Ambulatory Visit: Payer: Self-pay | Admitting: Oncology

## 2023-03-10 DIAGNOSIS — R93429 Abnormal radiologic findings on diagnostic imaging of unspecified kidney: Secondary | ICD-10-CM

## 2023-03-10 DIAGNOSIS — D696 Thrombocytopenia, unspecified: Secondary | ICD-10-CM

## 2023-03-10 MED ORDER — OXYCODONE-ACETAMINOPHEN 5-325 MG PO TABS: 1.0000 | ORAL_TABLET | ORAL | 0 refills | Status: DC | PRN

## 2023-03-10 NOTE — Telephone Encounter (Signed)
Call returned to patient and informed that prescription has been sent and that Dr Cathie Hoops wants to get a biopsy of his rib and to expect a call form our office concerning date and time for this to  be done. He thanked me for letting him know

## 2023-03-10 NOTE — Telephone Encounter (Signed)
PEr Dr. Cathie Hoops:  "please let him know that after reviewing his PET, I recommend CT guided biopsy of his first rib lesion ASAP and also a bone marrow biopsy. I see him 1 week after rib lesion biopsy."

## 2023-03-10 NOTE — Telephone Encounter (Signed)
Request for CT and bmBx faxed to IR

## 2023-03-10 NOTE — Telephone Encounter (Signed)
Patient called reporting that he went to ER with excruciatig pain and was given Oxycodone for it and he does not think his PCP will refill it for him so he is asking if Dr Cathie Hoops would refill it for him. Please advise

## 2023-03-12 NOTE — Progress Notes (Signed)
Gilmer Mor, DO sent to [facility]; [name] Procedure Requests OK for CT guided right kidney lesion biopsy.  Just discussed with Dr. Cathie Hoops. 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 MR and CT images show lesions of the kidneys.  Might need IV contrast dose during biopsy.  For Dr. Loreta Ave day.  Loreta Ave

## 2023-03-14 NOTE — Telephone Encounter (Signed)
Pt accepted appointment for biopsies on 11/26 arrive 9a for 10a procedure.   Please schedule MD only (results) on Tues 12/3 @ 2:45. Pt aware of appts.   Please mail appts.

## 2023-03-21 ENCOUNTER — Inpatient Hospital Stay: Payer: Medicare HMO | Attending: Oncology | Admitting: Hospice and Palliative Medicine

## 2023-03-21 ENCOUNTER — Telehealth: Payer: Self-pay | Admitting: *Deleted

## 2023-03-21 ENCOUNTER — Other Ambulatory Visit: Payer: Self-pay | Admitting: *Deleted

## 2023-03-21 DIAGNOSIS — C801 Malignant (primary) neoplasm, unspecified: Secondary | ICD-10-CM | POA: Insufficient documentation

## 2023-03-21 DIAGNOSIS — G893 Neoplasm related pain (acute) (chronic): Secondary | ICD-10-CM

## 2023-03-21 DIAGNOSIS — C7951 Secondary malignant neoplasm of bone: Secondary | ICD-10-CM | POA: Insufficient documentation

## 2023-03-21 DIAGNOSIS — R93429 Abnormal radiologic findings on diagnostic imaging of unspecified kidney: Secondary | ICD-10-CM | POA: Diagnosis not present

## 2023-03-21 DIAGNOSIS — D696 Thrombocytopenia, unspecified: Secondary | ICD-10-CM

## 2023-03-21 DIAGNOSIS — N289 Disorder of kidney and ureter, unspecified: Secondary | ICD-10-CM | POA: Insufficient documentation

## 2023-03-21 MED ORDER — MORPHINE SULFATE ER 15 MG PO TBCR: 15.0000 mg | EXTENDED_RELEASE_TABLET | Freq: Two times a day (BID) | ORAL | 0 refills | Status: DC

## 2023-03-21 MED ORDER — OXYCODONE-ACETAMINOPHEN 5-325 MG PO TABS: 1.0000 | ORAL_TABLET | ORAL | Status: DC | PRN

## 2023-03-21 MED ORDER — OXYCODONE HCL ER 10 MG PO T12A: 10.0000 mg | EXTENDED_RELEASE_TABLET | Freq: Two times a day (BID) | ORAL | 0 refills | Status: DC

## 2023-03-21 NOTE — Telephone Encounter (Signed)
Per Sharia Reeve- pt c/o hemoptysis. Last plt count was 44. Plan to bring pt in clinic on Monday for lab/ smc/ iv fluid clinic for possible iv steroids. Patient has planned bx-bone marrow and renal bx scheduled on Tuesday. Concern that if plt count is a critical value, then pt may need platelet transfusion. Unfortantely, there is no infusion chair availability until next Wed 11/27 for possible plt counts. If pt's counts are critical, then pt will need to go to the ER for further plt transfusion. I have left a detailed vm for patient requesting a call back to provide additional apts.

## 2023-03-21 NOTE — Addendum Note (Signed)
Addended by: Laurette Schimke R on: 03/21/2023 05:09 PM   Modules accepted: Orders

## 2023-03-21 NOTE — Telephone Encounter (Addendum)
Patient called reporting that his pain is worse in his low back and is now going down into his hips and is having difficulty walking. He states that he is taking his Oxycodone  2 tabs every 6 hours for the pain ( ordered 1 tablet every 4 hours as needed), but is still hurting. Please advise

## 2023-03-21 NOTE — Progress Notes (Addendum)
Virtual Visit via Telephone Note  I connected with [name] on 03/21/23 at  2:00 PM EST by telephone and verified that I am speaking with the correct person using two identifiers.  Location: Patient: Home Provider: Clinic   I discussed the limitations, risks, security and privacy concerns of performing an evaluation and management service by telephone and the availability of in person appointments. I also discussed with the patient that there may be a patient responsible charge related to this service. The patient expressed understanding and agreed to proceed.   History of Present Illness: Kyle Hood is a 67 year old man recently found to have infiltrative kidney lesions concerning for metastatic disease also affecting the lumbar spine.  Patient currently undergoing workup.   Observations/Objective: Patient seen in the emergency department 03/07/2023 with back pain.  Imaging appeared consistent with known, probable metastatic disease.  Patient received prescription for oxycodone.  I called and spoke with patient by phone.  Patient reports severe and persistent generalized bony pain, worse with ambulation.  He is able to bear weight readily.  Denies falls or trauma.  Patient is taking Percocet 2 tablets every 6 hours.  He rates pain as 10 out of 10 at its most severe and will reduce to 7-8 out of 10 after taking the Percocet.  However, patient states that the Percocet is wearing off after about 5 hours.  Patient says that he is tolerating the Percocet well.  Denies adverse effects.  He is taking a bowel regimen to prevent constipation.  Discussed the differences between short acting and long-acting opioids.  Will start OxyContin and continue Percocet as needed for breakthrough pain.  Patient reports that he has had 1 or 2 episodes of scant blood when he coughs.  Denies bleeding elsewhere.  Denies fever or chills.  No shortness of breath or chest pain.  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 Will start MS Contin 15mg  Q12H (Insurance would not approve OxyContin). Liberalize Percocet. PDMP reviewed.   Hemoptysis -scant/trace amount of bleeding when coughing.  Suspect persistent thrombocytopenia, likely due to marrow infiltration.  Discussed with Dr. Cathie Hoops and will bring patient in next week for labs/possible platelets.  Discussed ED triggers in detail with patient.  Patient says that he would prefer to avoid the emergency department.  Follow Up Instructions: Next week   I discussed the assessment and treatment plan with the patient. The patient was provided an opportunity to ask questions and all were answered. The patient agreed with the plan and demonstrated an understanding of the instructions.   The patient was advised to call back or seek an in-person evaluation if the symptoms worsen or if the condition fails to improve as anticipated.  I provided 15 minutes of non-face-to-face time during this encounter.   Malachy Moan, NP

## 2023-03-21 NOTE — Telephone Encounter (Signed)
[name] (Key: [id number]) OxyCONTIN 10MG  er tablets status: NewCreated: November 22nd, 2024 PA submitted for patient's pain med  Per prelim. PA- looks like high probability that script will be denied by insurance- as preferred covered formulary alternatives are as follows: morphine sulfate ER tablet or ER capsule (generic Kadian, Avinza).  New PA submitted for Morphine Sulfate [name] (Key: [id number]) PA Case ID #: [id number] Need Help? Call us at [phone] Status sent iconSent to Plan today Drug-Morphine Sulfate ER 15MG  er tablets   [name] (Key: [id number]) - [id number] Morphine Sulfate ER 15MG  er tablets status: PA Response - ApprovedCreated: November 22nd, 2024Sent: November 22nd, 2024

## 2023-03-21 NOTE — Telephone Encounter (Signed)
Per v/o Josh- ok to chg to telephone visit to discuss pain mgmt. Pt called and he was very grateful for the phone call instead of in person. This will help he and his partner not to have to come over to the clinic. His partner will go get any medications from the pharmacy on behalf of patient. Pt had me change the pharmacy to [facility] [address] [facility] in [place], [place] as default pharmacy.

## 2023-03-21 NOTE — Telephone Encounter (Signed)
New script for the Morphine Sulfate 15 mg Er sent to pharmacy. Morphine sulfate approved by insurance. I personally contacted the pharmacy to let the pharmacist know to cnl the script for the oxycontin ER as insurance will not approve it.    I personally spoke with [name] patient's significant other/caregiver. Discussed apts for Monday.  [name] will bring pt to the apts. Unable to reach pt directly. Pt has his cell phone off- charging per [employer]. He will discuss the apt time with [name] and let him know about the new prescriptions being sent to pharmacy. [name] [facility] [address] [name] understands that Patient's oxycontin ER not approved by insurance.   [name] stated that patient's appetite has been poor as well. Pt did eat black eyed peas and chicken last evening. He was able to drink boost/ensure. I discussed that importance of hydration given the added long acting narcotic. Pt has miralax at home to use as needed. Last time pt had to use milk of mag to have a bm. Caregiver will make sure pt has plenty of water/gatarade etx. RN also introduced the supportive care services- smc team in cancer center. Discussed that we can place a dietary referral and Dietician - Joli can meet with patient.  [name] thanked me for calling him directly and reviewing the plan of care. He will communicate everything to the patient. [name] will have pt at apt here in cancer center at 1015 on Monday as planned. He understands that if pt has anymore hemoptysis or bleeding elsewhere that pt should go to the ER to have plt count checked.

## 2023-03-23 ENCOUNTER — Other Ambulatory Visit (HOSPITAL_COMMUNITY): Payer: Self-pay | Admitting: Radiology

## 2023-03-23 DIAGNOSIS — D696 Thrombocytopenia, unspecified: Secondary | ICD-10-CM

## 2023-03-24 ENCOUNTER — Telehealth: Payer: Self-pay | Admitting: *Deleted

## 2023-03-24 ENCOUNTER — Telehealth: Payer: Self-pay

## 2023-03-24 ENCOUNTER — Other Ambulatory Visit: Payer: Medicare HMO

## 2023-03-24 ENCOUNTER — Inpatient Hospital Stay (HOSPITAL_BASED_OUTPATIENT_CLINIC_OR_DEPARTMENT_OTHER): Payer: Medicare HMO | Admitting: Hospice and Palliative Medicine

## 2023-03-24 ENCOUNTER — Other Ambulatory Visit: Payer: Self-pay

## 2023-03-24 ENCOUNTER — Inpatient Hospital Stay: Payer: Medicare HMO

## 2023-03-24 ENCOUNTER — Inpatient Hospital Stay
Admission: EM | Admit: 2023-03-24 | Discharge: 2023-03-26 | DRG: 813 | Disposition: A | Discharge status: Home / Self Care | Payer: Medicare HMO | Attending: Internal Medicine | Admitting: Internal Medicine

## 2023-03-24 ENCOUNTER — Encounter: Payer: Self-pay | Admitting: Hospice and Palliative Medicine

## 2023-03-24 VITALS — Temp 99.8°F | Resp 18 | Ht 73.0 in | Wt 260.0 lb

## 2023-03-24 DIAGNOSIS — C799 Secondary malignant neoplasm of unspecified site: Secondary | ICD-10-CM | POA: Diagnosis present

## 2023-03-24 DIAGNOSIS — N289 Disorder of kidney and ureter, unspecified: Secondary | ICD-10-CM | POA: Diagnosis present

## 2023-03-24 DIAGNOSIS — R93429 Abnormal radiologic findings on diagnostic imaging of unspecified kidney: Secondary | ICD-10-CM | POA: Diagnosis not present

## 2023-03-24 DIAGNOSIS — C8599 Non-Hodgkin lymphoma, unspecified, extranodal and solid organ sites: Secondary | ICD-10-CM | POA: Diagnosis present

## 2023-03-24 DIAGNOSIS — E66811 Obesity, class 1: Secondary | ICD-10-CM | POA: Diagnosis present

## 2023-03-24 DIAGNOSIS — Z811 Family history of alcohol abuse and dependence: Secondary | ICD-10-CM

## 2023-03-24 DIAGNOSIS — D72819 Decreased white blood cell count, unspecified: Secondary | ICD-10-CM | POA: Diagnosis present

## 2023-03-24 DIAGNOSIS — Z818 Family history of other mental and behavioral disorders: Secondary | ICD-10-CM

## 2023-03-24 DIAGNOSIS — D696 Thrombocytopenia, unspecified: Principal | ICD-10-CM | POA: Diagnosis present

## 2023-03-24 DIAGNOSIS — Z8619 Personal history of other infectious and parasitic diseases: Secondary | ICD-10-CM

## 2023-03-24 DIAGNOSIS — Z888 Allergy status to other drugs, medicaments and biological substances status: Secondary | ICD-10-CM

## 2023-03-24 DIAGNOSIS — M545 Low back pain, unspecified: Secondary | ICD-10-CM | POA: Diagnosis present

## 2023-03-24 DIAGNOSIS — C801 Malignant (primary) neoplasm, unspecified: Secondary | ICD-10-CM | POA: Diagnosis present

## 2023-03-24 DIAGNOSIS — Z7984 Long term (current) use of oral hypoglycemic drugs: Secondary | ICD-10-CM

## 2023-03-24 DIAGNOSIS — D649 Anemia, unspecified: Secondary | ICD-10-CM

## 2023-03-24 DIAGNOSIS — G8929 Other chronic pain: Secondary | ICD-10-CM | POA: Diagnosis present

## 2023-03-24 DIAGNOSIS — Z808 Family history of malignant neoplasm of other organs or systems: Secondary | ICD-10-CM

## 2023-03-24 DIAGNOSIS — E785 Hyperlipidemia, unspecified: Secondary | ICD-10-CM | POA: Diagnosis present

## 2023-03-24 DIAGNOSIS — Z555 Less than a high school diploma: Secondary | ICD-10-CM

## 2023-03-24 DIAGNOSIS — Z8249 Family history of ischemic heart disease and other diseases of the circulatory system: Secondary | ICD-10-CM

## 2023-03-24 DIAGNOSIS — Z6834 Body mass index (BMI) 34.0-34.9, adult: Secondary | ICD-10-CM

## 2023-03-24 DIAGNOSIS — Z79899 Other long term (current) drug therapy: Secondary | ICD-10-CM

## 2023-03-24 DIAGNOSIS — E669 Obesity, unspecified: Secondary | ICD-10-CM | POA: Diagnosis present

## 2023-03-24 DIAGNOSIS — I10 Essential (primary) hypertension: Secondary | ICD-10-CM | POA: Diagnosis present

## 2023-03-24 DIAGNOSIS — Z800 Family history of malignant neoplasm of digestive organs: Secondary | ICD-10-CM

## 2023-03-24 DIAGNOSIS — N2889 Other specified disorders of kidney and ureter: Secondary | ICD-10-CM | POA: Diagnosis present

## 2023-03-24 DIAGNOSIS — Z9889 Other specified postprocedural states: Secondary | ICD-10-CM

## 2023-03-24 DIAGNOSIS — K760 Fatty (change of) liver, not elsewhere classified: Secondary | ICD-10-CM | POA: Diagnosis present

## 2023-03-24 DIAGNOSIS — E119 Type 2 diabetes mellitus without complications: Secondary | ICD-10-CM

## 2023-03-24 DIAGNOSIS — Z833 Family history of diabetes mellitus: Secondary | ICD-10-CM

## 2023-03-24 DIAGNOSIS — F3341 Major depressive disorder, recurrent, in partial remission: Secondary | ICD-10-CM | POA: Diagnosis present

## 2023-03-24 DIAGNOSIS — C7951 Secondary malignant neoplasm of bone: Secondary | ICD-10-CM | POA: Diagnosis present

## 2023-03-24 DIAGNOSIS — E871 Hypo-osmolality and hyponatremia: Secondary | ICD-10-CM | POA: Diagnosis present

## 2023-03-24 LAB — CMP (CANCER CENTER ONLY)
ALT: 16 U/L (ref 0–44)
AST: 33 U/L (ref 15–41)
Albumin: 3.2 g/dL — ABNORMAL LOW (ref 3.5–5.0)
Alkaline Phosphatase: 104 U/L (ref 38–126)
Anion gap: 11 (ref 5–15)
BUN: 22 mg/dL (ref 8–23)
CO2: 24 mmol/L (ref 22–32)
Calcium: 8.5 mg/dL — ABNORMAL LOW (ref 8.9–10.3)
Chloride: 95 mmol/L — ABNORMAL LOW (ref 98–111)
Creatinine: 1.15 mg/dL (ref 0.61–1.24)
GFR, Estimated: >60 mL/min
Glucose, Bld: 191 mg/dL — ABNORMAL HIGH (ref 70–99)
Potassium: 4.2 mmol/L (ref 3.5–5.1)
Sodium: 130 mmol/L — ABNORMAL LOW (ref 135–145)
Total Bilirubin: 0.6 mg/dL
Total Protein: 6.9 g/dL (ref 6.5–8.1)

## 2023-03-24 LAB — CBC
HCT: 23.9 % — ABNORMAL LOW (ref 39.0–52.0)
Hemoglobin: 8.2 g/dL — ABNORMAL LOW (ref 13.0–17.0)
MCH: 30.5 pg (ref 26.0–34.0)
MCHC: 34.3 g/dL (ref 30.0–36.0)
MCV: 88.8 fL (ref 80.0–100.0)
Platelets: 7 10*3/uL — CL (ref 150–400)
RBC: 2.69 MIL/uL — ABNORMAL LOW (ref 4.22–5.81)
RDW: 14.6 % (ref 11.5–15.5)
WBC: 4.4 10*3/uL (ref 4.0–10.5)
nRBC: 2.5 % — ABNORMAL HIGH (ref 0.0–0.2)

## 2023-03-24 LAB — PROTIME-INR
INR: 1.2 (ref 0.8–1.2)
Prothrombin Time: 15.1 s (ref 11.4–15.2)

## 2023-03-24 LAB — CBC WITH DIFFERENTIAL (CANCER CENTER ONLY)
Abs Immature Granulocytes: 0.46 10*3/uL — ABNORMAL HIGH (ref 0.00–0.07)
Basophils Absolute: 0 10*3/uL (ref 0.0–0.1)
Basophils Relative: 1 %
Eosinophils Absolute: 0 10*3/uL (ref 0.0–0.5)
Eosinophils Relative: 0 %
HCT: 23.5 % — ABNORMAL LOW (ref 39.0–52.0)
Hemoglobin: 7.8 g/dL — ABNORMAL LOW (ref 13.0–17.0)
Immature Granulocytes: 12 %
Lymphocytes Relative: 29 %
Lymphs Abs: 1.1 10*3/uL (ref 0.7–4.0)
MCH: 30.4 pg (ref 26.0–34.0)
MCHC: 33.2 g/dL (ref 30.0–36.0)
MCV: 91.4 fL (ref 80.0–100.0)
Monocytes Absolute: 0.3 10*3/uL (ref 0.1–1.0)
Monocytes Relative: 8 %
Neutro Abs: 1.9 10*3/uL (ref 1.7–7.7)
Neutrophils Relative %: 50 %
Platelet Count: 8 10*3/uL — CL (ref 150–400)
RBC: 2.57 MIL/uL — ABNORMAL LOW (ref 4.22–5.81)
RDW: 14.6 % (ref 11.5–15.5)
Smear Review: NORMAL
WBC Count: 3.8 10*3/uL — ABNORMAL LOW (ref 4.0–10.5)
nRBC: 2.9 % — ABNORMAL HIGH (ref 0.0–0.2)

## 2023-03-24 LAB — COMPREHENSIVE METABOLIC PANEL
ALT: 16 U/L (ref 0–44)
AST: 33 U/L (ref 15–41)
Albumin: 3.1 g/dL — ABNORMAL LOW (ref 3.5–5.0)
Alkaline Phosphatase: 105 U/L (ref 38–126)
Anion gap: 8 (ref 5–15)
BUN: 21 mg/dL (ref 8–23)
CO2: 27 mmol/L (ref 22–32)
Calcium: 8.6 mg/dL — ABNORMAL LOW (ref 8.9–10.3)
Chloride: 94 mmol/L — ABNORMAL LOW (ref 98–111)
Creatinine, Ser: 1.04 mg/dL (ref 0.61–1.24)
GFR, Estimated: >60 mL/min (ref >60)
Glucose, Bld: 101 mg/dL — ABNORMAL HIGH (ref 70–99)
Potassium: 4.2 mmol/L (ref 3.5–5.1)
Sodium: 129 mmol/L — ABNORMAL LOW (ref 135–145)
Total Bilirubin: 0.6 mg/dL (ref <1.2)
Total Protein: 7.2 g/dL (ref 6.5–8.1)

## 2023-03-24 LAB — SAMPLE TO BLOOD BANK

## 2023-03-24 LAB — ABO/RH: ABO/RH(D): A POS

## 2023-03-24 LAB — TYPE AND SCREEN
ABO/RH(D): A POS
Antibody Screen: NEGATIVE

## 2023-03-24 LAB — CBG MONITORING, ED
Glucose-Capillary: 146 mg/dL — ABNORMAL HIGH (ref 70–99)
Glucose-Capillary: 107 mg/dL — ABNORMAL HIGH (ref 70–99)

## 2023-03-24 LAB — HEMOGLOBIN A1C
Hgb A1c MFr Bld: 7.3 % — ABNORMAL HIGH (ref 4.8–5.6)
Mean Plasma Glucose: 162.81 mg/dL

## 2023-03-24 LAB — TECHNOLOGIST SMEAR REVIEW

## 2023-03-24 LAB — IMMATURE PLATELET FRACTION: Immature Platelet Fraction: 6.4 % (ref 1.2–8.6)

## 2023-03-24 MED ORDER — TAMSULOSIN HCL 0.4 MG PO CAPS
0.4000 mg | ORAL_CAPSULE | Freq: Every day | ORAL | Status: DC
  Administered 2023-03-25 – 2023-03-26 (×2): 0.4 mg via ORAL
  Filled 2023-03-24 (×2): qty 1

## 2023-03-24 MED ORDER — PRAVASTATIN SODIUM 20 MG PO TABS
20.0000 mg | ORAL_TABLET | Freq: Every day | ORAL | Status: DC
  Administered 2023-03-24 – 2023-03-26 (×2): 20 mg via ORAL
  Filled 2023-03-24 (×2): qty 1

## 2023-03-24 MED ORDER — SODIUM CHLORIDE 0.9% FLUSH: 3.0000 mL | INTRAVENOUS | Status: DC | PRN

## 2023-03-24 MED ORDER — LIDOCAINE 5 % EX PTCH
1.0000 | MEDICATED_PATCH | Freq: Two times a day (BID) | CUTANEOUS | Status: DC
  Administered 2023-03-24 – 2023-03-26 (×3): 1 via TRANSDERMAL
  Filled 2023-03-24 (×3): qty 1

## 2023-03-24 MED ORDER — MORPHINE SULFATE (PF) 4 MG/ML IV SOLN
4.0000 mg | Freq: Once | INTRAVENOUS | Status: AC
  Administered 2023-03-24: 4 mg via INTRAVENOUS
  Filled 2023-03-24: qty 1

## 2023-03-24 MED ORDER — ONDANSETRON HCL 4 MG/2ML IJ SOLN: 4.0000 mg | Freq: Four times a day (QID) | INTRAMUSCULAR | Status: DC | PRN

## 2023-03-24 MED ORDER — ACETAMINOPHEN 325 MG RE SUPP: 650.0000 mg | Freq: Four times a day (QID) | RECTAL | Status: DC | PRN

## 2023-03-24 MED ORDER — ACETAMINOPHEN 325 MG PO TABS: 650.0000 mg | ORAL_TABLET | Freq: Four times a day (QID) | ORAL | Status: DC | PRN

## 2023-03-24 MED ORDER — SODIUM CHLORIDE 0.9% FLUSH
3.0000 mL | Freq: Two times a day (BID) | INTRAVENOUS | Status: DC
  Administered 2023-03-24 – 2023-03-26 (×3): 3 mL via INTRAVENOUS

## 2023-03-24 MED ORDER — HYDROXYZINE HCL 25 MG PO TABS: 25.0000 mg | ORAL_TABLET | Freq: Three times a day (TID) | ORAL | Status: DC | PRN

## 2023-03-24 MED ORDER — MAGNESIUM HYDROXIDE 400 MG/5ML PO SUSP: 5.0000 mL | Freq: Every day | ORAL | Status: DC | PRN

## 2023-03-24 MED ORDER — POLYETHYLENE GLYCOL 3350 17 G PO PACK
17.0000 g | PACK | Freq: Every day | ORAL | Status: DC
  Administered 2023-03-24 – 2023-03-26 (×2): 17 g via ORAL
  Filled 2023-03-24 (×2): qty 1

## 2023-03-24 MED ORDER — INSULIN ASPART 100 UNIT/ML IJ SOLN
0.0000 [IU] | Freq: Three times a day (TID) | INTRAMUSCULAR | Status: DC
  Administered 2023-03-24: 2 [IU] via SUBCUTANEOUS
  Administered 2023-03-26 (×3): 3 [IU] via SUBCUTANEOUS
  Filled 2023-03-24 (×4): qty 1

## 2023-03-24 MED ORDER — ONDANSETRON HCL 4 MG PO TABS: 4.0000 mg | ORAL_TABLET | Freq: Four times a day (QID) | ORAL | Status: DC | PRN

## 2023-03-24 MED ORDER — AMLODIPINE BESYLATE 5 MG PO TABS
5.0000 mg | ORAL_TABLET | Freq: Every day | ORAL | Status: DC
  Administered 2023-03-26: 5 mg via ORAL
  Filled 2023-03-24: qty 1

## 2023-03-24 MED ORDER — TRAZODONE HCL 50 MG PO TABS
50.0000 mg | ORAL_TABLET | Freq: Every evening | ORAL | Status: DC | PRN
  Administered 2023-03-24 – 2023-03-25 (×2): 50 mg via ORAL
  Filled 2023-03-24 (×2): qty 1

## 2023-03-24 MED ORDER — SODIUM CHLORIDE 0.9 % IV SOLN: 250.0000 mL | INTRAVENOUS | Status: AC | PRN

## 2023-03-24 MED ORDER — CYCLOBENZAPRINE HCL 10 MG PO TABS: 5.0000 mg | ORAL_TABLET | Freq: Three times a day (TID) | ORAL | Status: DC | PRN

## 2023-03-24 MED ORDER — OXYCODONE-ACETAMINOPHEN 5-325 MG PO TABS
1.0000 | ORAL_TABLET | ORAL | Status: DC | PRN
  Administered 2023-03-24: 1 via ORAL
  Administered 2023-03-25 (×2): 2 via ORAL
  Filled 2023-03-24: qty 2
  Filled 2023-03-24: qty 1
  Filled 2023-03-24: qty 2

## 2023-03-24 MED ORDER — CLOTRIMAZOLE-BETAMETHASONE 1-0.05 % EX CREA
TOPICAL_CREAM | Freq: Two times a day (BID) | CUTANEOUS | Status: DC
  Filled 2023-03-24 (×2): qty 15

## 2023-03-24 NOTE — Consult Note (Signed)
Chief Complaint: Abnormal lab values and renal lesions. Request is for bone marrow biopsy and renal lesion biopsy for further evaluation of osseous lymphoma  Referring Physician(s): Dr. Rickard Patience  Supervising Physician: Ruel Favors  Patient Status: [facility] - In-pt  History of Present Illness: Kyle Hood is a 67 y.o. adult outpatient. History of HTN, HLD, Hep B, DM, fatty liver with abnormal liver functions. Recently found to have renal lesions concerning for metastatic disease and diffuse hypermetabolic bone disease.   PET scan from 11.8.24 reads:  Difficult to identify any definite renal lesions due to the markedly hypermetabolic collecting systems but they do appear to be somewhat heterogeneous. Numerous small mesenteric lymph nodes as seen on the prior CT scan. These demonstrate hypermetabolism. Index node on image 105/6 measures 14 mm and has an SUV max of 6.05. 9 mm node on image 29/6 has an SUV max of 6.32.  Diffuse hypermetabolic bone disease without obvious CT correlate except involving the right first anterior rib which appears partially destroyed with an adjacent soft tissue mass. Findings consistent with diffuse osseous lymphoma. Team is requesting a bone marrow biopsy for further evaluation of osseous lymphoma. Procedure requested reviewed and approved by IR Attending Dr. Ruthy Dick. Recommends contrast IV for right renal biopsy. Procedure was scheduled for 11.26.24 as outpatient. Found to have thrombocytopenia. (PLT 9) at oncology visit. Patient was sent to the ED for further evaluation. Patient being admitted.  Partner [name] at bedside Patient alert and laying in bed,calm. Endorses right flank pain that he states is "kidney pain" Denies any fevers, headache, chest pain, SOB, cough, nausea, vomiting or bleeding.   PLT currently 7. Team is transfusing. Albumin 3.1. All medications are within acceptable parameters. No pertinent allergies. Patient has been NPO since  midnight.  Return precautions and treatment recommendations and follow-up discussed with the patient and his partner. Both who are agreeable with the plan.    Past Medical History:  Diagnosis Date   Abnormal liver function test    Anxiety    Anxiety    Arthritis    Chronic lower back pain    Degenerative joint disease    Depression    Diabetes mellitus without complication (HCC)    Fatty liver    Heart murmur    Hepatitis B    Hyperlipidemia    Hypertension    Lumbar polyradiculopathy    Panic attacks    Polyneuropathy    Vertigo 05/03/2017   ER    Past Surgical History:  Procedure Laterality Date   COLONOSCOPY     COLONOSCOPY WITH PROPOFOL N/A 10/01/2017   Procedure: COLONOSCOPY WITH PROPOFOL;  Surgeon: Toledo, Boykin Nearing, MD;  Location: [facility] ENDOSCOPY;  Service: Endoscopy;  Laterality: N/A;   PROSTATE BIOPSY      Allergies: Lisinopril  Medications: Prior to Admission medications   Medication Sig Start Date End Date Taking? Authorizing Provider  amLODipine (NORVASC) 5 MG tablet Take 5 mg by mouth daily. 09/30/21   [provider]  clotrimazole-betamethasone (LOTRISONE) cream Apply topically 2 (two) times daily. 08/28/22   [provider]  Continuous Glucose Sensor (FREESTYLE LIBRE 3 SENSOR) MISC USE 1 SENSOR EVERY 14 DAYS 08/20/22   [provider]  cyclobenzaprine (FLEXERIL) 5 MG tablet Take 1 tablet (5 mg total) by mouth 3 (three) times daily as needed. 06/16/22   Chesley Noon, MD  glipiZIDE (GLUCOTROL XL) 5 MG 24 hr tablet Take 5 mg by mouth daily with breakfast. 02/21/22 03/24/23  [provider]  hydrochlorothiazide (HYDRODIURIL) 25 MG tablet Take 25 mg by mouth daily.    [provider]  hydrOXYzine (ATARAX) 25 MG tablet Take 1 tablet (25 mg total) by mouth every 8 (eight) hours as needed for anxiety. 10/02/22   Jomarie Longs, MD  lidocaine (LIDODERM) 5 % Place 1 patch onto the skin every 12 (twelve) hours. Remove &  Discard patch within 12 hours or as directed by MD 06/16/22 06/16/23  Chesley Noon, MD  magnesium hydroxide (MILK OF MAGNESIA) 400 MG/5ML suspension Take 5 mLs by mouth daily as needed for mild constipation.    [provider]  morphine (MS CONTIN) 15 MG 12 hr tablet Take 1 tablet (15 mg total) by mouth every 12 (twelve) hours. Patient not taking: Reported on 03/24/2023 03/21/23   Borders, Daryl Eastern, NP  naproxen (NAPROSYN) 500 MG tablet TK 1 T PO BID WC 02/24/18   [provider]  oxyCODONE-acetaminophen (PERCOCET) 5-325 MG tablet Take 1-2 tablets by mouth every 4 (four) hours as needed for severe pain (pain score 7-10). 03/21/23   Borders, Daryl Eastern, NP  polyethylene glycol (MIRALAX / GLYCOLAX) 17 g packet Take 17 g by mouth daily.    [provider]  pravastatin (PRAVACHOL) 20 MG tablet Take 20 mg by mouth daily. 02/12/21   [provider]  spironolactone (ALDACTONE) 25 MG tablet  05/07/21   [provider]  tamsulosin (FLOMAX) 0.4 MG CAPS capsule TAKE 1 CAPSULE(0.4 MG) BY MOUTH DAILY 05/03/22   McGowan, Carollee Herter A, PA-C  TRADJENTA 5 MG TABS tablet Take 5 mg by mouth daily.    [provider]  traZODone (DESYREL) 50 MG tablet Take 1 tablet (50 mg total) by mouth at bedtime as needed for sleep. Stop Quetiapine 01/08/23   Jomarie Longs, MD  valsartan (DIOVAN) 80 MG tablet Take 80 mg by mouth 2 (two) times daily. 12/16/22   [provider]     Family History  Problem Relation Age of Onset   Cancer Mother        Pancreatic   Diabetes Mother    Hypertension Mother    Varicose Veins Mother    Alcohol abuse Mother    Pancreatic cancer Mother    Cancer Father        Jaw/ oral   Heart disease Father        CABG   Aortic aneurysm Father    Congestive Heart Failure Father    Hypertension Father    Varicose Veins Father    Alcohol abuse Father    Bipolar disorder Sister    Bipolar disorder Brother     Social History    Socioeconomic History   Marital status: Single    Spouse name: denise   Number of children: 0   Years of education: Not on file   Highest education level: 11th grade  Occupational History    Comment: disabled  Tobacco Use   Smoking status: Never   Smokeless tobacco: Never  Vaping Use   Vaping status: Never Used  Substance and Sexual Activity   Alcohol use: No   Drug use: Not Currently    Types: Marijuana   Sexual activity: Not Currently  Other Topics Concern   Not on file  Social History Narrative   Not on file   Social Determinants of Health   Financial Resource Strain: Patient Declined (11/15/2022)   Received from [facility]   Overall Financial Resource Strain (CARDIA)    Difficulty of Paying Living  Expenses: Patient declined  Food Insecurity: No Food Insecurity (02/26/2023)   Hunger Vital Sign    Worried About Running Out of Food in the Last Year: Never true    Ran Out of Food in the Last Year: Never true  Transportation Needs: No Transportation Needs (02/26/2023)   PRAPARE - Administrator, Civil Service (Medical): No    Lack of Transportation (Non-Medical): No  Physical Activity: Inactive (03/19/2017)   Exercise Vital Sign    Days of Exercise per Week: 0 days    Minutes of Exercise per Session: 0 min  Stress: Not on file  Social Connections: Moderately Isolated (03/19/2017)   Social Connection and Isolation Panel [NHANES]    Frequency of Communication with Friends and Family: Twice a week    Frequency of Social Gatherings with Friends and Family: Never    Attends Religious Services: Never    Database administrator or Organizations: No    Attends Engineer, structural: Never    Marital Status: Living with partner    Review of Systems: A 12 point ROS discussed and pertinent positives are indicated in the HPI above.  All other systems are negative.  Review of Systems  Constitutional:  Negative for fatigue and fever.   HENT:  Negative for congestion.   Respiratory:  Negative for cough and shortness of breath.   Gastrointestinal:  Negative for abdominal pain, diarrhea, nausea and vomiting.  Genitourinary:  Positive for flank pain (right sided).    Vital Signs: BP (!) 142/58   Pulse 83   Temp 98 F (36.7 C) (Oral)   Resp 19   SpO2 100%   Advance Care Plan: The advanced care plan/surrogate decision maker was discussed at the time of visit and documented in the medical record. Partner [name]    Physical Exam Vitals and nursing note reviewed.  Constitutional:      Appearance: He is well-developed. He is obese.  HENT:     Head: Normocephalic and atraumatic.  Eyes:     Conjunctiva/sclera: Conjunctivae normal.  Cardiovascular:     Rate and Rhythm: Normal rate and regular rhythm.  Pulmonary:     Effort: Pulmonary effort is normal.  Musculoskeletal:        General: Normal range of motion.     Cervical back: Normal range of motion.  Skin:    General: Skin is warm.  Neurological:     General: No focal deficit present.     Mental Status: He is alert and oriented to person, place, and time. Mental status is at baseline.  Psychiatric:        Mood and Affect: Mood normal.        Behavior: Behavior normal.        Thought Content: Thought content normal.        Judgment: Judgment normal.     Imaging: NM PET Image Initial (PI) Skull Base To Thigh  Result Date: 03/10/2023 CLINICAL DATA:  Initial treatment strategy for renal lesions and abnormal blood smear. EXAM: NUCLEAR MEDICINE PET SKULL BASE TO THIGH TECHNIQUE: 12.69 mCi F-18 FDG was injected intravenously. Full-ring PET imaging was performed from the skull base to thigh after the radiotracer. CT data was obtained and used for attenuation correction and anatomic localization. Fasting blood glucose: 173 mg/dl COMPARISON:  Prior recent CT and MRI examinations. FINDINGS: Mediastinal blood pool activity: SUV max 2.06 Liver activity: SUV max 4.82 NECK:  Single small hypermetabolic focus associated with the left submandibular gland.  Possible muscle lesion or adjacent lymph node. No other significant findings in the neck. Incidental CT findings: None. CHEST: Lytic destructive hypermetabolic bone lesion involving the anterior aspect of the first rib with an associated soft tissue component. SUV max is 9.0. No enlarged or hypermetabolic mediastinal or hilar lymph nodes. No hypermetabolic axillary or supraclavicular nodes. Small hypermetabolic subpleural nodular density in the superior segment of the right lower lobe. SUV max is 4.22. Possible pleural lymph node. A few other small scattered pulmonary nodules are noted on the CT scan. Incidental CT findings: Incidental aberrant right subclavian artery. Scattered coronary artery calcifications. Calcifications noted around the aortic valve. ABDOMEN/PELVIS: No hypermetabolic hepatic or splenic lesions. The spleen is borderline enlarged and demonstrates SUV max of 4.91, slightly greater than that of the liver. Difficult to identify any definite renal lesions due to the markedly hypermetabolic collecting systems but they do appear to be somewhat heterogeneous. Numerous small mesenteric lymph nodes as seen on the prior CT scan. These demonstrate hypermetabolism. Index node on image 105/6 measures 14 mm and has an SUV max of 6.05. 9 mm node on image 29/6 has an SUV max of 6.32 No retroperitoneal or pelvic lymphadenopathy. No inguinal lymphadenopathy. There Incidental CT findings: Scattered aortic and iliac artery calcifications. Small bilateral renal calculi. Enlarged prostate gland with median lobe hypertrophy impressing on the base of the bladder. SKELETON: Diffuse hypermetabolic bone disease without obvious CT correlate except involving the right first anterior rib which appears partially destroyed with an adjacent soft tissue mass. Left sacral disease has an SUV max of 6.41. Left-sided L5 lesion has an SUV max of 7.6. T4  lesion has an SUV max of 7.03. Right femoral head lesion has an SUV max of 6.9. Incidental CT findings: None. IMPRESSION: 1. Diffuse hypermetabolic bone disease without obvious CT correlate except involving the right first anterior rib which appears partially destroyed with an adjacent soft tissue mass. Findings consistent with diffuse osseous lymphoma. 2. Numerous small hypermetabolic mesenteric lymph nodes. 3. 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 4. Borderline enlarged spleen with mild hypermetabolism. 5. Small hypermetabolic subpleural nodular density in the superior segment of the right lower lobe. Possible pleural lymph node. 6. Small hypermetabolic focus associated with the left submandibular gland. Possible muscle lesion or adjacent lymph node. 7. Aortic atherosclerosis. Aortic Atherosclerosis (ICD10-I70.0). Electronically Signed   By: Rudie Meyer M.D.   On: 03/10/2023 12:43   CT ABDOMEN PELVIS W CONTRAST  Result Date: 03/07/2023 CLINICAL DATA:  Acute nonlocalized abdominal pain, back pain EXAM: CT ABDOMEN AND PELVIS WITH CONTRAST TECHNIQUE: Multidetector CT imaging of the abdomen and pelvis was performed using the standard protocol following bolus administration of intravenous contrast. RADIATION DOSE REDUCTION: This exam was performed according to the departmental dose-optimization program which includes automated exposure control, adjustment of the mA and/or kV according to patient size and/or use of iterative reconstruction technique. CONTRAST:  OMNIPAQUE IOHEXOL 350 MG/ML SOLN COMPARISON:  MRI 02/28/2023, CT 02/23/2023 FINDINGS: Lower chest: No acute abnormality. Hepatobiliary: No focal liver abnormality is seen. No gallstones, gallbladder wall thickening, or biliary dilatation. Pancreas: Unremarkable Spleen: Normal in size without focal  abnormality. Adrenals/Urinary Tract: The adrenal glands are unremarkable. The kidneys are normal in size and position. There is heterogeneous cortical enhancement bilaterally, identical to prior examinations of 02/23/2023 and 02/17/2023 with areas of corresponding restricted diffusion and  hypoenhancement noted on a prior MRI examination suspicious for lymphomatous or leukemic infiltration. No hydronephrosis. No perinephric inflammatory stranding or fluid collections are seen. No intrarenal or ureteral calculi are identified. The bladder is unremarkable. Stomach/Bowel: Stomach, small bowel, and large bowel are unremarkable. Appendix normal. No free intraperitoneal gas or fluid. Vascular/Lymphatic: Shotty adenopathy is again seen within the mesenteric root measuring up to 10 mm in short axis diameter. No additional pathologic adenopathy within the abdomen and pelvis. Mild aortoiliac atherosclerotic calcification. No aortic aneurysm. Reproductive: Marked prostatic hypertrophy. Other: No abdominal wall hernia Musculoskeletal: 6 no acute bone abnormality. No lytic or blastic bone lesion. IMPRESSION: 1. No acute intra-abdominal pathology identified. No definite radiographic explanation for the patient's reported symptoms. 2. Heterogeneous cortical enhancement bilaterally, identical to prior examinations of 02/23/2023 and 02/17/2023 with areas of corresponding restricted diffusion and hypoenhancement noted on a prior MRI examination suspicious for lymphomatous or leukemic infiltration. PET CT examination or percutaneous needle biopsy may be helpful for further evaluation 3. Shotty adenopathy within the mesenteric root, unchanged. 4. Marked prostatic hypertrophy. Aortic Atherosclerosis (ICD10-I70.0). Electronically Signed   By: Helyn Numbers M.D.   On: 03/07/2023 02:17   MR Abdomen W Wo Contrast  Result Date: 02/28/2023 CLINICAL DATA:  Abnormal appearance of the kidneys on prior CT EXAM: MRI ABDOMEN WITHOUT AND WITH  CONTRAST TECHNIQUE: Multiplanar multisequence MR imaging of the abdomen was performed both before and after the administration of intravenous contrast. CONTRAST:  10mL GADAVIST GADOBUTROL 1 MMOL/ML IV SOLN COMPARISON:  CT abdomen pelvis, 02/23/2023 FINDINGS: Lower chest: No acute abnormality. Hepatobiliary: No solid liver abnormality is seen. Hepatic steatosis. No gallstones, gallbladder wall thickening, or biliary dilatation. Pancreas: Unremarkable. No pancreatic ductal dilatation or surrounding inflammatory changes. Spleen: Normal in size without significant abnormality. Adrenals/Urinary Tract: Adrenal glands are unremarkable. Simple, benign cyst of the superior pole of the left kidney, for which no specific further follow-up or characterization is required. Unusual, extensively heterogeneous appearance of the renal parenchyma best appreciated on diffusion-weighted sequences (series 8, image 109), and to a lesser extent early arterial phases of contrast enhancement (series 14, image 72). This is otherwise very difficult to appreciate. No obvious calculi, individual discrete lesion, or hydronephrosis. Stomach/Bowel: Stomach is within normal limits. No evidence of bowel wall thickening, distention, or inflammatory changes. Vascular/Lymphatic: Aortic atherosclerosis. No enlarged abdominal or pelvic lymph nodes. Other: No abdominal wall hernia or abnormality. No ascites. Musculoskeletal: Diffusely heterogeneous enhancement of the vertebral bone marrow (series 20, image 23). IMPRESSION: 1. Unusual, extensively heterogeneous appearance of the renal parenchyma best appreciated on diffusion-weighted sequences, and to a lesser extent early arterial phases of contrast enhancement. This appearance otherwise very difficult to appreciate on remaining sequences. No individual discrete lesion. This is of uncertain significance, and as previously reported and discussed, differential considerations would specifically include  infiltrative processes such as renal lymphomatous involvement or metastatic disease. Multifocal pyelonephritis could also have this appearance if there are clinically referable signs and symptoms and urinalysis evidence. 2. Diffusely heterogeneous enhancement of the vertebral bone marrow, which is worrisome for osseous metastatic disease, particularly in light of findings described above. 3. Hepatic steatosis. These results will be called to the ordering clinician or representative by the Radiologist Assistant, and communication documented in the PACS or [employer]. Aortic Atherosclerosis (ICD10-I70.0). Electronically Signed   By: Jearld Lesch M.D.   On: 02/28/2023 13:43   CT ABDOMEN PELVIS W CONTRAST  Result Date: 02/23/2023 CLINICAL DATA:  67 year old male with history of right lower quadrant abdominal  pain. EXAM: CT ABDOMEN AND PELVIS WITH CONTRAST TECHNIQUE: Multidetector CT imaging of the abdomen and pelvis was performed using the standard protocol following bolus administration of intravenous contrast. RADIATION DOSE REDUCTION: This exam was performed according to the departmental dose-optimization program which includes automated exposure control, adjustment of the mA and/or kV according to patient size and/or use of iterative reconstruction technique. CONTRAST:  OMNIPAQUE IOHEXOL 300 MG/ML  SOLN COMPARISON:  CT of the abdomen and pelvis 02/17/2023. FINDINGS: Lower chest: Unremarkable. Hepatobiliary: No suspicious cystic or solid hepatic lesions. No intra or extrahepatic biliary ductal dilatation. Gallbladder is unremarkable in appearance. Pancreas: No pancreatic mass. No pancreatic ductal dilatation. No pancreatic or peripancreatic fluid collections or inflammatory changes. Spleen: Unremarkable. Adrenals/Urinary Tract: The appearance of the kidneys is once again unusual with multiple poorly defined hypovascular areas, grossly similar to the recent prior examination. These are not clearly  simple cysts, and these do not appear particularly wedge-shaped (review of the patient's chart demonstrates no leukocytes in the urine on urinalysis 02/17/2023 at the time of the recently obtained CT examination), suggesting that these do not in fact represent perfusion abnormalities associated with pyelonephritis, and are rather concerning for infiltrative lesions, including potential metastatic lesions. The largest of these is noted in the interpolar region of the right kidney measuring up to 1.9 cm in diameter (axial image 39 of series 2). No hydroureteronephrosis. Urinary bladder is normal in appearance. Bilateral adrenal glands are normal in appearance. Stomach/Bowel: The appearance of the stomach is normal. No pathologic dilatation of small bowel or colon. Normal appendix. Vascular/Lymphatic: Atherosclerosis in the abdominal aorta and pelvic vasculature. No lymphadenopathy noted in the abdomen or pelvis. Reproductive: Prostate gland and seminal vesicles are unremarkable in appearance. Other: No significant volume of ascites.  No pneumoperitoneum. Musculoskeletal: There are no aggressive appearing lytic or blastic lesions noted in the visualized portions of the skeleton. IMPRESSION: 1. No acute findings are noted in the abdomen or pelvis to account for the patient's symptoms. 2. However, there is a persistently abnormal appearance of the kidneys bilaterally, with multiple persistent hypovascular lesions, the appearance of which raises concern for potential metastatic disease to the kidneys or renal lymphoma. Further clinical evaluation is recommended. 3. Aortic atherosclerosis. Electronically Signed   By: Trudie Reed M.D.   On: 02/23/2023 09:31    Labs:  CBC: Recent Labs    02/26/23 1600 03/06/23 1840 03/24/23 1004 03/24/23 1154  WBC 6.1 7.9 3.8* 4.4  HGB 12.6* 12.4* 7.8* 8.2*  HCT 36.8* 36.4* 23.5* 23.9*  PLT 134* 44* 8* 7*    COAGS: No results for input(s): "INR", "APTT" in the last  8760 hours.  BMP: Recent Labs    02/26/23 1600 03/06/23 1840 03/24/23 1004 03/24/23 1154  NA 133* 130* 130* 129*  K 3.8 3.9 4.2 4.2  CL 101 95* 95* 94*  CO2 22 22 24 27   GLUCOSE 92 186* 191* 101*  BUN 15 15 22 21   CALCIUM 9.1 8.9 8.5* 8.6*  CREATININE 0.86 0.98 1.15 1.04  GFRNONAA >60 >60 >60 >60    LIVER FUNCTION TESTS: Recent Labs    02/26/23 1600 03/06/23 1840 03/24/23 1004 03/24/23 1154  BILITOT 0.4 1.0 0.6 0.6  AST 28 45* 33 33  ALT 20 20 16 16   ALKPHOS 74 99 104 105  PROT 7.4 8.0 6.9 7.2  ALBUMIN 3.8 4.0 3.2* 3.1*    Assessment and Plan:  History of HTN, HLD, Hep B, DM, fatty liver with abnormal liver functions. Recently  found to have renal lesions concerning for metastatic disease and diffuse hypermetabolic bone disease. Team is requesting a bone marrow biopsy and renal lesion biopsy for further evaluation of osseous lymphoma.    Plan: Patient tentatively scheduled for 11.26.24 for bone marrow biopsy. If PLT <50 then renal lesion biopsy will also be performed  Team instructed to: Keep Patient to be NPO after midnight  IR will call patient when ready.  Risks and benefits of bone marrow biopsy and renal lesion biopsy was discussed with the patient and partner including, but not limited to bleeding, infection, damage to adjacent structures or low yield requiring additional tests.  All of the questions were answered and there is agreement to proceed.  Consent signed and in IR control room.   Thank you for this interesting consult.  I greatly enjoyed meeting Kyle Hood and look forward to participating in their care.  A copy of this report was sent to the requesting provider on this date.  Electronically Signed: Alene Mires, NP 03/24/2023, 3:25 PM   I spent a total of 20 Minutes    in face to face in clinical consultation, greater than 50% of which was counseling/coordinating care for bone marrow biopsy and renal lesion biopsy

## 2023-03-24 NOTE — ED Notes (Signed)
1 st bag of platelets infused.  No reaction noted.  Pt alert   friend with pt.  Nsr on monitor.  Iv fluids infusing.

## 2023-03-24 NOTE — ED Triage Notes (Signed)
Pt to ED via POV from home. Pt reports was sent over after labwork showed HGB 7.8 and plt count is 9. Pt reports they have found a lesion on kidney andw as concerned for metastatic disease and was scheduled to have biopsy tomorrow. Pt denies bleeding. Pt reports intermittent dizziness. Pt also reports lower back pain.

## 2023-03-24 NOTE — Assessment & Plan Note (Signed)
Concern for possible metastatic disease MRI abdomen confirmed unusual, extensively heterogeneous appearance of the renal parenchyma, concerning for  infiltrative processes such as renal lymphomatous involvement or metastatic disease. Patient is scheduled for renal biopsy in a.m. if platelet count is over 50

## 2023-03-24 NOTE — Assessment & Plan Note (Signed)
??    Malignancy No evidence of bleeding Will transfuse 2 bags of platelets and repeat CBC Will consult oncology

## 2023-03-24 NOTE — Assessment & Plan Note (Signed)
Continue trazodone 

## 2023-03-24 NOTE — Telephone Encounter (Addendum)
Received critical lab from Gershon Mussel at [facility]:   Platelet 8.  03/24/23 at 11:46am

## 2023-03-24 NOTE — ED Notes (Signed)
Fsbs 146

## 2023-03-24 NOTE — Assessment & Plan Note (Signed)
Hold oral hypoglycemic agents Maintain consistent carbohydrate diet Glycemic control with sliding scale insulin

## 2023-03-24 NOTE — H&P (Signed)
History and Physical    Patient: Kyle Hood ZDG:[id number] DOB: [birth year] DOA: 03/24/2023 DOS: the patient was seen and examined on 03/24/2023 PCP: Dorothey Baseman, MD  Patient coming from: Home  Chief Complaint:  Chief Complaint  Patient presents with   Abnormal Lab   HPI: Ruffin Berzins is a 67 y.o. 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 Patient notes that he has had worsening low back pain associated with night sweats over the last several months and is being seen by oncology.  Patient is currently being worked up by oncology and is scheduled for a bone marrow and renal biopsy in a.m. He had lab work done 1 day prior to the biopsy and was found to have a platelet count of 9.  He denies having any rectal bleeding, no hematuria but states that he had hemoptysis 1 week prior to presenting to the ER.  He complains of a poor appetite, a 10 to 15 pound weight loss over the last several weeks and low back pain with occasional radiation to his hips He denies feeling dizzy or lightheaded, he denies having any headache, no abdominal pain, no changes in his bowel habits, no blurred vision, no cough, no fever, no chills, no focal deficit. Abnormal labs include a hemoglobin of 8.2, platelet count of 7, sodium of 129 2 bags of platelets were ordered in the ER and patient is currently being transfused      Review of Systems: As mentioned in the history of present illness. All other systems reviewed and are negative. Past Medical History:  Diagnosis Date   Abnormal liver function test    Anxiety    Anxiety    Arthritis    Chronic lower back pain    Degenerative joint disease    Depression    Diabetes mellitus without complication  (HCC)    Fatty liver    Heart murmur    Hepatitis B    Hyperlipidemia    Hypertension    Lumbar polyradiculopathy    Panic attacks    Polyneuropathy    Vertigo 05/03/2017   ER   Past Surgical History:  Procedure Laterality Date   COLONOSCOPY     COLONOSCOPY WITH PROPOFOL N/A 10/01/2017   Procedure: COLONOSCOPY WITH PROPOFOL;  Surgeon: Toledo, Boykin Nearing, MD;  Location: [facility] ENDOSCOPY;  Service: Endoscopy;  Laterality: N/A;   PROSTATE BIOPSY     Social History:  reports that he has never smoked. He has never used smokeless tobacco. He reports that he does not currently use drugs after having used the following drugs: Marijuana. He reports that he does not drink alcohol.  Allergies  Allergen Reactions   Lisinopril Cough    Family History  Problem Relation Age of Onset   Cancer Mother        Pancreatic   Diabetes Mother    Hypertension Mother    Varicose Veins Mother    Alcohol abuse Mother    Pancreatic cancer Mother    Cancer Father        Jaw/ oral   Heart disease Father        CABG   Aortic aneurysm Father    Congestive Heart Failure Father    Hypertension Father    Varicose Veins Father    Alcohol abuse Father  Bipolar disorder Sister    Bipolar disorder Brother     Prior to Admission medications   Medication Sig Start Date End Date Taking? Authorizing Provider  amLODipine (NORVASC) 5 MG tablet Take 5 mg by mouth daily. 09/30/21   [provider]  clotrimazole-betamethasone (LOTRISONE) cream Apply topically 2 (two) times daily. 08/28/22   [provider]  Continuous Glucose Sensor (FREESTYLE LIBRE 3 SENSOR) MISC USE 1 SENSOR EVERY 14 DAYS 08/20/22   [provider]  cyclobenzaprine (FLEXERIL) 5 MG tablet Take 1 tablet (5 mg total) by mouth 3 (three) times daily as needed. 06/16/22   Chesley Noon, MD  glipiZIDE (GLUCOTROL XL) 5 MG 24 hr tablet Take 5 mg by mouth daily with breakfast. 02/21/22 03/24/23  [provider]   hydrochlorothiazide (HYDRODIURIL) 25 MG tablet Take 25 mg by mouth daily.    [provider]  hydrOXYzine (ATARAX) 25 MG tablet Take 1 tablet (25 mg total) by mouth every 8 (eight) hours as needed for anxiety. 10/02/22   Jomarie Longs, MD  lidocaine (LIDODERM) 5 % Place 1 patch onto the skin every 12 (twelve) hours. Remove & Discard patch within 12 hours or as directed by MD 06/16/22 06/16/23  Chesley Noon, MD  magnesium hydroxide (MILK OF MAGNESIA) 400 MG/5ML suspension Take 5 mLs by mouth daily as needed for mild constipation.    [provider]  morphine (MS CONTIN) 15 MG 12 hr tablet Take 1 tablet (15 mg total) by mouth every 12 (twelve) hours. Patient not taking: Reported on 03/24/2023 03/21/23   Borders, Daryl Eastern, NP  naproxen (NAPROSYN) 500 MG tablet TK 1 T PO BID WC 02/24/18   [provider]  oxyCODONE-acetaminophen (PERCOCET) 5-325 MG tablet Take 1-2 tablets by mouth every 4 (four) hours as needed for severe pain (pain score 7-10). 03/21/23   Borders, Daryl Eastern, NP  polyethylene glycol (MIRALAX / GLYCOLAX) 17 g packet Take 17 g by mouth daily.    [provider]  pravastatin (PRAVACHOL) 20 MG tablet Take 20 mg by mouth daily. 02/12/21   [provider]  spironolactone (ALDACTONE) 25 MG tablet  05/07/21   [provider]  tamsulosin (FLOMAX) 0.4 MG CAPS capsule TAKE 1 CAPSULE(0.4 MG) BY MOUTH DAILY 05/03/22   McGowan, Carollee Herter A, PA-C  TRADJENTA 5 MG TABS tablet Take 5 mg by mouth daily.    [provider]  traZODone (DESYREL) 50 MG tablet Take 1 tablet (50 mg total) by mouth at bedtime as needed for sleep. Stop Quetiapine 01/08/23   Jomarie Longs, MD  valsartan (DIOVAN) 80 MG tablet Take 80 mg by mouth 2 (two) times daily. 12/16/22   [provider]    Physical Exam: Vitals:   03/24/23 1429 03/24/23 1445 03/24/23 1518 03/24/23 1520  BP: (!) 124/56 132/68 (!) 142/58   Pulse: 77 89 80 83  Resp: 18 19 20 19   Temp: 98.3  F (36.8 C) 98.3 F (36.8 C) 98 F (36.7 C)   TempSrc: Oral Oral Oral   SpO2: 97% 96% 97% 100%   Physical Exam Vitals and nursing note reviewed.  Constitutional:      Appearance: Normal appearance. He is obese.  HENT:     Head: Normocephalic.     Nose: Nose normal.     Mouth/Throat:     Mouth: Mucous membranes are moist.  Eyes:     Comments: Pale conjunctiva  Cardiovascular:     Rate and Rhythm: Normal rate and regular rhythm.  Pulmonary:  Effort: Pulmonary effort is normal.     Breath sounds: Normal breath sounds.  Abdominal:     General: Abdomen is flat. Bowel sounds are normal.     Palpations: Abdomen is soft.  Musculoskeletal:        General: Normal range of motion.     Cervical back: Normal range of motion and neck supple.  Skin:    Comments: Scattered petechia rash  Neurological:     Mental Status: He is alert and oriented to person, place, and time.  Psychiatric:        Mood and Affect: Mood normal.        Behavior: Behavior normal.     Data Reviewed: Relevant notes from primary care and specialist visits, past discharge summaries as available in EHR, including Care Everywhere. Prior diagnostic testing as pertinent to current admission diagnoses Updated medications and problem lists for reconciliation ED course, including vitals, labs, imaging, treatment and response to treatment Triage notes, nursing and pharmacy notes and ED provider's notes Notable results as noted in HPI Labs reviewed.  White count 4.4, hemoglobin 8.2, hematocrit 23.9, platelet count 7, sodium 129, potassium 4.2, chloride 94, bicarb 27, glucose 101, BUN 21, creatinine 1.04, calcium 8.6, total protein 7.2, albumin 3.1, AST 33, ALT 16, alk phos 105, total bilirubin 0.6  There are no new results to review at this time.  Assessment and Plan: * Thrombocytopenia (HCC) ??  Malignancy No evidence of bleeding Will transfuse 2 bags of platelets and repeat CBC Will consult  oncology  Metastasis [facility]) Concern for possible metastatic disease MRI abdomen confirmed unusual, extensively heterogeneous appearance of the renal parenchyma, concerning for  infiltrative processes such as renal lymphomatous involvement or metastatic disease. Patient is scheduled for renal biopsy in a.m. if platelet count is over 50  Obesity (BMI 30.0-34.9) BMI 34 Complicates overall prognosis and care Lifestyle modification and exercise has been discussed with patient in detail  MDD (major depressive disorder), recurrent, in partial remission (HCC) Continue trazodone  Controlled type 2 diabetes mellitus without complication, without long-term current use of insulin (HCC) Hold oral hypoglycemic agents Maintain consistent carbohydrate diet Glycemic control with sliding scale insulin  Hypertension Continue amlodipine      Advance Care Planning:   Code Status: Full Code   Consults: Oncology, interventional radiology  Family Communication: Plan of care discussed with patient and his partner [name] at the bedside.  All questions and concerns have been addressed.  They verbalized understanding and agreement.  He lists [name] as his healthcare power of attorney.  Severity of Illness: The appropriate patient status for this patient is OBSERVATION. Observation status is judged to be reasonable and necessary in order to provide the required intensity of service to ensure the patient's safety. The patient's presenting symptoms, physical exam findings, and initial radiographic and laboratory data in the context of their medical condition is felt to place them at decreased risk for further clinical deterioration. Furthermore, it is anticipated that the patient will be medically stable for discharge from the hospital within 2 midnights of admission.   Author: Lucile Shutters, MD 03/24/2023 3:26 PM  For on call review [email or web address].

## 2023-03-24 NOTE — Assessment & Plan Note (Signed)
BMI 34 Complicates overall prognosis and care Lifestyle modification and exercise has been discussed with patient in detail

## 2023-03-24 NOTE — ED Notes (Signed)
Pt moved to room 38 cpod.  Report off to cpod nurse.

## 2023-03-24 NOTE — ED Notes (Signed)
Pt has lower back spams, pain meds given

## 2023-03-24 NOTE — Assessment & Plan Note (Signed)
-   Continue amlodipine ?

## 2023-03-24 NOTE — Progress Notes (Signed)
Symptom Management [facility] at [facility] [phone] [phone]  Patient Care Team: Dorothey Baseman, MD as PCP - General (Family Medicine) Rickard Patience, MD as Consulting Physician (Oncology)   NAME OF PATIENT: Kyle Hood  [id number]  06-23-1955   DATE OF VISIT: 03/24/23  REASON FOR CONSULT: Kyle Hood is a 66 y.o. adult recently found to have infiltrative kidney lesions concerning for metastatic disease also affecting the lumbar spine. Patient currently undergoing workup.   INTERVAL HISTORY: Patient seen in the emergency department 03/07/2023 with back pain. Imaging appeared consistent with known, probable metastatic disease. Patient received prescription for oxycodone.   I had a virtual visit with patient on 03/21/2023 to address neoplasm related pain.  Patient was started on MS Contin every 12 hours.  Patient also complained of intermittent scant hemoptysis last week.  He presents [facility] today for labs and follow-up.  Today, patient says that he continues to feel weak with poor oral intake.  Denies fever or chills.  Denies bleeding.  Continues to have night sweats and back pain.  Denies any neurologic complaints.Denies any easy bleeding or bruising. Denies chest pain. Denies any nausea, vomiting, constipation, or diarrhea. Denies urinary complaints. Patient offers no further specific complaints today.  PAST MEDICAL HISTORY: Past Medical History:  Diagnosis Date   Abnormal liver function test    Anxiety    Anxiety    Arthritis    Chronic lower back pain    Degenerative joint disease    Depression    Diabetes mellitus without complication (HCC)    Fatty liver    Heart murmur    Hepatitis B    Hyperlipidemia    Hypertension    Lumbar polyradiculopathy    Panic attacks    Polyneuropathy    Vertigo 05/03/2017   ER    PAST SURGICAL HISTORY:  Past Surgical History:  Procedure Laterality Date   COLONOSCOPY      COLONOSCOPY WITH PROPOFOL N/A 10/01/2017   Procedure: COLONOSCOPY WITH PROPOFOL;  Surgeon: Toledo, Boykin Nearing, MD;  Location: [facility] ENDOSCOPY;  Service: Endoscopy;  Laterality: N/A;   PROSTATE BIOPSY      HEMATOLOGY/ONCOLOGY HISTORY:  Oncology History   No history exists.    ALLERGIES:  is allergic to lisinopril.  MEDICATIONS:  Current Outpatient Medications  Medication Sig Dispense Refill   amLODipine (NORVASC) 5 MG tablet Take 5 mg by mouth daily.     clotrimazole-betamethasone (LOTRISONE) cream Apply topically 2 (two) times daily.     Continuous Glucose Sensor (FREESTYLE LIBRE 3 SENSOR) MISC USE 1 SENSOR EVERY 14 DAYS     cyclobenzaprine (FLEXERIL) 5 MG tablet Take 1 tablet (5 mg total) by mouth 3 (three) times daily as needed. 12 tablet 0   glipiZIDE (GLUCOTROL XL) 5 MG 24 hr tablet Take 5 mg by mouth daily with breakfast.     hydrochlorothiazide (HYDRODIURIL) 25 MG tablet Take 25 mg by mouth daily.     hydrOXYzine (ATARAX) 25 MG tablet Take 1 tablet (25 mg total) by mouth every 8 (eight) hours as needed for anxiety. 90 tablet 1   lidocaine (LIDODERM) 5 % Place 1 patch onto the skin every 12 (twelve) hours. Remove & Discard patch within 12 hours or as directed by MD 10 patch 0   morphine (MS CONTIN) 15 MG 12 hr tablet Take 1 tablet (15 mg total) by mouth every 12 (twelve) hours. 60 tablet 0   naproxen (NAPROSYN) 500 MG tablet TK 1 T PO  BID WC  3   oxyCODONE-acetaminophen (PERCOCET) 5-325 MG tablet Take 1-2 tablets by mouth every 4 (four) hours as needed for severe pain (pain score 7-10).     pravastatin (PRAVACHOL) 20 MG tablet Take 20 mg by mouth daily.     spironolactone (ALDACTONE) 25 MG tablet      tamsulosin (FLOMAX) 0.4 MG CAPS capsule TAKE 1 CAPSULE(0.4 MG) BY MOUTH DAILY 90 capsule 3   TRADJENTA 5 MG TABS tablet Take 5 mg by mouth daily.     traZODone (DESYREL) 50 MG tablet Take 1 tablet (50 mg total) by mouth at bedtime as needed for sleep. Stop Quetiapine 90 tablet 1    valsartan (DIOVAN) 80 MG tablet Take 80 mg by mouth 2 (two) times daily.     No current facility-administered medications for this visit.    VITAL SIGNS: Temp 99.8 F (37.7 C) (Tympanic)   Resp 18  There were no vitals filed for this visit.  Estimated body mass index is 34.3 kg/m as calculated from the following:   Height as of 03/06/23: 6\' 1"  (1.854 m).   Weight as of 03/06/23: 260 lb (117.9 kg).  LABS: CBC:    Component Value Date/Time   WBC 7.9 03/06/2023 1840   HGB 12.4 (L) 03/06/2023 1840   HCT 36.4 (L) 03/06/2023 1840   PLT 44 (L) 03/06/2023 1840   MCV 90.1 03/06/2023 1840   NEUTROABS 3.5 02/26/2023 1600   NEUTROABS 5 08/16/2014 0000   LYMPHSABS 1.7 02/26/2023 1600   MONOABS 0.2 02/26/2023 1600   EOSABS 0.2 02/26/2023 1600   BASOSABS 0.0 02/26/2023 1600   Comprehensive Metabolic Panel:    Component Value Date/Time   NA 130 (L) 03/24/2023 1004   NA 137 08/16/2014 0000   K 4.2 03/24/2023 1004   CL 95 (L) 03/24/2023 1004   CO2 24 03/24/2023 1004   BUN 22 03/24/2023 1004   BUN 11 08/16/2014 0000   CREATININE 1.15 03/24/2023 1004   GLUCOSE 191 (H) 03/24/2023 1004   CALCIUM 8.5 (L) 03/24/2023 1004   AST 33 03/24/2023 1004   ALT 16 03/24/2023 1004   ALKPHOS 104 03/24/2023 1004   BILITOT 0.6 03/24/2023 1004   PROT 6.9 03/24/2023 1004   ALBUMIN 3.2 (L) 03/24/2023 1004    RADIOGRAPHIC STUDIES: NM PET Image Initial (PI) Skull Base To Thigh  Result Date: 03/10/2023 CLINICAL DATA:  Initial treatment strategy for renal lesions and abnormal blood smear. EXAM: NUCLEAR MEDICINE PET SKULL BASE TO THIGH TECHNIQUE: 12.69 mCi F-18 FDG was injected intravenously. Full-ring PET imaging was performed from the skull base to thigh after the radiotracer. CT data was obtained and used for attenuation correction and anatomic localization. Fasting blood glucose: 173 mg/dl COMPARISON:  Prior recent CT and MRI examinations. FINDINGS: Mediastinal blood pool activity: SUV max 2.06 Liver  activity: SUV max 4.82 NECK: Single small hypermetabolic focus associated with the left submandibular gland. Possible muscle lesion or adjacent lymph node. No other significant findings in the neck. Incidental CT findings: None. CHEST: Lytic destructive hypermetabolic bone lesion involving the anterior aspect of the first rib with an associated soft tissue component. SUV max is 9.0. No enlarged or hypermetabolic mediastinal or hilar lymph nodes. No hypermetabolic axillary or supraclavicular nodes. Small hypermetabolic subpleural nodular density in the superior segment of the right lower lobe. SUV max is 4.22. Possible pleural lymph node. A few other small scattered pulmonary nodules are noted on the CT scan. Incidental CT findings: Incidental aberrant right subclavian artery.  Scattered coronary artery calcifications. Calcifications noted around the aortic valve. ABDOMEN/PELVIS: No hypermetabolic hepatic or splenic lesions. The spleen is borderline enlarged and demonstrates SUV max of 4.91, slightly greater than that of the liver. Difficult to identify any definite renal lesions due to the markedly hypermetabolic collecting systems but they do appear to be somewhat heterogeneous. Numerous small mesenteric lymph nodes as seen on the prior CT scan. These demonstrate hypermetabolism. Index node on image 105/6 measures 14 mm and has an SUV max of 6.05. 9 mm node on image 29/6 has an SUV max of 6.32 No retroperitoneal or pelvic lymphadenopathy. No inguinal lymphadenopathy. There Incidental CT findings: Scattered aortic and iliac artery calcifications. Small bilateral renal calculi. Enlarged prostate gland with median lobe hypertrophy impressing on the base of the bladder. SKELETON: Diffuse hypermetabolic bone disease without obvious CT correlate except involving the right first anterior rib which appears partially destroyed with an adjacent soft tissue mass. Left sacral disease has an SUV max of 6.41. Left-sided L5 lesion  has an SUV max of 7.6. T4 lesion has an SUV max of 7.03. Right femoral head lesion has an SUV max of 6.9. Incidental CT findings: None. IMPRESSION: 1. Diffuse hypermetabolic bone disease without obvious CT correlate except involving the right first anterior rib which appears partially destroyed with an adjacent soft tissue mass. Findings consistent with diffuse osseous lymphoma. 2. Numerous small hypermetabolic mesenteric lymph nodes. 3. 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 4. Borderline enlarged spleen with mild hypermetabolism. 5. Small hypermetabolic subpleural nodular density in the superior segment of the right lower lobe. Possible pleural lymph node. 6. Small hypermetabolic focus associated with the left submandibular gland. Possible muscle lesion or adjacent lymph node. 7. Aortic atherosclerosis. Aortic Atherosclerosis (ICD10-I70.0). Electronically Signed   By: Rudie Meyer M.D.   On: 03/10/2023 12:43   CT ABDOMEN PELVIS W CONTRAST  Result Date: 03/07/2023 CLINICAL DATA:  Acute nonlocalized abdominal pain, back pain EXAM: CT ABDOMEN AND PELVIS WITH CONTRAST TECHNIQUE: Multidetector CT imaging of the abdomen and pelvis was performed using the standard protocol following bolus administration of intravenous contrast. RADIATION DOSE REDUCTION: This exam was performed according to the departmental dose-optimization program which includes automated exposure control, adjustment of the mA and/or kV according to patient size and/or use of iterative reconstruction technique. CONTRAST:  OMNIPAQUE IOHEXOL 350 MG/ML SOLN COMPARISON:  MRI 02/28/2023, CT 02/23/2023 FINDINGS: Lower chest: No acute abnormality. Hepatobiliary: No focal liver abnormality is seen. No gallstones, gallbladder wall thickening, or biliary dilatation. Pancreas: Unremarkable Spleen:  Normal in size without focal abnormality. Adrenals/Urinary Tract: The adrenal glands are unremarkable. The kidneys are normal in size and position. There is heterogeneous cortical enhancement bilaterally, identical to prior examinations of 02/23/2023 and 02/17/2023 with areas of corresponding restricted diffusion and hypoenhancement noted on a prior MRI examination suspicious for lymphomatous or leukemic infiltration. No hydronephrosis. No perinephric inflammatory stranding or fluid collections are seen. No intrarenal or ureteral calculi are identified. The bladder is unremarkable. Stomach/Bowel: Stomach, small bowel, and large bowel are unremarkable. Appendix normal. No free intraperitoneal gas or fluid. Vascular/Lymphatic: Shotty adenopathy is again seen within the mesenteric root measuring up to 10 mm in short axis diameter. No additional pathologic adenopathy within the abdomen and pelvis. Mild aortoiliac atherosclerotic calcification. No aortic aneurysm. Reproductive: Marked prostatic hypertrophy. Other: No abdominal wall hernia Musculoskeletal: 6 no acute  bone abnormality. No lytic or blastic bone lesion. IMPRESSION: 1. No acute intra-abdominal pathology identified. No definite radiographic explanation for the patient's reported symptoms. 2. Heterogeneous cortical enhancement bilaterally, identical to prior examinations of 02/23/2023 and 02/17/2023 with areas of corresponding restricted diffusion and hypoenhancement noted on a prior MRI examination suspicious for lymphomatous or leukemic infiltration. PET CT examination or percutaneous needle biopsy may be helpful for further evaluation 3. Shotty adenopathy within the mesenteric root, unchanged. 4. Marked prostatic hypertrophy. Aortic Atherosclerosis (ICD10-I70.0). Electronically Signed   By: Helyn Numbers M.D.   On: 03/07/2023 02:17   MR Abdomen W Wo Contrast  Result Date: 02/28/2023 CLINICAL DATA:  Abnormal appearance of the kidneys on prior CT EXAM:  MRI ABDOMEN WITHOUT AND WITH CONTRAST TECHNIQUE: Multiplanar multisequence MR imaging of the abdomen was performed both before and after the administration of intravenous contrast. CONTRAST:  10mL GADAVIST GADOBUTROL 1 MMOL/ML IV SOLN COMPARISON:  CT abdomen pelvis, 02/23/2023 FINDINGS: Lower chest: No acute abnormality. Hepatobiliary: No solid liver abnormality is seen. Hepatic steatosis. No gallstones, gallbladder wall thickening, or biliary dilatation. Pancreas: Unremarkable. No pancreatic ductal dilatation or surrounding inflammatory changes. Spleen: Normal in size without significant abnormality. Adrenals/Urinary Tract: Adrenal glands are unremarkable. Simple, benign cyst of the superior pole of the left kidney, for which no specific further follow-up or characterization is required. Unusual, extensively heterogeneous appearance of the renal parenchyma best appreciated on diffusion-weighted sequences (series 8, image 109), and to a lesser extent early arterial phases of contrast enhancement (series 14, image 72). This is otherwise very difficult to appreciate. No obvious calculi, individual discrete lesion, or hydronephrosis. Stomach/Bowel: Stomach is within normal limits. No evidence of bowel wall thickening, distention, or inflammatory changes. Vascular/Lymphatic: Aortic atherosclerosis. No enlarged abdominal or pelvic lymph nodes. Other: No abdominal wall hernia or abnormality. No ascites. Musculoskeletal: Diffusely heterogeneous enhancement of the vertebral bone marrow (series 20, image 23). IMPRESSION: 1. Unusual, extensively heterogeneous appearance of the renal parenchyma best appreciated on diffusion-weighted sequences, and to a lesser extent early arterial phases of contrast enhancement. This appearance otherwise very difficult to appreciate on remaining sequences. No individual discrete lesion. This is of uncertain significance, and as previously reported and discussed, differential considerations  would specifically include infiltrative processes such as renal lymphomatous involvement or metastatic disease. Multifocal pyelonephritis could also have this appearance if there are clinically referable signs and symptoms and urinalysis evidence. 2. Diffusely heterogeneous enhancement of the vertebral bone marrow, which is worrisome for osseous metastatic disease, particularly in light of findings described above. 3. Hepatic steatosis. These results will be called to the ordering clinician or representative by the Radiologist Assistant, and communication documented in the PACS or [employer]. Aortic Atherosclerosis (ICD10-I70.0). Electronically Signed   By: Jearld Lesch M.D.   On: 02/28/2023 13:43   CT ABDOMEN PELVIS W CONTRAST  Result Date: 02/23/2023 CLINICAL DATA:  67 year old male with history of right lower quadrant abdominal pain. EXAM: CT ABDOMEN AND PELVIS WITH CONTRAST TECHNIQUE: Multidetector CT imaging of the abdomen and pelvis was performed using the standard protocol following bolus administration of intravenous contrast. RADIATION DOSE REDUCTION: This exam was performed according to the departmental dose-optimization program which includes automated exposure control, adjustment of the mA and/or kV according to patient size and/or use of iterative reconstruction technique. CONTRAST:  OMNIPAQUE IOHEXOL 300 MG/ML  SOLN COMPARISON:  CT of the abdomen and pelvis 02/17/2023. FINDINGS: Lower chest: Unremarkable. Hepatobiliary: No suspicious cystic or solid hepatic lesions. No intra or extrahepatic biliary ductal dilatation.  Gallbladder is unremarkable in appearance. Pancreas: No pancreatic mass. No pancreatic ductal dilatation. No pancreatic or peripancreatic fluid collections or inflammatory changes. Spleen: Unremarkable. Adrenals/Urinary Tract: The appearance of the kidneys is once again unusual with multiple poorly defined hypovascular areas, grossly similar to the recent prior  examination. These are not clearly simple cysts, and these do not appear particularly wedge-shaped (review of the patient's chart demonstrates no leukocytes in the urine on urinalysis 02/17/2023 at the time of the recently obtained CT examination), suggesting that these do not in fact represent perfusion abnormalities associated with pyelonephritis, and are rather concerning for infiltrative lesions, including potential metastatic lesions. The largest of these is noted in the interpolar region of the right kidney measuring up to 1.9 cm in diameter (axial image 39 of series 2). No hydroureteronephrosis. Urinary bladder is normal in appearance. Bilateral adrenal glands are normal in appearance. Stomach/Bowel: The appearance of the stomach is normal. No pathologic dilatation of small bowel or colon. Normal appendix. Vascular/Lymphatic: Atherosclerosis in the abdominal aorta and pelvic vasculature. No lymphadenopathy noted in the abdomen or pelvis. Reproductive: Prostate gland and seminal vesicles are unremarkable in appearance. Other: No significant volume of ascites.  No pneumoperitoneum. Musculoskeletal: There are no aggressive appearing lytic or blastic lesions noted in the visualized portions of the skeleton. IMPRESSION: 1. No acute findings are noted in the abdomen or pelvis to account for the patient's symptoms. 2. However, there is a persistently abnormal appearance of the kidneys bilaterally, with multiple persistent hypovascular lesions, the appearance of which raises concern for potential metastatic disease to the kidneys or renal lymphoma. Further clinical evaluation is recommended. 3. Aortic atherosclerosis. Electronically Signed   By: Trudie Reed M.D.   On: 02/23/2023 09:31    PERFORMANCE STATUS (ECOG) : 2 - Symptomatic, <50% confined to bed  Review of Systems Unless otherwise noted, a complete review of systems is negative.  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  Patient pending bone marrow and renal biopsies tomorrow.  + B symptoms.  Pancytopenia -with critically low platelets of 8.  Patient will require platelet transfusion prior to proceeding with biopsies.  Discussed with Dr. Cathie Hoops who recommended hospitalization for stabilization and workup.  Neoplasm related pain -patient has been taking Percocet.  With prescribed MS Contin last week but was unable to pick it up from the pharmacy.  Patient being transferred to the emergency department for further evaluation and management.   Patient expressed understanding and was in agreement with this plan. He also understands that He can call clinic at any time with any questions, concerns, or complaints.   Thank you for allowing me to participate in the care of this very pleasant patient.   Time Total: 25 minutes  Visit consisted of counseling and education dealing with the complex and emotionally intense issues of symptom management in the setting of serious illness.Greater than 50%  of this time was spent counseling and coordinating care related to the above assessment and plan.  Signed by: Laurette Schimke, PhD, NP-C

## 2023-03-24 NOTE — Progress Notes (Signed)
03/24/23--1054am-Critical plt count called by Almeta Monas in cancer center lab. Plt count is 9. Read back process performed with lab tech. 1055-Verbal report given to Sharia Reeve, NP for critical lab. Patient will need stat plt transfusion per josh.  Of note - Hgb is 7.8

## 2023-03-24 NOTE — Progress Notes (Signed)
Patient here for [facility] visit to discuss pain control and recheck plt counts.  Pt c/o fatigue. Pt is experiencing orthostatic hypotension on today's visit as well as appears to be experiencing profuse sweating. Poor appetite noted.  Pt endorses concerns for bilateral lower extremity edema. Pts legs are cold to touch. Right leg is more swollen in comparison to left. Petechiae present on patient's legs and forearms. Reports pain in lower back that radiates down into hips. Pain is affects his ability to walk. Currently taking oxycodone 2 tablets every 6 hours for the pain. He has not been able to take the MS Contin 15 mg 12 hr as the pharmacy did not have it in stock until today and patient plans to pick up the MsContin after today's visit.

## 2023-03-24 NOTE — Telephone Encounter (Signed)
-----   Message from Chriss Czar sent at 03/24/2023  8:11 AM EST ----- Regarding: NOTICE OF DENIAL -[employer] NOTICE OF DENIAL -[employer] for oxycodone  sent to his chart.

## 2023-03-24 NOTE — ED Provider Notes (Signed)
[facility] Provider Note    Event Date/Time   First MD Initiated Contact with Patient 03/24/23 1305     (approximate)   History   Chief Complaint Abnormal Lab   HPI  Kyle Hood is a 67 y.o. adult with past medical history of hypertension, hyperlipidemia, diabetes who presents to the ED for abnormal labs.  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  He denies any symptoms other than longstanding back pain, has not noticed any recent bleeding.  He states he is scheduled for biopsy tomorrow, but was sent to the ED by oncology for platelet transfusion and admission to facilitate biopsy.     Physical Exam   Triage Vital Signs: ED Triage Vitals [03/24/23 1147]  Encounter Vitals Group     BP 100/61     Systolic BP Percentile      Diastolic BP Percentile      Pulse Rate 84     Resp 20     Temp 97.8 F (36.6 C)     Temp Source Axillary     SpO2 99 %     Weight      Height      Head Circumference      Peak Flow      Pain Score 0     Pain Loc      Pain Education      Exclude from Growth Chart     Most recent vital signs: Vitals:   03/24/23 1147 03/24/23 1345  BP: 100/61 (!) 129/59  Pulse: 84 79  Resp: 20 18  Temp: 97.8 F (36.6 C)   SpO2: 99% 99%    Constitutional: Alert and oriented. Eyes: Conjunctivae are normal. Head: Atraumatic. Nose: No congestion/rhinnorhea. Mouth/Throat: Mucous membranes are moist.  Cardiovascular: Normal rate, regular rhythm. Grossly normal heart sounds.  2+ radial pulses bilaterally. Respiratory: Normal respiratory effort.  No retractions. Lungs CTAB. Gastrointestinal: Soft and nontender. No distention. Musculoskeletal: No lower extremity tenderness nor edema.  Neurologic:  Normal speech and language. No gross focal neurologic deficits are  appreciated.    ED Results / Procedures / Treatments   Labs (all labs ordered are listed, but only abnormal results are displayed) Labs Reviewed  COMPREHENSIVE METABOLIC PANEL - Abnormal; Notable for the following components:      Result Value   Sodium 129 (*)    Chloride 94 (*)    Glucose, Bld 101 (*)    Calcium 8.6 (*)    Albumin 3.1 (*)    All other components within normal limits  CBC - Abnormal; Notable for the following components:   RBC 2.69 (*)    Hemoglobin 8.2 (*)    HCT 23.9 (*)    Platelets 7 (*)    nRBC 2.5 (*)    All other components within normal limits  POC OCCULT BLOOD, ED  TYPE AND SCREEN  PREPARE PLATELET PHERESIS    PROCEDURES:  Critical Care performed: Yes, see critical care procedure note(s)  .Critical Care  Performed by: Chesley Noon, MD Authorized by: Chesley Noon, MD   Critical care provider statement:    Critical care time (minutes):  30   Critical care time was exclusive of:  Separately billable procedures and treating other patients and teaching time   Critical care was necessary to treat or prevent imminent or life-threatening  deterioration of the following conditions: Thrombocytopenia.   Critical care was time spent personally by me on the following activities:  Development of treatment plan with patient or surrogate, discussions with consultants, evaluation of patient's response to treatment, examination of patient, ordering and review of laboratory studies, ordering and review of radiographic studies, ordering and performing treatments and interventions, pulse oximetry, re-evaluation of patient's condition and review of old charts   I assumed direction of critical care for this patient from another provider in my specialty: no     Care discussed with: admitting provider      MEDICATIONS ORDERED IN ED: Medications  morphine (PF) 4 MG/ML injection 4 mg (has no administration in time range)     IMPRESSION / MDM / ASSESSMENT AND  PLAN / ED COURSE  I reviewed the triage vital signs and the nursing notes.                              67 y.o. adult with past medical history of hypertension, hyperlipidemia, and diabetes who presents to the ED complaining of significant thrombocytopenia found on outpatient labs.  Patient's presentation is most consistent with acute presentation with potential threat to life or bodily function.  Differential diagnosis includes, but is not limited to, thrombocytopenia, anemia, leukopenia, malignancy.  Patient well-appearing and in no acute distress, vital signs are unremarkable.  He complains only of back pain due to known metastatic disease, which we will treat with IV morphine.  No evidence of bleeding but labs do confirm severe thrombocytopenia with associated anemia but no leukopenia.  We will transfuse 2 units of platelets, case discussed with Dr. Cathie Hoops of oncology who recommends admission for stabilization prior to biopsy tomorrow.  Additional labs show mild hyponatremia, no significant AKI noted and LFTs are unremarkable.  Case discussed with hospitalist for admission.      FINAL CLINICAL IMPRESSION(S) / ED DIAGNOSES   Final diagnoses:  Thrombocytopenia (HCC)  Metastatic malignant neoplasm, unspecified site [facility])     Rx / DC Orders   ED Discharge Orders     None        Note:  This document was prepared using Dragon voice recognition software and may include unintentional dictation errors.   Chesley Noon, MD 03/24/23 [phone]

## 2023-03-24 NOTE — Telephone Encounter (Signed)
-----   Message from Chriss Czar sent at 03/24/2023  8:04 AM EST ----- Regarding: NOTICE OF APPROVAL - [employer] NOTICE OF APPROVAL - [employer] for Morphine sent to his chart.

## 2023-03-24 NOTE — Consult Note (Signed)
Hematology/Oncology Consult note [phone] [phone]      Patient Care Team: Dorothey Baseman, MD as PCP - General (Family Medicine) Rickard Patience, MD as Consulting Physician (Oncology)   Name of the patient: Kyle Hood  [id number]  10-23-1955   REASON FOR COSULTATION:  Thrombocytopenia History of presenting illness-  67 y.o. adult with PMH listed at below who was advised to go to emergency room for further evaluation for abnormal blood counts.  Patient is currently undergoing outpatient follow-up for infiltrative kidney lesions.  He has had MRI and a PET scan. Plan was to have more of biopsy as well as possible kidney lesion biopsy on 03/25/2023. Today patient had blood work done which showed rapid decline of platelet and hemoglobin.  Patient was advised to go to emergency room for further evaluation. Patient has reported constitutional symptoms, very poor oral intake, night sweats and weight loss.  He has had lower abdomen pain radiating to lower extremities. Immature platelet count 6.4.  peripheral blood flowcytometry showed 2 % B lymphoblasts .  LDH was elevated. Denies any fever, chills, nausea or vomiting.  He feels lightheaded.  He denies acute bleeding events.   Allergies  Allergen Reactions   Lisinopril Cough    Patient Active Problem List   Diagnosis Date Noted   Abnormal CT scan, kidney 02/26/2023    Priority: High   Unintentional weight loss 02/26/2023    Priority: Medium    Thrombocytopenia (HCC) 02/26/2023    Priority: Medium    Nonrheumatic tricuspid valve regurgitation 02/20/2022   Nonrheumatic mitral valve regurgitation 02/20/2022   Obesity (BMI 30.0-34.9) 11/15/2021   Elevated PSA 05/30/2021   Ingrown toenail without infection 03/08/2021   Primary insomnia 11/30/2020   High risk medication use 11/30/2020   At risk for prolonged QT interval syndrome 11/30/2020   Severe obesity (BMI 35.0-39.9) with comorbidity (HCC) 03/16/2020    MDD (major depressive disorder), recurrent, in full remission (HCC) 08/06/2019   Prediabetes 06/03/2019   Major depressive disorder, recurrent, moderate (HCC) 05/28/2019   Lymphedema 05/20/2019   Chronic venous insufficiency 05/20/2019   Abnormal fasting glucose 03/18/2019   No-show for appointment 01/26/2019   MDD (major depressive disorder), recurrent, in partial remission (HCC) 12/01/2018   Social anxiety disorder 12/01/2018   Panic disorder 12/01/2018   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 12/01/2018   Lumbar spondylosis 04/02/2018   Lumbar degenerative disc disease 04/02/2018   Lumbar facet arthropathy 04/02/2018   Strain of lumbar region 04/02/2018   Anxiety, generalized 02/06/2017   Chronic bilateral low back pain 12/18/2016   Lumbar polyradiculopathy 12/18/2016   Polyneuropathy 12/18/2016   Pain in limb 07/03/2016   DJD (degenerative joint disease) 07/03/2016   Cellulitis 06/18/2016   Elevated blood pressure reading in office with diagnosis of hypertension 10/26/2014   Lower extremity edema 10/26/2014   Hyperlipidemia 10/26/2014   Transient loss of consciousness 09/08/2014     Past Medical History:  Diagnosis Date   Abnormal liver function test    Anxiety    Anxiety    Arthritis    Chronic lower back pain    Degenerative joint disease    Depression    Diabetes mellitus without complication (HCC)    Fatty liver    Heart murmur    Hepatitis B    Hyperlipidemia    Hypertension    Lumbar polyradiculopathy    Panic attacks    Polyneuropathy    Vertigo 05/03/2017   ER     Past Surgical History:  Procedure Laterality Date   COLONOSCOPY     COLONOSCOPY WITH PROPOFOL N/A 10/01/2017   Procedure: COLONOSCOPY WITH PROPOFOL;  Surgeon: Toledo, Boykin Nearing, MD;  Location: [facility] ENDOSCOPY;  Service: Endoscopy;  Laterality: N/A;   PROSTATE BIOPSY      Social History   Socioeconomic History   Marital status: Single    Spouse name: denise   Number of children: 0   Years of  education: Not on file   Highest education level: 11th grade  Occupational History    Comment: disabled  Tobacco Use   Smoking status: Never   Smokeless tobacco: Never  Vaping Use   Vaping status: Never Used  Substance and Sexual Activity   Alcohol use: No   Drug use: Not Currently    Types: Marijuana   Sexual activity: Not Currently  Other Topics Concern   Not on file  Social History Narrative   Not on file   Social Determinants of Health   Financial Resource Strain: Patient Declined (11/15/2022)   Received from [facility]   Overall Financial Resource Strain (CARDIA)    Difficulty of Paying Living Expenses: Patient declined  Food Insecurity: No Food Insecurity (02/26/2023)   Hunger Vital Sign    Worried About Running Out of Food in the Last Year: Never true    Ran Out of Food in the Last Year: Never true  Transportation Needs: No Transportation Needs (02/26/2023)   PRAPARE - Administrator, Civil Service (Medical): No    Lack of Transportation (Non-Medical): No  Physical Activity: Inactive (03/19/2017)   Exercise Vital Sign    Days of Exercise per Week: 0 days    Minutes of Exercise per Session: 0 min  Stress: Not on file  Social Connections: Moderately Isolated (03/19/2017)   Social Connection and Isolation Panel [NHANES]    Frequency of Communication with Friends and Family: Twice a week    Frequency of Social Gatherings with Friends and Family: Never    Attends Religious Services: Never    Database administrator or Organizations: No    Attends Banker Meetings: Never    Marital Status: Living with partner  Intimate Partner Violence: Not At Risk (02/26/2023)   Humiliation, Afraid, Rape, and Kick questionnaire    Fear of Current or Ex-Partner: No    Emotionally Abused: No    Physically Abused: No    Sexually Abused: No     Family History  Problem Relation Age of Onset   Cancer Mother        Pancreatic   Diabetes  Mother    Hypertension Mother    Varicose Veins Mother    Alcohol abuse Mother    Pancreatic cancer Mother    Cancer Father        Jaw/ oral   Heart disease Father        CABG   Aortic aneurysm Father    Congestive Heart Failure Father    Hypertension Father    Varicose Veins Father    Alcohol abuse Father    Bipolar disorder Sister    Bipolar disorder Brother      Current Facility-Administered Medications:    0.9 %  sodium chloride infusion, 250 mL, Intravenous, PRN, Agbata, Tochukwu, MD   acetaminophen (TYLENOL) tablet 650 mg, 650 mg, Oral, Q6H PRN **OR** acetaminophen (TYLENOL) suppository 650 mg, 650 mg, Rectal, Q6H PRN, Agbata, Tochukwu, MD   amLODipine (NORVASC) tablet 5 mg, 5 mg, Oral, Daily, Agbata,  Tochukwu, MD   clotrimazole-betamethasone (LOTRISONE) cream, , Topical, BID, Agbata, Tochukwu, MD   cyclobenzaprine (FLEXERIL) tablet 5 mg, 5 mg, Oral, TID PRN, Agbata, Tochukwu, MD   hydrOXYzine (ATARAX) tablet 25 mg, 25 mg, Oral, Q8H PRN, Agbata, Tochukwu, MD   insulin aspart (novoLOG) injection 0-15 Units, 0-15 Units, Subcutaneous, TID WC, Agbata, Tochukwu, MD   lidocaine (LIDODERM) 5 % 1 patch, 1 patch, Transdermal, Q12H, Agbata, Tochukwu, MD   magnesium hydroxide (MILK OF MAGNESIA) suspension 5 mL, 5 mL, Oral, Daily PRN, Agbata, Tochukwu, MD   ondansetron (ZOFRAN) tablet 4 mg, 4 mg, Oral, Q6H PRN **OR** ondansetron (ZOFRAN) injection 4 mg, 4 mg, Intravenous, Q6H PRN, Agbata, Tochukwu, MD   oxyCODONE-acetaminophen (PERCOCET/ROXICET) 5-325 MG per tablet 1-2 tablet, 1-2 tablet, Oral, Q4H PRN, Agbata, Tochukwu, MD   polyethylene glycol (MIRALAX / GLYCOLAX) packet 17 g, 17 g, Oral, Daily, Agbata, Tochukwu, MD   pravastatin (PRAVACHOL) tablet 20 mg, 20 mg, Oral, Daily, Agbata, Tochukwu, MD   sodium chloride flush (NS) 0.9 % injection 3 mL, 3 mL, Intravenous, Q12H, Agbata, Tochukwu, MD   sodium chloride flush (NS) 0.9 % injection 3 mL, 3 mL, Intravenous, PRN, Agbata, Tochukwu, MD    tamsulosin (FLOMAX) capsule 0.4 mg, 0.4 mg, Oral, QPC supper, Agbata, Tochukwu, MD   traZODone (DESYREL) tablet 50 mg, 50 mg, Oral, QHS PRN, Agbata, Tochukwu, MD  Current Outpatient Medications:    amLODipine (NORVASC) 5 MG tablet, Take 5 mg by mouth daily., Disp: , Rfl:    clotrimazole-betamethasone (LOTRISONE) cream, Apply topically 2 (two) times daily., Disp: , Rfl:    Continuous Glucose Sensor (FREESTYLE LIBRE 3 SENSOR) MISC, USE 1 SENSOR EVERY 14 DAYS, Disp: , Rfl:    cyclobenzaprine (FLEXERIL) 5 MG tablet, Take 1 tablet (5 mg total) by mouth 3 (three) times daily as needed., Disp: 12 tablet, Rfl: 0   glipiZIDE (GLUCOTROL XL) 5 MG 24 hr tablet, Take 5 mg by mouth daily with breakfast., Disp: , Rfl:    hydrochlorothiazide (HYDRODIURIL) 25 MG tablet, Take 25 mg by mouth daily., Disp: , Rfl:    hydrOXYzine (ATARAX) 25 MG tablet, Take 1 tablet (25 mg total) by mouth every 8 (eight) hours as needed for anxiety., Disp: 90 tablet, Rfl: 1   lidocaine (LIDODERM) 5 %, Place 1 patch onto the skin every 12 (twelve) hours. Remove & Discard patch within 12 hours or as directed by MD, Disp: 10 patch, Rfl: 0   magnesium hydroxide (MILK OF MAGNESIA) 400 MG/5ML suspension, Take 5 mLs by mouth daily as needed for mild constipation., Disp: , Rfl:    morphine (MS CONTIN) 15 MG 12 hr tablet, Take 1 tablet (15 mg total) by mouth every 12 (twelve) hours. (Patient not taking: Reported on 03/24/2023), Disp: 60 tablet, Rfl: 0   naproxen (NAPROSYN) 500 MG tablet, TK 1 T PO BID WC, Disp: , Rfl: 3   oxyCODONE-acetaminophen (PERCOCET) 5-325 MG tablet, Take 1-2 tablets by mouth every 4 (four) hours as needed for severe pain (pain score 7-10)., Disp: , Rfl:    polyethylene glycol (MIRALAX / GLYCOLAX) 17 g packet, Take 17 g by mouth daily., Disp: , Rfl:    pravastatin (PRAVACHOL) 20 MG tablet, Take 20 mg by mouth daily., Disp: , Rfl:    spironolactone (ALDACTONE) 25 MG tablet, , Disp: , Rfl:    tamsulosin (FLOMAX) 0.4 MG  CAPS capsule, TAKE 1 CAPSULE(0.4 MG) BY MOUTH DAILY, Disp: 90 capsule, Rfl: 3   TRADJENTA 5 MG TABS tablet, Take 5 mg  by mouth daily., Disp: , Rfl:    traZODone (DESYREL) 50 MG tablet, Take 1 tablet (50 mg total) by mouth at bedtime as needed for sleep. Stop Quetiapine, Disp: 90 tablet, Rfl: 1   valsartan (DIOVAN) 80 MG tablet, Take 80 mg by mouth 2 (two) times daily., Disp: , Rfl:   Review of Systems  Constitutional:  Positive for appetite change, fatigue and unexpected weight change. Negative for chills and fever.  HENT:   Negative for hearing loss and voice change.   Eyes:  Negative for eye problems and icterus.  Respiratory:  Negative for chest tightness, cough and shortness of breath.   Cardiovascular:  Negative for chest pain and leg swelling.  Gastrointestinal:  Positive for abdominal pain. Negative for abdominal distention.  Endocrine: Negative for hot flashes.       Night sweats  Genitourinary:  Negative for difficulty urinating, dysuria and frequency.   Musculoskeletal:  Negative for arthralgias.  Skin:  Negative for itching and rash.  Neurological:  Negative for light-headedness and numbness.  Hematological:  Negative for adenopathy. Does not bruise/bleed easily.  Psychiatric/Behavioral:  Negative for confusion.     PHYSICAL EXAM Vitals:   03/24/23 1147 03/24/23 1345 03/24/23 1429 03/24/23 1445  BP: 100/61 (!) 129/59 (!) 124/56 132/68  Pulse: 84 79 77 89  Resp: 20 18 18 19   Temp: 97.8 F (36.6 C)  98.3 F (36.8 C) 98.3 F (36.8 C)  TempSrc: Axillary  Oral Oral  SpO2: 99% 99% 97% 96%   Physical Exam Constitutional:      General: He is not in acute distress.    Appearance: He is obese. He is not diaphoretic.  HENT:     Head: Normocephalic and atraumatic.  Eyes:     General: No scleral icterus. Cardiovascular:     Rate and Rhythm: Normal rate and regular rhythm.     Heart sounds: No murmur heard. Pulmonary:     Effort: Pulmonary effort is normal. No respiratory  distress.     Breath sounds: No wheezing.  Abdominal:     General: There is no distension.     Palpations: Abdomen is soft.     Tenderness: There is no abdominal tenderness.  Musculoskeletal:        General: Normal range of motion.     Cervical back: Normal range of motion and neck supple.  Skin:    General: Skin is warm and dry.     Findings: No erythema.     Comments: Petechia bilateral lower extremities  Neurological:     Mental Status: He is alert and oriented to person, place, and time. Mental status is at baseline.     Cranial Nerves: No cranial nerve deficit.     Motor: No abnormal muscle tone.  Psychiatric:        Mood and Affect: Mood and affect normal.       LABORATORY STUDIES    Latest Ref Rng & Units 03/24/2023   11:54 AM 03/24/2023   10:04 AM 03/06/2023    6:40 PM  CBC  WBC 4.0 - 10.5 K/uL 4.4  3.8  7.9   Hemoglobin 13.0 - 17.0 g/dL 8.2  7.8  40.9   Hematocrit 39.0 - 52.0 % 23.9  23.5  36.4   Platelets 150 - 400 K/uL 7  8  44       Latest Ref Rng & Units 03/24/2023   11:54 AM 03/24/2023   10:04 AM 03/06/2023    6:40 PM  CMP  Glucose 70 - 99 mg/dL 161  096  045   BUN 8 - 23 mg/dL 21  22  15    Creatinine 0.61 - 1.24 mg/dL 4.09  8.11  9.14   Sodium 135 - 145 mmol/L 129  130  130   Potassium 3.5 - 5.1 mmol/L 4.2  4.2  3.9   Chloride 98 - 111 mmol/L 94  95  95   CO2 22 - 32 mmol/L 27  24  22    Calcium 8.9 - 10.3 mg/dL 8.6  8.5  8.9   Total Protein 6.5 - 8.1 g/dL 7.2  6.9  8.0   Total Bilirubin <1.2 mg/dL 0.6  0.6  1.0   Alkaline Phos 38 - 126 U/L 105  104  99   AST 15 - 41 U/L 33  33  45   ALT 0 - 44 U/L 16  16  20       RADIOGRAPHIC STUDIES: I have personally reviewed the radiological images as listed and agreed with the findings in the report. NM PET Image Initial (PI) Skull Base To Thigh  Result Date: 03/10/2023 CLINICAL DATA:  Initial treatment strategy for renal lesions and abnormal blood smear. EXAM: NUCLEAR MEDICINE PET SKULL BASE TO THIGH  TECHNIQUE: 12.69 mCi F-18 FDG was injected intravenously. Full-ring PET imaging was performed from the skull base to thigh after the radiotracer. CT data was obtained and used for attenuation correction and anatomic localization. Fasting blood glucose: 173 mg/dl COMPARISON:  Prior recent CT and MRI examinations. FINDINGS: Mediastinal blood pool activity: SUV max 2.06 Liver activity: SUV max 4.82 NECK: Single small hypermetabolic focus associated with the left submandibular gland. Possible muscle lesion or adjacent lymph node. No other significant findings in the neck. Incidental CT findings: None. CHEST: Lytic destructive hypermetabolic bone lesion involving the anterior aspect of the first rib with an associated soft tissue component. SUV max is 9.0. No enlarged or hypermetabolic mediastinal or hilar lymph nodes. No hypermetabolic axillary or supraclavicular nodes. Small hypermetabolic subpleural nodular density in the superior segment of the right lower lobe. SUV max is 4.22. Possible pleural lymph node. A few other small scattered pulmonary nodules are noted on the CT scan. Incidental CT findings: Incidental aberrant right subclavian artery. Scattered coronary artery calcifications. Calcifications noted around the aortic valve. ABDOMEN/PELVIS: No hypermetabolic hepatic or splenic lesions. The spleen is borderline enlarged and demonstrates SUV max of 4.91, slightly greater than that of the liver. Difficult to identify any definite renal lesions due to the markedly hypermetabolic collecting systems but they do appear to be somewhat heterogeneous. Numerous small mesenteric lymph nodes as seen on the prior CT scan. These demonstrate hypermetabolism. Index node on image 105/6 measures 14 mm and has an SUV max of 6.05. 9 mm node on image 29/6 has an SUV max of 6.32 No retroperitoneal or pelvic lymphadenopathy. No inguinal lymphadenopathy. There Incidental CT findings: Scattered aortic and iliac artery calcifications.  Small bilateral renal calculi. Enlarged prostate gland with median lobe hypertrophy impressing on the base of the bladder. SKELETON: Diffuse hypermetabolic bone disease without obvious CT correlate except involving the right first anterior rib which appears partially destroyed with an adjacent soft tissue mass. Left sacral disease has an SUV max of 6.41. Left-sided L5 lesion has an SUV max of 7.6. T4 lesion has an SUV max of 7.03. Right femoral head lesion has an SUV max of 6.9. Incidental CT findings: None. IMPRESSION: 1. Diffuse hypermetabolic bone disease without obvious CT correlate except  involving the right first anterior rib which appears partially destroyed with an adjacent soft tissue mass. Findings consistent with diffuse osseous lymphoma. 2. Numerous small hypermetabolic mesenteric lymph nodes. 3. 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 4. Borderline enlarged spleen with mild hypermetabolism. 5. Small hypermetabolic subpleural nodular density in the superior segment of the right lower lobe. Possible pleural lymph node. 6. Small hypermetabolic focus associated with the left submandibular gland. Possible muscle lesion or adjacent lymph node. 7. Aortic atherosclerosis. Aortic Atherosclerosis (ICD10-I70.0). Electronically Signed   By: Rudie Meyer M.D.   On: 03/10/2023 12:43   CT ABDOMEN PELVIS W CONTRAST  Result Date: 03/07/2023 CLINICAL DATA:  Acute nonlocalized abdominal pain, back pain EXAM: CT ABDOMEN AND PELVIS WITH CONTRAST TECHNIQUE: Multidetector CT imaging of the abdomen and pelvis was performed using the standard protocol following bolus administration of intravenous contrast. RADIATION DOSE REDUCTION: This exam was performed according to the departmental dose-optimization program which includes automated exposure control, adjustment of the  mA and/or kV according to patient size and/or use of iterative reconstruction technique. CONTRAST:  OMNIPAQUE IOHEXOL 350 MG/ML SOLN COMPARISON:  MRI 02/28/2023, CT 02/23/2023 FINDINGS: Lower chest: No acute abnormality. Hepatobiliary: No focal liver abnormality is seen. No gallstones, gallbladder wall thickening, or biliary dilatation. Pancreas: Unremarkable Spleen: Normal in size without focal abnormality. Adrenals/Urinary Tract: The adrenal glands are unremarkable. The kidneys are normal in size and position. There is heterogeneous cortical enhancement bilaterally, identical to prior examinations of 02/23/2023 and 02/17/2023 with areas of corresponding restricted diffusion and hypoenhancement noted on a prior MRI examination suspicious for lymphomatous or leukemic infiltration. No hydronephrosis. No perinephric inflammatory stranding or fluid collections are seen. No intrarenal or ureteral calculi are identified. The bladder is unremarkable. Stomach/Bowel: Stomach, small bowel, and large bowel are unremarkable. Appendix normal. No free intraperitoneal gas or fluid. Vascular/Lymphatic: Shotty adenopathy is again seen within the mesenteric root measuring up to 10 mm in short axis diameter. No additional pathologic adenopathy within the abdomen and pelvis. Mild aortoiliac atherosclerotic calcification. No aortic aneurysm. Reproductive: Marked prostatic hypertrophy. Other: No abdominal wall hernia Musculoskeletal: 6 no acute bone abnormality. No lytic or blastic bone lesion. IMPRESSION: 1. No acute intra-abdominal pathology identified. No definite radiographic explanation for the patient's reported symptoms. 2. Heterogeneous cortical enhancement bilaterally, identical to prior examinations of 02/23/2023 and 02/17/2023 with areas of corresponding restricted diffusion and hypoenhancement noted on a prior MRI examination suspicious for lymphomatous or leukemic infiltration. PET CT examination or percutaneous  needle biopsy may be helpful for further evaluation 3. Shotty adenopathy within the mesenteric root, unchanged. 4. Marked prostatic hypertrophy. Aortic Atherosclerosis (ICD10-I70.0). Electronically Signed   By: Helyn Numbers M.D.   On: 03/07/2023 02:17   MR Abdomen W Wo Contrast  Result Date: 02/28/2023 CLINICAL DATA:  Abnormal appearance of the kidneys on prior CT EXAM: MRI ABDOMEN WITHOUT AND WITH CONTRAST TECHNIQUE: Multiplanar multisequence MR imaging of the abdomen was performed both before and after the administration of intravenous contrast. CONTRAST:  10mL GADAVIST GADOBUTROL 1 MMOL/ML IV SOLN COMPARISON:  CT abdomen pelvis, 02/23/2023 FINDINGS: Lower chest: No acute abnormality. Hepatobiliary: No solid liver abnormality is seen. Hepatic steatosis. No gallstones, gallbladder wall thickening, or biliary dilatation. Pancreas: Unremarkable. No pancreatic ductal dilatation or surrounding inflammatory changes. Spleen: Normal in size without significant abnormality. Adrenals/Urinary Tract: Adrenal glands are unremarkable. Simple, benign cyst of  the superior pole of the left kidney, for which no specific further follow-up or characterization is required. Unusual, extensively heterogeneous appearance of the renal parenchyma best appreciated on diffusion-weighted sequences (series 8, image 109), and to a lesser extent early arterial phases of contrast enhancement (series 14, image 72). This is otherwise very difficult to appreciate. No obvious calculi, individual discrete lesion, or hydronephrosis. Stomach/Bowel: Stomach is within normal limits. No evidence of bowel wall thickening, distention, or inflammatory changes. Vascular/Lymphatic: Aortic atherosclerosis. No enlarged abdominal or pelvic lymph nodes. Other: No abdominal wall hernia or abnormality. No ascites. Musculoskeletal: Diffusely heterogeneous enhancement of the vertebral bone marrow (series 20, image 23). IMPRESSION: 1. Unusual, extensively  heterogeneous appearance of the renal parenchyma best appreciated on diffusion-weighted sequences, and to a lesser extent early arterial phases of contrast enhancement. This appearance otherwise very difficult to appreciate on remaining sequences. No individual discrete lesion. This is of uncertain significance, and as previously reported and discussed, differential considerations would specifically include infiltrative processes such as renal lymphomatous involvement or metastatic disease. Multifocal pyelonephritis could also have this appearance if there are clinically referable signs and symptoms and urinalysis evidence. 2. Diffusely heterogeneous enhancement of the vertebral bone marrow, which is worrisome for osseous metastatic disease, particularly in light of findings described above. 3. Hepatic steatosis. These results will be called to the ordering clinician or representative by the Radiologist Assistant, and communication documented in the PACS or [employer]. Aortic Atherosclerosis (ICD10-I70.0). Electronically Signed   By: Jearld Lesch M.D.   On: 02/28/2023 13:43   CT ABDOMEN PELVIS W CONTRAST  Result Date: 02/23/2023 CLINICAL DATA:  67 year old male with history of right lower quadrant abdominal pain. EXAM: CT ABDOMEN AND PELVIS WITH CONTRAST TECHNIQUE: Multidetector CT imaging of the abdomen and pelvis was performed using the standard protocol following bolus administration of intravenous contrast. RADIATION DOSE REDUCTION: This exam was performed according to the departmental dose-optimization program which includes automated exposure control, adjustment of the mA and/or kV according to patient size and/or use of iterative reconstruction technique. CONTRAST:  OMNIPAQUE IOHEXOL 300 MG/ML  SOLN COMPARISON:  CT of the abdomen and pelvis 02/17/2023. FINDINGS: Lower chest: Unremarkable. Hepatobiliary: No suspicious cystic or solid hepatic lesions. No intra or extrahepatic biliary ductal  dilatation. Gallbladder is unremarkable in appearance. Pancreas: No pancreatic mass. No pancreatic ductal dilatation. No pancreatic or peripancreatic fluid collections or inflammatory changes. Spleen: Unremarkable. Adrenals/Urinary Tract: The appearance of the kidneys is once again unusual with multiple poorly defined hypovascular areas, grossly similar to the recent prior examination. These are not clearly simple cysts, and these do not appear particularly wedge-shaped (review of the patient's chart demonstrates no leukocytes in the urine on urinalysis 02/17/2023 at the time of the recently obtained CT examination), suggesting that these do not in fact represent perfusion abnormalities associated with pyelonephritis, and are rather concerning for infiltrative lesions, including potential metastatic lesions. The largest of these is noted in the interpolar region of the right kidney measuring up to 1.9 cm in diameter (axial image 39 of series 2). No hydroureteronephrosis. Urinary bladder is normal in appearance. Bilateral adrenal glands are normal in appearance. Stomach/Bowel: The appearance of the stomach is normal. No pathologic dilatation of small bowel or colon. Normal appendix. Vascular/Lymphatic: Atherosclerosis in the abdominal aorta and pelvic vasculature. No lymphadenopathy noted in the abdomen or pelvis. Reproductive: Prostate gland and seminal vesicles are unremarkable in appearance. Other: No significant volume of ascites.  No pneumoperitoneum. Musculoskeletal: There are no aggressive appearing lytic or  blastic lesions noted in the visualized portions of the skeleton. IMPRESSION: 1. No acute findings are noted in the abdomen or pelvis to account for the patient's symptoms. 2. However, there is a persistently abnormal appearance of the kidneys bilaterally, with multiple persistent hypovascular lesions, the appearance of which raises concern for potential metastatic disease to the kidneys or renal lymphoma.  Further clinical evaluation is recommended. 3. Aortic atherosclerosis. Electronically Signed   By: Trudie Reed M.D.   On: 02/23/2023 09:31   CT ABDOMEN PELVIS W CONTRAST  Result Date: 02/17/2023 CLINICAL DATA:  Upper abdominal pain. EXAM: CT ABDOMEN AND PELVIS WITH CONTRAST TECHNIQUE: Multidetector CT imaging of the abdomen and pelvis was performed using the standard protocol following bolus administration of intravenous contrast. RADIATION DOSE REDUCTION: This exam was performed according to the departmental dose-optimization program which includes automated exposure control, adjustment of the mA and/or kV according to patient size and/or use of iterative reconstruction technique. CONTRAST:  OMNIPAQUE IOHEXOL 300 MG/ML  SOLN COMPARISON:  None Available. FINDINGS: Lower chest: No basilar airspace disease or pleural effusion. Hepatobiliary: Diffusely decreased hepatic density typical of steatosis. No focal liver abnormality. Gallbladder physiologically distended, no calcified stone. No biliary dilatation. Pancreas: No ductal dilatation or inflammation. No evidence of pancreatic mass. Spleen: Normal in size without focal abnormality. Adrenals/Urinary Tract: Normal adrenal glands. Heterogeneous right renal enhancement with faint right perinephric edema. There is slight heterogeneous left renal enhancement in the upper pole to a lesser extent. No hydronephrosis. No visualized urolithiasis. Decompressed ureters. Mild bladder distension but no wall thickening or perivesicular inflammation Stomach/Bowel: Small hiatal hernia. No bowel obstruction or inflammation. Normal appendix. Small to moderate volume of colonic stool. Sigmoid colon is redundant. Vascular/Lymphatic: No acute vascular findings. Normal caliber abdominal aorta. Mild atherosclerosis. Patent portal and splenic veins. There are multiple prominent lymph nodes in the central small bowel mesentery measuring up to 11 mm. Reproductive: Enlarged  prostate spans 5.5 cm. Other: No free air, free fluid, or intra-abdominal fluid collection. Musculoskeletal: Lumbar spondylosis with spurring and lower lumbar facet hypertrophy. There is a prominent Schmorl's node involving the inferior endplate of L2. IMPRESSION: 1. Heterogeneous right renal enhancement with faint right perinephric edema, suspicious for pyelonephritis. There is slight heterogeneous left renal enhancement in the upper pole to a lesser extent. Recommend correlation with urinalysis. 2. Multiple prominent lymph nodes in the central small bowel mesentery, likely reactive, but nonspecific. Consider follow-up CT after a course of treatment to ensure resolution. 3. Hepatic steatosis. 4. Small hiatal hernia. 5. Enlarged prostate. Aortic Atherosclerosis (ICD10-I70.0). Electronically Signed   By: Narda Rutherford M.D.   On: 02/17/2023 21:34   DG Chest 2 View  Result Date: 02/16/2023 CLINICAL DATA:  Chest pain radiating to the abdomen and shoulders. EXAM: CHEST - 2 VIEW COMPARISON:  02/04/2021 FINDINGS: Normal heart size and pulmonary vascularity. No focal airspace disease or consolidation in the lungs. No blunting of costophrenic angles. No pneumothorax. Mediastinal contours appear intact. Degenerative changes in the spine. IMPRESSION: No active cardiopulmonary disease. Electronically Signed   By: Burman Nieves M.D.   On: 02/16/2023 02:27     Assessment and plan-   Abnormal CT/MRI/PET scan-possible infiltrative kidney lesions. Plan was to obtain CT-guided renal lesion biopsy tomorrow if platelet count is above 50,000 Risks of kidney hematoma was discussed with patient.  # Anemia and thrombocytopenia, progressively worse.  Normal immature platelet fraction, indicating decreased marrow production. Recent blood work showed a normal B12 and folate. peripheral blood flowcytometry showed 2 %  B lymphoblasts elevated LDH. Suspect lymphoma involvement in the marrow. Discussed with IR, patient will  have bone marrow biopsy,. Transfuse platelet count 2 unit today.  Repeat count in AM.  Recommend patient to get additional platelet transfusion with a goal of 50,000 so that patient may get renal biopsy.  Thank you for allowing me to participate in the care of this patient.   Rickard Patience, MD, PhD Hematology Oncology 03/24/2023

## 2023-03-25 ENCOUNTER — Observation Stay: Payer: Medicare HMO | Admitting: Radiology

## 2023-03-25 ENCOUNTER — Ambulatory Visit: Admission: RE | Admit: 2023-03-25 | Payer: Medicare HMO | Source: Ambulatory Visit | Admitting: Radiology

## 2023-03-25 ENCOUNTER — Ambulatory Visit
Admission: RE | Admit: 2023-03-25 | Discharge: 2023-03-25 | Disposition: A | Discharge status: Home / Self Care | Payer: Medicare HMO | Source: Ambulatory Visit | Attending: Oncology | Admitting: Oncology

## 2023-03-25 ENCOUNTER — Inpatient Hospital Stay: Payer: Medicare HMO

## 2023-03-25 DIAGNOSIS — E669 Obesity, unspecified: Secondary | ICD-10-CM | POA: Diagnosis present

## 2023-03-25 DIAGNOSIS — Z9889 Other specified postprocedural states: Secondary | ICD-10-CM | POA: Diagnosis not present

## 2023-03-25 DIAGNOSIS — C799 Secondary malignant neoplasm of unspecified site: Secondary | ICD-10-CM | POA: Diagnosis not present

## 2023-03-25 DIAGNOSIS — C8599 Non-Hodgkin lymphoma, unspecified, extranodal and solid organ sites: Secondary | ICD-10-CM | POA: Diagnosis present

## 2023-03-25 DIAGNOSIS — I10 Essential (primary) hypertension: Secondary | ICD-10-CM | POA: Diagnosis present

## 2023-03-25 DIAGNOSIS — Z818 Family history of other mental and behavioral disorders: Secondary | ICD-10-CM | POA: Diagnosis not present

## 2023-03-25 DIAGNOSIS — E785 Hyperlipidemia, unspecified: Secondary | ICD-10-CM | POA: Diagnosis present

## 2023-03-25 DIAGNOSIS — N2889 Other specified disorders of kidney and ureter: Secondary | ICD-10-CM | POA: Diagnosis present

## 2023-03-25 DIAGNOSIS — Z8249 Family history of ischemic heart disease and other diseases of the circulatory system: Secondary | ICD-10-CM | POA: Diagnosis not present

## 2023-03-25 DIAGNOSIS — Z79899 Other long term (current) drug therapy: Secondary | ICD-10-CM | POA: Diagnosis not present

## 2023-03-25 DIAGNOSIS — Z811 Family history of alcohol abuse and dependence: Secondary | ICD-10-CM | POA: Diagnosis not present

## 2023-03-25 DIAGNOSIS — G8929 Other chronic pain: Secondary | ICD-10-CM | POA: Diagnosis present

## 2023-03-25 DIAGNOSIS — D72819 Decreased white blood cell count, unspecified: Secondary | ICD-10-CM | POA: Diagnosis present

## 2023-03-25 DIAGNOSIS — K760 Fatty (change of) liver, not elsewhere classified: Secondary | ICD-10-CM | POA: Diagnosis present

## 2023-03-25 DIAGNOSIS — E871 Hypo-osmolality and hyponatremia: Secondary | ICD-10-CM | POA: Diagnosis present

## 2023-03-25 DIAGNOSIS — D649 Anemia, unspecified: Secondary | ICD-10-CM | POA: Diagnosis present

## 2023-03-25 DIAGNOSIS — Z6834 Body mass index (BMI) 34.0-34.9, adult: Secondary | ICD-10-CM | POA: Diagnosis not present

## 2023-03-25 DIAGNOSIS — Z7984 Long term (current) use of oral hypoglycemic drugs: Secondary | ICD-10-CM | POA: Diagnosis not present

## 2023-03-25 DIAGNOSIS — D696 Thrombocytopenia, unspecified: Secondary | ICD-10-CM | POA: Diagnosis present

## 2023-03-25 DIAGNOSIS — E119 Type 2 diabetes mellitus without complications: Secondary | ICD-10-CM | POA: Diagnosis present

## 2023-03-25 DIAGNOSIS — M545 Low back pain, unspecified: Secondary | ICD-10-CM | POA: Diagnosis present

## 2023-03-25 DIAGNOSIS — Z555 Less than a high school diploma: Secondary | ICD-10-CM | POA: Diagnosis not present

## 2023-03-25 DIAGNOSIS — F3341 Major depressive disorder, recurrent, in partial remission: Secondary | ICD-10-CM | POA: Diagnosis present

## 2023-03-25 DIAGNOSIS — Z800 Family history of malignant neoplasm of digestive organs: Secondary | ICD-10-CM | POA: Diagnosis not present

## 2023-03-25 DIAGNOSIS — Z833 Family history of diabetes mellitus: Secondary | ICD-10-CM | POA: Diagnosis not present

## 2023-03-25 HISTORY — PX: IR BONE MARROW BIOPSY & ASPIRATION: IMG5727

## 2023-03-25 LAB — CBC
HCT: 21.9 % — ABNORMAL LOW (ref 39.0–52.0)
HCT: 23.1 % — ABNORMAL LOW (ref 39.0–52.0)
Hemoglobin: 7.3 g/dL — ABNORMAL LOW (ref 13.0–17.0)
Hemoglobin: 8.1 g/dL — ABNORMAL LOW (ref 13.0–17.0)
MCH: 30.7 pg (ref 26.0–34.0)
MCH: 31.4 pg (ref 26.0–34.0)
MCHC: 33.3 g/dL (ref 30.0–36.0)
MCHC: 35.1 g/dL (ref 30.0–36.0)
MCV: 89.5 fL (ref 80.0–100.0)
MCV: 92 fL (ref 80.0–100.0)
Platelets: 21 10*3/uL — CL (ref 150–400)
Platelets: 32 10*3/uL — ABNORMAL LOW (ref 150–400)
RBC: 2.38 MIL/uL — ABNORMAL LOW (ref 4.22–5.81)
RBC: 2.58 MIL/uL — ABNORMAL LOW (ref 4.22–5.81)
RDW: 14.6 % (ref 11.5–15.5)
RDW: 14.8 % (ref 11.5–15.5)
WBC: 3.7 10*3/uL — ABNORMAL LOW (ref 4.0–10.5)
WBC: 4 10*3/uL (ref 4.0–10.5)
nRBC: 2.7 % — ABNORMAL HIGH (ref 0.0–0.2)
nRBC: 3 % — ABNORMAL HIGH (ref 0.0–0.2)

## 2023-03-25 LAB — CBC WITH DIFFERENTIAL/PLATELET
Abs Immature Granulocytes: 0.57 10*3/uL — ABNORMAL HIGH (ref 0.00–0.07)
Abs Immature Granulocytes: 0.74 10*3/uL — ABNORMAL HIGH (ref 0.00–0.07)
Basophils Absolute: 0 10*3/uL (ref 0.0–0.1)
Basophils Absolute: 0 10*3/uL (ref 0.0–0.1)
Basophils Relative: 1 %
Basophils Relative: 1 %
Eosinophils Absolute: 0 10*3/uL (ref 0.0–0.5)
Eosinophils Absolute: 0 10*3/uL (ref 0.0–0.5)
Eosinophils Relative: 1 %
Eosinophils Relative: 0 %
HCT: 22.9 % — ABNORMAL LOW (ref 39.0–52.0)
HCT: 23.5 % — ABNORMAL LOW (ref 39.0–52.0)
Hemoglobin: 7.8 g/dL — ABNORMAL LOW (ref 13.0–17.0)
Hemoglobin: 8.1 g/dL — ABNORMAL LOW (ref 13.0–17.0)
Immature Granulocytes: 15 %
Immature Granulocytes: 16 %
Lymphocytes Relative: 35 %
Lymphocytes Relative: 33 %
Lymphs Abs: 1.4 10*3/uL (ref 0.7–4.0)
Lymphs Abs: 1.6 10*3/uL (ref 0.7–4.0)
MCH: 31 pg (ref 26.0–34.0)
MCH: 30.7 pg (ref 26.0–34.0)
MCHC: 34.1 g/dL (ref 30.0–36.0)
MCHC: 34.5 g/dL (ref 30.0–36.0)
MCV: 90.9 fL (ref 80.0–100.0)
MCV: 89 fL (ref 80.0–100.0)
Monocytes Absolute: 0.3 10*3/uL (ref 0.1–1.0)
Monocytes Absolute: 0.6 10*3/uL (ref 0.1–1.0)
Monocytes Relative: 8 %
Monocytes Relative: 12 %
Neutro Abs: 1.6 10*3/uL — ABNORMAL LOW (ref 1.7–7.7)
Neutro Abs: 1.8 10*3/uL (ref 1.7–7.7)
Neutrophils Relative %: 40 %
Neutrophils Relative %: 38 %
Platelets: 22 10*3/uL — CL (ref 150–400)
Platelets: 22 10*3/uL — CL (ref 150–400)
RBC: 2.52 MIL/uL — ABNORMAL LOW (ref 4.22–5.81)
RBC: 2.64 MIL/uL — ABNORMAL LOW (ref 4.22–5.81)
RDW: 14.6 % (ref 11.5–15.5)
RDW: 14.7 % (ref 11.5–15.5)
Smear Review: NORMAL
Smear Review: NORMAL
WBC: 3.9 10*3/uL — ABNORMAL LOW (ref 4.0–10.5)
WBC: 4.7 10*3/uL (ref 4.0–10.5)
nRBC: 2.8 % — ABNORMAL HIGH (ref 0.0–0.2)
nRBC: 2.5 % — ABNORMAL HIGH (ref 0.0–0.2)

## 2023-03-25 LAB — GLUCOSE, CAPILLARY
Glucose-Capillary: 121 mg/dL — ABNORMAL HIGH (ref 70–99)
Glucose-Capillary: 133 mg/dL — ABNORMAL HIGH (ref 70–99)
Glucose-Capillary: 93 mg/dL (ref 70–99)
Glucose-Capillary: 83 mg/dL (ref 70–99)

## 2023-03-25 LAB — PREPARE PLATELET PHERESIS: Unit division: 0

## 2023-03-25 LAB — BPAM PLATELET PHERESIS
Blood Product Expiration Date: 202411272359
ISSUE DATE / TIME: 202411251425
Unit Type and Rh: 5100

## 2023-03-25 LAB — BASIC METABOLIC PANEL
Anion gap: 9 (ref 5–15)
BUN: 18 mg/dL (ref 8–23)
CO2: 26 mmol/L (ref 22–32)
Calcium: 8.3 mg/dL — ABNORMAL LOW (ref 8.9–10.3)
Chloride: 98 mmol/L (ref 98–111)
Creatinine, Ser: 1.06 mg/dL (ref 0.61–1.24)
GFR, Estimated: >60 mL/min (ref >60)
Glucose, Bld: 118 mg/dL — ABNORMAL HIGH (ref 70–99)
Potassium: 4.2 mmol/L (ref 3.5–5.1)
Sodium: 133 mmol/L — ABNORMAL LOW (ref 135–145)

## 2023-03-25 LAB — CBG MONITORING, ED: Glucose-Capillary: 114 mg/dL — ABNORMAL HIGH (ref 70–99)

## 2023-03-25 LAB — PROTIME-INR
INR: 1.2 (ref 0.8–1.2)
Prothrombin Time: 15.4 s — ABNORMAL HIGH (ref 11.4–15.2)

## 2023-03-25 MED ORDER — DEXAMETHASONE 4 MG PO TABS
20.0000 mg | ORAL_TABLET | Freq: Once | ORAL | Status: AC
  Administered 2023-03-25: 20 mg via ORAL
  Filled 2023-03-25: qty 2

## 2023-03-25 MED ORDER — MIDAZOLAM HCL 2 MG/2ML IJ SOLN
INTRAMUSCULAR | Status: AC
Start: 2023-03-25 — End: ?
  Filled 2023-03-25: qty 2

## 2023-03-25 MED ORDER — MIDAZOLAM HCL 2 MG/2ML IJ SOLN
INTRAMUSCULAR | Status: AC | PRN
  Administered 2023-03-25: 1 mg via INTRAVENOUS

## 2023-03-25 MED ORDER — ENSURE ENLIVE PO LIQD
237.0000 mL | Freq: Two times a day (BID) | ORAL | Status: DC
  Administered 2023-03-26: 237 mL via ORAL

## 2023-03-25 MED ORDER — SODIUM CHLORIDE 0.9 % IV SOLN: Freq: Once | INTRAVENOUS | Status: AC

## 2023-03-25 MED ORDER — FENTANYL CITRATE (PF) 100 MCG/2ML IJ SOLN
INTRAMUSCULAR | Status: AC
  Filled 2023-03-25: qty 2

## 2023-03-25 MED ORDER — ACETAMINOPHEN 325 MG PO TABS: 325.0000 mg | ORAL_TABLET | Freq: Four times a day (QID) | ORAL | Status: DC | PRN

## 2023-03-25 MED ORDER — FENTANYL CITRATE (PF) 100 MCG/2ML IJ SOLN
INTRAMUSCULAR | Status: AC | PRN
  Administered 2023-03-25: 50 ug via INTRAVENOUS

## 2023-03-25 MED ORDER — ACETAMINOPHEN 325 MG RE SUPP: 325.0000 mg | Freq: Four times a day (QID) | RECTAL | Status: DC | PRN

## 2023-03-25 MED ORDER — HEPARIN SOD (PORK) LOCK FLUSH 100 UNIT/ML IV SOLN
INTRAVENOUS | Status: AC
  Filled 2023-03-25: qty 5

## 2023-03-25 MED ORDER — OXYCODONE-ACETAMINOPHEN 5-325 MG PO TABS
2.0000 | ORAL_TABLET | Freq: Four times a day (QID) | ORAL | Status: DC | PRN
  Administered 2023-03-26: 2 via ORAL
  Filled 2023-03-25: qty 2

## 2023-03-25 MED ORDER — SODIUM CHLORIDE 0.9 % IV SOLN: INTRAVENOUS | Status: AC

## 2023-03-25 NOTE — Progress Notes (Signed)
Patient received versed 1mg  and fentanyl for procedure. Patient's vital signs remained stable. Report given to [facility].

## 2023-03-25 NOTE — Procedures (Signed)
Interventional Radiology Procedure Note  Procedure: Image guided aspirate and core biopsy of right posterior iliac bone Complications: None Recommendations: - Bedrest supine x 1 hrs then ambulate per primary order - OTC's PRN  Pain - Follow biopsy results - ok to advance diet per primary order  Signed,  Yvone Neu. Loreta Ave, DO, ABVM, RPVI

## 2023-03-25 NOTE — Progress Notes (Signed)
Progress Note   Patient: Kyle Hood WUJ:[id number] DOB: 03/08/1956 DOA: 03/24/2023     0 DOS: the patient was seen and examined on 03/25/2023   Brief hospital course:  Pat Kyle Hood is a 67 y.o. 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 Patient notes that he has had worsening low back pain associated with night sweats over the last several months and is being seen by oncology.  Patient is currently being worked up by oncology and is scheduled for a bone marrow and renal biopsy in a.m. He had lab work done 1 day prior to the biopsy and was found to have a platelet count of 9.  He denies having any rectal bleeding, no hematuria but states that he had hemoptysis 1 week prior to presenting to the ER.  He complains of a poor appetite, a 10 to 15 pound weight loss over the last several weeks and low back pain with occasional radiation to his hips He denies feeling dizzy or lightheaded, he denies having any headache, no abdominal pain, no changes in his bowel habits, no blurred vision, no cough, no fever, no chills, no focal deficit. Abnormal labs include a hemoglobin of 8.2, platelet count of 7, sodium of 129 2 bags of platelets were ordered in the ER and patient is currently being transfused        Assessment and Plan: * Thrombocytopenia [facility]) Appreciate hematology/oncology input Per hematology patient noted to have normal immature platelet fraction, indicating decreased marrow production.  Peripheral blood flow cytometry showed 2 % B lymphoblasts elevated LDH. Suspect lymphoma involvement in the marrow. No evidence of bleeding Patient was transfused 2 bags of platelets on 11/25 with marginal increase in platelet count 7K >> 22K For  bone marrow biopsy today. Will transfuse 2 more bags of platelets on 11/26 to get platelet count of 50,000 so patient can have a renal biopsy     Metastasis (HCC) Concern for possible metastatic disease MRI abdomen confirmed unusual, extensively heterogeneous appearance of the renal parenchyma, concerning for  infiltrative processes such as renal lymphomatous involvement or metastatic disease. Patient is scheduled for renal biopsy in a.m. if platelet count is over 50K    Obesity (BMI 30.0-34.9) BMI 34 Complicates overall prognosis and care Lifestyle modification and exercise has been discussed with patient in detail    MDD (major depressive disorder), recurrent, in partial remission (HCC) Continue trazodone    Controlled type 2 diabetes mellitus without complication, without long-term current use of insulin (HCC) Hold oral hypoglycemic agents Maintain consistent carbohydrate diet Glycemic control with sliding scale insulin    Hypertension Continue amlodipine        Subjective: Patient is seen and examined at the bedside.  Status post bone marrow biopsy 11/26  Physical Exam: Vitals:   03/25/23 1100 03/25/23 1115 03/25/23 1130 03/25/23 1158  BP: (!) 117/54 (!) 124/56 (!) 118/58 132/60  Pulse: 86 83 80 85  Resp: 19 (!) 21 16   Temp:    98.7 F (37.1 C)  TempSrc:      SpO2: 94% 92% 94% 98%   Vitals and nursing note reviewed.  Constitutional:      Appearance: Normal appearance. He is obese.  HENT:     Head: Normocephalic.     Nose: Nose normal.  Mouth/Throat:     Mouth: Mucous membranes are moist.  Eyes:     Comments: Pale conjunctiva  Cardiovascular:     Rate and Rhythm: Normal rate and regular rhythm.  Pulmonary:     Effort: Pulmonary effort is normal.     Breath sounds: Normal breath sounds.  Abdominal:     General: Abdomen is flat. Bowel sounds are normal.     Palpations: Abdomen is soft.  Musculoskeletal:        General: Normal range of motion.      Cervical back: Normal range of motion and neck supple.  Skin:    Comments: Scattered petechia rash on extremities Neurological:     Mental Status: He is alert and oriented to person, place, and time.  Psychiatric:        Mood and Affect: Mood normal.        Behavior: Behavior normal.     Data Reviewed: Labs reviewed.  Platelet count 22,000 There are no new results to review at this time.  Family Communication: Plan of care discussed with patient and his partner at the bedside  Disposition: Status is: Inpatient Remains inpatient appropriate because: For renal biopsy in a.m.  Planned Discharge Destination: Home    Time spent: 35 minutes  Author: Lucile Shutters, MD 03/25/2023 2:29 PM  For on call review [email or web address].

## 2023-03-26 ENCOUNTER — Other Ambulatory Visit: Payer: Self-pay | Admitting: Oncology

## 2023-03-26 ENCOUNTER — Inpatient Hospital Stay: Payer: Medicare HMO

## 2023-03-26 DIAGNOSIS — D649 Anemia, unspecified: Secondary | ICD-10-CM | POA: Diagnosis not present

## 2023-03-26 DIAGNOSIS — D696 Thrombocytopenia, unspecified: Secondary | ICD-10-CM | POA: Diagnosis not present

## 2023-03-26 DIAGNOSIS — C799 Secondary malignant neoplasm of unspecified site: Secondary | ICD-10-CM | POA: Diagnosis not present

## 2023-03-26 LAB — CBC WITH DIFFERENTIAL/PLATELET
Abs Immature Granulocytes: 0.74 10*3/uL — ABNORMAL HIGH (ref 0.00–0.07)
Basophils Absolute: 0 10*3/uL (ref 0.0–0.1)
Basophils Relative: 0 %
Eosinophils Absolute: 0 10*3/uL (ref 0.0–0.5)
Eosinophils Relative: 0 %
HCT: 22.4 % — ABNORMAL LOW (ref 39.0–52.0)
Hemoglobin: 7.9 g/dL — ABNORMAL LOW (ref 13.0–17.0)
Immature Granulocytes: 11 %
Lymphocytes Relative: 21 %
Lymphs Abs: 1.4 10*3/uL (ref 0.7–4.0)
MCH: 30.5 pg (ref 26.0–34.0)
MCHC: 35.3 g/dL (ref 30.0–36.0)
MCV: 86.5 fL (ref 80.0–100.0)
Monocytes Absolute: 0.3 10*3/uL (ref 0.1–1.0)
Monocytes Relative: 4 %
Neutro Abs: 4.2 10*3/uL (ref 1.7–7.7)
Neutrophils Relative %: 64 %
Platelets: 73 10*3/uL — ABNORMAL LOW (ref 150–400)
RBC: 2.59 MIL/uL — ABNORMAL LOW (ref 4.22–5.81)
RDW: 14.4 % (ref 11.5–15.5)
Smear Review: NORMAL
WBC: 6.5 10*3/uL (ref 4.0–10.5)
nRBC: 1.8 % — ABNORMAL HIGH (ref 0.0–0.2)

## 2023-03-26 LAB — CBC
HCT: 23.4 % — ABNORMAL LOW (ref 39.0–52.0)
Hemoglobin: 8.1 g/dL — ABNORMAL LOW (ref 13.0–17.0)
MCH: 30.3 pg (ref 26.0–34.0)
MCHC: 34.6 g/dL (ref 30.0–36.0)
MCV: 87.6 fL (ref 80.0–100.0)
Platelets: 32 10*3/uL — ABNORMAL LOW (ref 150–400)
RBC: 2.67 MIL/uL — ABNORMAL LOW (ref 4.22–5.81)
RDW: 14.4 % (ref 11.5–15.5)
WBC: 4.7 10*3/uL (ref 4.0–10.5)
nRBC: 1.1 % — ABNORMAL HIGH (ref 0.0–0.2)

## 2023-03-26 LAB — PROTIME-INR
INR: 1.2 (ref 0.8–1.2)
Prothrombin Time: 15.6 s — ABNORMAL HIGH (ref 11.4–15.2)

## 2023-03-26 LAB — GLUCOSE, CAPILLARY
Glucose-Capillary: 190 mg/dL — ABNORMAL HIGH (ref 70–99)
Glucose-Capillary: 171 mg/dL — ABNORMAL HIGH (ref 70–99)
Glucose-Capillary: 166 mg/dL — ABNORMAL HIGH (ref 70–99)

## 2023-03-26 MED ORDER — DEXAMETHASONE 6 MG PO TABS
40.0000 mg | ORAL_TABLET | Freq: Every day | ORAL | Status: DC
  Administered 2023-03-26: 40 mg via ORAL
  Filled 2023-03-26 (×2): qty 1

## 2023-03-26 MED ORDER — DEXAMETHASONE 20 MG PO TABS: 40.0000 mg | ORAL_TABLET | Freq: Every day | ORAL | 0 refills | Status: AC

## 2023-03-26 MED ORDER — OXYCODONE-ACETAMINOPHEN 5-325 MG PO TABS: 1.0000 | ORAL_TABLET | ORAL | 0 refills | Status: DC | PRN

## 2023-03-26 MED ORDER — OXYCODONE-ACETAMINOPHEN 5-325 MG PO TABS: 1.0000 | ORAL_TABLET | ORAL | Status: DC | PRN

## 2023-03-26 MED ORDER — SODIUM CHLORIDE 0.9% IV SOLUTION: Freq: Once | INTRAVENOUS | Status: AC

## 2023-03-26 MED ORDER — ENSURE ENLIVE PO LIQD: 237.0000 mL | Freq: Two times a day (BID) | ORAL | Status: AC

## 2023-03-26 NOTE — TOC CM/SW Note (Signed)
TOC consulted for medication assistance.  Confirmed patient has medication insurance coverage through [employer].  Patient would not be eligible for medication assistance

## 2023-03-26 NOTE — Progress Notes (Signed)
Discharge instructions were reviewed with patient and family. Questions were encourage and answered. IV was removed. Belongings were collected by patient.

## 2023-03-26 NOTE — Progress Notes (Signed)
3rd unit of PLT started. VSS

## 2023-03-26 NOTE — Progress Notes (Signed)
2nd unit of PLT started at 1333. VSS

## 2023-03-26 NOTE — Progress Notes (Signed)
1st unit of PLT started at 1015. After 15 minutes rated was increased to 283ml/hr and patient tolerating it well.

## 2023-03-26 NOTE — Progress Notes (Signed)
Hematology/Oncology Progress note [phone] [phone]     Patient Care Team: Dorothey Baseman, MD as PCP - General (Family Medicine) Rickard Patience, MD as Consulting Physician (Oncology)   Name of the patient: Simao Saputo  [id number]  03-Aug-1955  Date of visit: 03/26/23   INTERVAL HISTORY-   Patient feels weak, wants to go home for Thanksgiving.  Appetite is not good. Pain is controlled.     Allergies  Allergen Reactions   Lisinopril Cough    Patient Active Problem List   Diagnosis Date Noted   Abnormal CT scan, kidney 02/26/2023    Priority: High   Unintentional weight loss 02/26/2023    Priority: Medium    Thrombocytopenia (HCC) 02/26/2023    Priority: Medium    Controlled type 2 diabetes mellitus without complication, without long-term current use of insulin (HCC) 03/24/2023   Metastasis (HCC) 03/24/2023   Normocytic anemia 03/24/2023   Nonrheumatic tricuspid valve regurgitation 02/20/2022   Nonrheumatic mitral valve regurgitation 02/20/2022   Obesity (BMI 30.0-34.9) 11/15/2021   Elevated PSA 05/30/2021   Ingrown toenail without infection 03/08/2021   Primary insomnia 11/30/2020   High risk medication use 11/30/2020   At risk for prolonged QT interval syndrome 11/30/2020   Severe obesity (BMI 35.0-39.9) with comorbidity (HCC) 03/16/2020   MDD (major depressive disorder), recurrent, in full remission (HCC) 08/06/2019   Prediabetes 06/03/2019   Major depressive disorder, recurrent, moderate (HCC) 05/28/2019   Lymphedema 05/20/2019   Chronic venous insufficiency 05/20/2019   Abnormal fasting glucose 03/18/2019   No-show for appointment 01/26/2019   MDD (major depressive disorder), recurrent, in partial remission (HCC) 12/01/2018   Social anxiety disorder 12/01/2018   Panic disorder 12/01/2018   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 12/01/2018   Lumbar spondylosis 04/02/2018   Lumbar degenerative disc disease 04/02/2018   Lumbar facet arthropathy  04/02/2018   Strain of lumbar region 04/02/2018   Anxiety, generalized 02/06/2017   Chronic bilateral low back pain 12/18/2016   Lumbar polyradiculopathy 12/18/2016   Polyneuropathy 12/18/2016   Pain in limb 07/03/2016   DJD (degenerative joint disease) 07/03/2016   Cellulitis 06/18/2016   Hypertension 10/26/2014   Lower extremity edema 10/26/2014   Hyperlipidemia 10/26/2014   Transient loss of consciousness 09/08/2014     Past Medical History:  Diagnosis Date   Abnormal liver function test    Anxiety    Anxiety    Arthritis    Chronic lower back pain    Degenerative joint disease    Depression    Diabetes mellitus without complication (HCC)    Fatty liver    Heart murmur    Hepatitis B    Hyperlipidemia    Hypertension    Lumbar polyradiculopathy    Panic attacks    Polyneuropathy    Vertigo 05/03/2017   ER     Past Surgical History:  Procedure Laterality Date   COLONOSCOPY     COLONOSCOPY WITH PROPOFOL N/A 10/01/2017   Procedure: COLONOSCOPY WITH PROPOFOL;  Surgeon: Toledo, Boykin Nearing, MD;  Location: [facility] ENDOSCOPY;  Service: Endoscopy;  Laterality: N/A;   IR BONE MARROW BIOPSY & ASPIRATION  03/25/2023   PROSTATE BIOPSY      Social History   Socioeconomic History   Marital status: Single    Spouse name: denise   Number of children: 0   Years of education: Not on file   Highest education level: 11th grade  Occupational History    Comment: disabled  Tobacco Use   Smoking status: Never  Smokeless tobacco: Never  Vaping Use   Vaping status: Never Used  Substance and Sexual Activity   Alcohol use: No   Drug use: Not Currently    Types: Marijuana   Sexual activity: Not Currently  Other Topics Concern   Not on file  Social History Narrative   Not on file   Social Determinants of Health   Financial Resource Strain: Patient Declined (11/15/2022)   Received from [facility]   Overall Financial Resource Strain (CARDIA)    Difficulty  of Paying Living Expenses: Patient declined  Food Insecurity: No Food Insecurity (03/25/2023)   Hunger Vital Sign    Worried About Running Out of Food in the Last Year: Never true    Ran Out of Food in the Last Year: Never true  Transportation Needs: No Transportation Needs (03/25/2023)   PRAPARE - Administrator, Civil Service (Medical): No    Lack of Transportation (Non-Medical): No  Physical Activity: Inactive (03/19/2017)   Exercise Vital Sign    Days of Exercise per Week: 0 days    Minutes of Exercise per Session: 0 min  Stress: Not on file  Social Connections: Moderately Isolated (03/19/2017)   Social Connection and Isolation Panel [NHANES]    Frequency of Communication with Friends and Family: Twice a week    Frequency of Social Gatherings with Friends and Family: Never    Attends Religious Services: Never    Database administrator or Organizations: No    Attends Banker Meetings: Never    Marital Status: Living with partner  Intimate Partner Violence: Not At Risk (03/25/2023)   Humiliation, Afraid, Rape, and Kick questionnaire    Fear of Current or Ex-Partner: No    Emotionally Abused: No    Physically Abused: No    Sexually Abused: No     Family History  Problem Relation Age of Onset   Cancer Mother        Pancreatic   Diabetes Mother    Hypertension Mother    Varicose Veins Mother    Alcohol abuse Mother    Pancreatic cancer Mother    Cancer Father        Jaw/ oral   Heart disease Father        CABG   Aortic aneurysm Father    Congestive Heart Failure Father    Hypertension Father    Varicose Veins Father    Alcohol abuse Father    Bipolar disorder Sister    Bipolar disorder Brother      Current Facility-Administered Medications:    0.9 %  sodium chloride infusion (Manually program via Guardrails IV Fluids), , Intravenous, Once, Esaw Grandchild A, DO   acetaminophen (TYLENOL) tablet 325-650 mg, 325-650 mg, Oral, Q6H PRN **OR**  acetaminophen (TYLENOL) suppository 325-650 mg, 325-650 mg, Rectal, Q6H PRN, Meegan, Eryn, RPH   amLODipine (NORVASC) tablet 5 mg, 5 mg, Oral, Daily, Agbata, Tochukwu, MD, 5 mg at 03/26/23 0825   clotrimazole-betamethasone (LOTRISONE) cream, , Topical, BID, Agbata, Tochukwu, MD   dexamethasone (DECADRON) tablet 40 mg, 40 mg, Oral, Daily, Rickard Patience, MD   feeding supplement (ENSURE ENLIVE / ENSURE PLUS) liquid 237 mL, 237 mL, Oral, BID BM, Agbata, Tochukwu, MD   hydrOXYzine (ATARAX) tablet 25 mg, 25 mg, Oral, Q8H PRN, Agbata, Tochukwu, MD   insulin aspart (novoLOG) injection 0-15 Units, 0-15 Units, Subcutaneous, TID WC, Agbata, Tochukwu, MD, 3 Units at 03/26/23 1240   lidocaine (LIDODERM) 5 % 1 patch,  1 patch, Transdermal, Q12H, Agbata, Tochukwu, MD, 1 patch at 03/26/23 0823   magnesium hydroxide (MILK OF MAGNESIA) suspension 5 mL, 5 mL, Oral, Daily PRN, Agbata, Tochukwu, MD   ondansetron (ZOFRAN) tablet 4 mg, 4 mg, Oral, Q6H PRN **OR** ondansetron (ZOFRAN) injection 4 mg, 4 mg, Intravenous, Q6H PRN, Agbata, Tochukwu, MD   oxyCODONE-acetaminophen (PERCOCET/ROXICET) 5-325 MG per tablet 2 tablet, 2 tablet, Oral, Q6H PRN, Agbata, Tochukwu, MD   polyethylene glycol (MIRALAX / GLYCOLAX) packet 17 g, 17 g, Oral, Daily, Agbata, Tochukwu, MD, 17 g at 03/26/23 0827   pravastatin (PRAVACHOL) tablet 20 mg, 20 mg, Oral, Daily, Agbata, Tochukwu, MD, 20 mg at 03/26/23 0825   sodium chloride flush (NS) 0.9 % injection 3 mL, 3 mL, Intravenous, Q12H, Agbata, Tochukwu, MD, 3 mL at 03/26/23 0828   sodium chloride flush (NS) 0.9 % injection 3 mL, 3 mL, Intravenous, PRN, Agbata, Tochukwu, MD   tamsulosin (FLOMAX) capsule 0.4 mg, 0.4 mg, Oral, QPC supper, Agbata, Tochukwu, MD, 0.4 mg at 03/25/23 1656   traZODone (DESYREL) tablet 50 mg, 50 mg, Oral, QHS PRN, Agbata, Tochukwu, MD, 50 mg at 03/25/23 2214   Physical exam:  Vitals:   03/26/23 0822 03/26/23 1015 03/26/23 1030 03/26/23 1215  BP: (!) 145/78 129/67 125/60  137/64  Pulse: 78 74 75 74  Resp: 18 16    Temp: (!) 97.5 F (36.4 C) 97.7 F (36.5 C) 98 F (36.7 C) 98 F (36.7 C)  TempSrc: Oral Oral Oral Oral  SpO2: 97% 98% 97% 98%  Weight:      Height:       Physical Exam Constitutional:      General: He is not in acute distress.    Appearance: He is not diaphoretic.  HENT:     Head: Normocephalic and atraumatic.  Eyes:     General: No scleral icterus. Cardiovascular:     Rate and Rhythm: Normal rate.  Pulmonary:     Effort: Pulmonary effort is normal. No respiratory distress.  Abdominal:     General: There is no distension.     Palpations: Abdomen is soft.     Tenderness: There is no abdominal tenderness.  Musculoskeletal:        General: Normal range of motion.     Cervical back: Normal range of motion and neck supple.  Skin:    Findings: No erythema.     Comments: Petechia on bilateral lower extremities.   Neurological:     Mental Status: He is alert and oriented to person, place, and time. Mental status is at baseline.     Cranial Nerves: No cranial nerve deficit.     Motor: No abnormal muscle tone.  Psychiatric:        Mood and Affect: Affect normal.       Labs    Latest Ref Rng & Units 03/26/2023    5:05 AM 03/25/2023    6:15 PM 03/25/2023    8:46 AM  CBC  WBC 4.0 - 10.5 K/uL 4.7  4.0  4.7   Hemoglobin 13.0 - 17.0 g/dL 8.1  8.1  8.1   Hematocrit 39.0 - 52.0 % 23.4  23.1  23.5   Platelets 150 - 400 K/uL 32  32  22       Latest Ref Rng & Units 03/25/2023    5:42 AM 03/24/2023   11:54 AM 03/24/2023   10:04 AM  CMP  Glucose 70 - 99 mg/dL 161  096  045   BUN  8 - 23 mg/dL 18  21  22    Creatinine 0.61 - 1.24 mg/dL 8.29  5.62  1.30   Sodium 135 - 145 mmol/L 133  129  130   Potassium 3.5 - 5.1 mmol/L 4.2  4.2  4.2   Chloride 98 - 111 mmol/L 98  94  95   CO2 22 - 32 mmol/L 26  27  24    Calcium 8.9 - 10.3 mg/dL 8.3  8.6  8.5   Total Protein 6.5 - 8.1 g/dL  7.2  6.9   Total Bilirubin <1.2 mg/dL  0.6  0.6    Alkaline Phos 38 - 126 U/L  105  104   AST 15 - 41 U/L  33  33   ALT 0 - 44 U/L  16  16      RADIOGRAPHIC STUDIES: I have personally reviewed the radiological images as listed and agreed with the findings in the report. IR BONE MARROW BIOPSY & ASPIRATION  Result Date: 03/25/2023 INDICATION: 67 year old male referred for bone marrow biopsy EXAM: IR BONE MARRO BIOPY AND ASPIRATION MEDICATIONS: None. ANESTHESIA/SEDATION: Moderate (conscious) sedation was employed during this procedure. A total of Versed 1.0 mg and Fentanyl 50 mcg was administered intravenously. Moderate Sedation Time: 10 minutes. The patient's level of consciousness and vital signs were monitored continuously by radiology nursing throughout the procedure under my direct supervision. FLUOROSCOPY TIME:  Fluoroscopy Time:   (16 mGy). COMPLICATIONS: None PROCEDURE: Informed written consent was obtained from the patient after a thorough discussion of the procedural risks, benefits and alternatives. All questions were addressed. Maximal Sterile Barrier Technique was utilized including caps, mask, sterile gowns, sterile gloves, sterile drape, hand hygiene and skin antiseptic. A timeout was performed prior to the initiation of the procedure. The procedure risks, benefits, and alternatives were explained to the patient. Questions regarding the procedure were encouraged and answered. The patient understands and consents to the procedure. Patient was positioned prone under the image intensifier. Physical exam was used to determine the L5- S1 level, and then the posterior pelvis was prepped with Chlorhexidine in a sterile fashion. A sterile drape was applied covering the operative field. Sterile gown/sterile gloves were used for the procedure. Local anesthesia was provided with 1% Lidocaine. Posterior right iliac bone was targeted for biopsy. The skin and subcutaneous tissues were infiltrated with 1% lidocaine without epinephrine. A small stab  incision was made with an 11 blade scalpel, and an 11 gauge [name] needle was advanced with fluoro guidance to the posterior cortex. Manual forced was used to advance the needle through the posterior cortex and the stylet was removed. A bone marrow aspirate was retrieved and passed to a cytotechnologist in the room. The [name] needle was then advanced without the stylet for a core biopsy. The core biopsy was retrieved and also passed to a cytotechnologist. Manual pressure was used for hemostasis and a sterile dressing was placed. No complications were encountered no significant blood loss was encountered. Patient tolerated the procedure well and remained hemodynamically stable throughout. IMPRESSION: Status post image guided bone marrow biopsy. Signed, Yvone Neu. Miachel Roux, RPVI [facility] Electronically Signed   By: Gilmer Mor D.O.   On: 03/25/2023 10:09   NM PET Image Initial (PI) Skull Base To Thigh  Result Date: 03/10/2023 CLINICAL DATA:  Initial treatment strategy for renal lesions and abnormal blood smear. EXAM: NUCLEAR MEDICINE PET SKULL BASE TO THIGH TECHNIQUE: 12.69 mCi F-18 FDG was injected intravenously. Full-ring  PET imaging was performed from the skull base to thigh after the radiotracer. CT data was obtained and used for attenuation correction and anatomic localization. Fasting blood glucose: 173 mg/dl COMPARISON:  Prior recent CT and MRI examinations. FINDINGS: Mediastinal blood pool activity: SUV max 2.06 Liver activity: SUV max 4.82 NECK: Single small hypermetabolic focus associated with the left submandibular gland. Possible muscle lesion or adjacent lymph node. No other significant findings in the neck. Incidental CT findings: None. CHEST: Lytic destructive hypermetabolic bone lesion involving the anterior aspect of the first rib with an associated soft tissue component. SUV max is 9.0. No enlarged or hypermetabolic  mediastinal or hilar lymph nodes. No hypermetabolic axillary or supraclavicular nodes. Small hypermetabolic subpleural nodular density in the superior segment of the right lower lobe. SUV max is 4.22. Possible pleural lymph node. A few other small scattered pulmonary nodules are noted on the CT scan. Incidental CT findings: Incidental aberrant right subclavian artery. Scattered coronary artery calcifications. Calcifications noted around the aortic valve. ABDOMEN/PELVIS: No hypermetabolic hepatic or splenic lesions. The spleen is borderline enlarged and demonstrates SUV max of 4.91, slightly greater than that of the liver. Difficult to identify any definite renal lesions due to the markedly hypermetabolic collecting systems but they do appear to be somewhat heterogeneous. Numerous small mesenteric lymph nodes as seen on the prior CT scan. These demonstrate hypermetabolism. Index node on image 105/6 measures 14 mm and has an SUV max of 6.05. 9 mm node on image 29/6 has an SUV max of 6.32 No retroperitoneal or pelvic lymphadenopathy. No inguinal lymphadenopathy. There Incidental CT findings: Scattered aortic and iliac artery calcifications. Small bilateral renal calculi. Enlarged prostate gland with median lobe hypertrophy impressing on the base of the bladder. SKELETON: Diffuse hypermetabolic bone disease without obvious CT correlate except involving the right first anterior rib which appears partially destroyed with an adjacent soft tissue mass. Left sacral disease has an SUV max of 6.41. Left-sided L5 lesion has an SUV max of 7.6. T4 lesion has an SUV max of 7.03. Right femoral head lesion has an SUV max of 6.9. Incidental CT findings: None. IMPRESSION: 1. Diffuse hypermetabolic bone disease without obvious CT correlate except involving the right first anterior rib which appears partially destroyed with an adjacent soft tissue mass. Findings consistent with diffuse osseous lymphoma. 2. Numerous small hypermetabolic  mesenteric lymph nodes. 3. 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 4. Borderline enlarged spleen with mild hypermetabolism. 5. Small hypermetabolic subpleural nodular density in the superior segment of the right lower lobe. Possible pleural lymph node. 6. Small hypermetabolic focus associated with the left submandibular gland. Possible muscle lesion or adjacent lymph node. 7. Aortic atherosclerosis. Aortic Atherosclerosis (ICD10-I70.0). Electronically Signed   By: Rudie Meyer M.D.   On: 03/10/2023 12:43   CT ABDOMEN PELVIS W CONTRAST  Result Date: 03/07/2023 CLINICAL DATA:  Acute nonlocalized abdominal pain, back pain EXAM: CT ABDOMEN AND PELVIS WITH CONTRAST TECHNIQUE: Multidetector CT imaging of the abdomen and pelvis was performed using the standard protocol following bolus administration of intravenous contrast. RADIATION DOSE REDUCTION: This exam was performed according to the departmental dose-optimization program which includes automated exposure control, adjustment of the mA and/or kV according to patient size and/or use of iterative reconstruction technique. CONTRAST:  OMNIPAQUE IOHEXOL 350 MG/ML SOLN COMPARISON:  MRI 02/28/2023, CT 02/23/2023 FINDINGS: Lower chest: No  acute abnormality. Hepatobiliary: No focal liver abnormality is seen. No gallstones, gallbladder wall thickening, or biliary dilatation. Pancreas: Unremarkable Spleen: Normal in size without focal abnormality. Adrenals/Urinary Tract: The adrenal glands are unremarkable. The kidneys are normal in size and position. There is heterogeneous cortical enhancement bilaterally, identical to prior examinations of 02/23/2023 and 02/17/2023 with areas of corresponding restricted diffusion and hypoenhancement noted on a prior MRI examination suspicious for lymphomatous or leukemic  infiltration. No hydronephrosis. No perinephric inflammatory stranding or fluid collections are seen. No intrarenal or ureteral calculi are identified. The bladder is unremarkable. Stomach/Bowel: Stomach, small bowel, and large bowel are unremarkable. Appendix normal. No free intraperitoneal gas or fluid. Vascular/Lymphatic: Shotty adenopathy is again seen within the mesenteric root measuring up to 10 mm in short axis diameter. No additional pathologic adenopathy within the abdomen and pelvis. Mild aortoiliac atherosclerotic calcification. No aortic aneurysm. Reproductive: Marked prostatic hypertrophy. Other: No abdominal wall hernia Musculoskeletal: 6 no acute bone abnormality. No lytic or blastic bone lesion. IMPRESSION: 1. No acute intra-abdominal pathology identified. No definite radiographic explanation for the patient's reported symptoms. 2. Heterogeneous cortical enhancement bilaterally, identical to prior examinations of 02/23/2023 and 02/17/2023 with areas of corresponding restricted diffusion and hypoenhancement noted on a prior MRI examination suspicious for lymphomatous or leukemic infiltration. PET CT examination or percutaneous needle biopsy may be helpful for further evaluation 3. Shotty adenopathy within the mesenteric root, unchanged. 4. Marked prostatic hypertrophy. Aortic Atherosclerosis (ICD10-I70.0). Electronically Signed   By: Helyn Numbers M.D.   On: 03/07/2023 02:17   MR Abdomen W Wo Contrast  Result Date: 02/28/2023 CLINICAL DATA:  Abnormal appearance of the kidneys on prior CT EXAM: MRI ABDOMEN WITHOUT AND WITH CONTRAST TECHNIQUE: Multiplanar multisequence MR imaging of the abdomen was performed both before and after the administration of intravenous contrast. CONTRAST:  10mL GADAVIST GADOBUTROL 1 MMOL/ML IV SOLN COMPARISON:  CT abdomen pelvis, 02/23/2023 FINDINGS: Lower chest: No acute abnormality. Hepatobiliary: No solid liver abnormality is seen. Hepatic steatosis. No gallstones,  gallbladder wall thickening, or biliary dilatation. Pancreas: Unremarkable. No pancreatic ductal dilatation or surrounding inflammatory changes. Spleen: Normal in size without significant abnormality. Adrenals/Urinary Tract: Adrenal glands are unremarkable. Simple, benign cyst of the superior pole of the left kidney, for which no specific further follow-up or characterization is required. Unusual, extensively heterogeneous appearance of the renal parenchyma best appreciated on diffusion-weighted sequences (series 8, image 109), and to a lesser extent early arterial phases of contrast enhancement (series 14, image 72). This is otherwise very difficult to appreciate. No obvious calculi, individual discrete lesion, or hydronephrosis. Stomach/Bowel: Stomach is within normal limits. No evidence of bowel wall thickening, distention, or inflammatory changes. Vascular/Lymphatic: Aortic atherosclerosis. No enlarged abdominal or pelvic lymph nodes. Other: No abdominal wall hernia or abnormality. No ascites. Musculoskeletal: Diffusely heterogeneous enhancement of the vertebral bone marrow (series 20, image 23). IMPRESSION: 1. Unusual, extensively heterogeneous appearance of the renal parenchyma best appreciated on diffusion-weighted sequences, and to a lesser extent early arterial phases of contrast enhancement. This appearance otherwise very difficult to appreciate on remaining sequences. No individual discrete lesion. This is of uncertain significance, and as previously reported and discussed, differential considerations would specifically include infiltrative processes such as renal lymphomatous involvement or metastatic disease. Multifocal pyelonephritis could also have this appearance if there are clinically referable signs and symptoms and urinalysis evidence. 2. Diffusely heterogeneous enhancement of the vertebral bone marrow, which is worrisome for osseous metastatic disease, particularly in light of findings described  above. 3. Hepatic steatosis. These  results will be called to the ordering clinician or representative by the Radiologist Assistant, and communication documented in the PACS or [employer]. Aortic Atherosclerosis (ICD10-I70.0). Electronically Signed   By: Jearld Lesch M.D.   On: 02/28/2023 13:43    Assessment and plan-   #Abnormal CT/MRI/PET scan-possible infiltrative kidney lesions. Plan was to obtain CT-guided renal lesion biopsy tomorrow if platelet count is above 50,000 Risks of kidney hematoma was discussed with patient.   # Anemia and thrombocytopenia, progressively worse.  Normal immature platelet fraction, indicating decreased marrow production. Recent blood work showed a normal B12 and folate. peripheral blood flowcytometry showed 2 % B lymphoblasts elevated LDH. Suspect lymphoma involvement in the marrow. S/p bone marrow biopsy,. Today platelet count is 32,000, he is getting additional platelet currently with repeat count after transfusion. If above 50,000, hopefully he will get kidney biopsy.  If not able to get biopsy,   given that he prefers to go home, I think it is reasonable to discharge. I recommend Dexamethasone 40mg  daily for 4 days. First dose today inpatient. Hopefully this helps to alleviate symptoms. ER triggers discussed with patient.  He will follow up outpatient with me next week to go over result.   Discussed plan with Dr. Denton Lank  Thank you for allowing me to participate in the care of this patient.   Rickard Patience, MD, PhD Hematology Oncology 03/26/2023

## 2023-03-26 NOTE — Discharge Summary (Signed)
Physician Discharge Summary   Patient: Kyle Hood MRN: [id number] DOB: [birth year]  Admit date:     03/24/2023  Discharge date: 03/26/2023  Discharge Physician: Pennie Banter   PCP: Dorothey Baseman, MD   Recommendations at discharge:   Follow up with Oncology, Dr. Cathie Hoops, next week Follow up on bone marrow biopsy results Repeat CBC within 1 week - follow thrombocytopenia closely   Discharge Diagnoses: Principal Problem:   Thrombocytopenia (HCC) Active Problems:   Metastasis (HCC)   Obesity (BMI 30.0-34.9)   MDD (major depressive disorder), recurrent, in partial remission (HCC)   Hypertension   Controlled type 2 diabetes mellitus without complication, without long-term current use of insulin (HCC)   Normocytic anemia  Resolved Problems:   * No resolved hospital problems. *  Hospital Course: "[name] is a 67 y.o. 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 Patient notes that he has had worsening low back pain associated with night sweats over the last several months and is being seen by oncology.  Patient is currently being worked up by oncology and is scheduled for a bone marrow and renal biopsy in a.m. He had lab work done 1 day prior to the biopsy and was found to have a platelet count of 9.  He denies having any rectal bleeding, no hematuria but states that he had hemoptysis 1 week prior to presenting to the ER.  He complains of a poor appetite, a 10 to 15 pound weight loss over the last several weeks and low back pain with occasional radiation to his hips He denies feeling dizzy or lightheaded, he denies having any headache, no abdominal pain, no changes in his bowel habits, no blurred vision, no cough, no fever, no  chills, no focal deficit. Abnormal labs include a hemoglobin of 8.2, platelet count of 7, sodium of 129 2 bags of platelets were ordered in the ER and patient is currently being transfused"   Further hospital course and management as outlined below.   11/27 -- pt seen this AM, requesting to go home today.  Case discussed with Dr. Cathie Hoops and interventional radiology.  By the time platelet transfusions were completed, too late in the day for renal biopsy as planned.  Per Dr. Cathie Hoops, this can be pursued as outpatient and she will see patient in close follow up next week.  Pt cleared and stable for discharge home today.    Assessment and Plan:  * Thrombocytopenia [facility]) Appreciate hematology/oncology input Per hematology patient noted to have normal immature platelet fraction, indicating decreased marrow production.  Peripheral blood flow cytometry showed 2 % B lymphoblasts elevated LDH. Suspect lymphoma involvement in the marrow. No evidence of bleeding Transfused 2 bags of platelets on 11/25 with marginal increase in platelet count 7K >> 22K Bone marrow biopsy 11/26 - follow results Transfused 2 more bags of platelets on 11/26 - platelets 22 >> 32k Transfused 3 bags platelets today Follow up CBC after transfusions complete -- platelets 73k  Follow up with Dr. Cathie Hoops next week       Metastasis [facility]) Concern for possible metastatic disease MRI abdomen confirmed unusual, extensively heterogeneous appearance of the renal parenchyma, concerning for  infiltrative processes such as renal lymphomatous involvement or metastatic disease. Renal biopsy was not able to be done this admission. Close follow  up with Dr. Cathie Hoops Renal biopsy may be pursued as outpatient      Obesity (BMI 30.0-34.9) BMI 34 Complicates overall prognosis and care Lifestyle modification and exercise has been discussed with patient in detail     MDD (major depressive disorder), recurrent, in partial remission (HCC) Continue  trazodone     Controlled type 2 diabetes mellitus without complication, without long-term current use of insulin (HCC) Hold oral hypoglycemic agents Maintain consistent carbohydrate diet Glycemic control with sliding scale insulin     Hypertension Continue amlodipine         Consultants: Oncology, IR Procedures performed: As above  Disposition: Home Diet recommendation:  Discharge Diet Orders (From admission, onward)     Start     Ordered   03/26/23 0000  Diet - low sodium heart healthy        03/26/23 1705            DISCHARGE MEDICATION: Allergies as of 03/26/2023       Reactions   Lisinopril Cough        Medication List     STOP taking these medications    cyclobenzaprine 5 MG tablet Commonly known as: FLEXERIL   hydrochlorothiazide 25 MG tablet Commonly known as: HYDRODIURIL   morphine 15 MG 12 hr tablet Commonly known as: MS CONTIN   spironolactone 25 MG tablet Commonly known as: ALDACTONE   valsartan 80 MG tablet Commonly known as: DIOVAN       TAKE these medications    amLODipine 5 MG tablet Commonly known as: NORVASC Take 5 mg by mouth daily.   clotrimazole-betamethasone cream Commonly known as: LOTRISONE Apply topically 2 (two) times daily.   dexAMETHasone 20 MG Tabs Take 40 mg by mouth daily for 3 days. Start taking on: March 27, 2023   feeding supplement Liqd Take 237 mLs by mouth 2 (two) times daily between meals. Start taking on: March 27, 2023   FreeStyle Libre 3 Sensor Misc USE 1 SENSOR EVERY 14 DAYS   glipiZIDE 5 MG 24 hr tablet Commonly known as: GLUCOTROL XL Take 5 mg by mouth daily with breakfast.   hydrOXYzine 25 MG tablet Commonly known as: ATARAX Take 1 tablet (25 mg total) by mouth every 8 (eight) hours as needed for anxiety.   lidocaine 5 % Commonly known as: Lidoderm Place 1 patch onto the skin every 12 (twelve) hours. Remove & Discard patch within 12 hours or as directed by MD   magnesium  hydroxide 400 MG/5ML suspension Commonly known as: MILK OF MAGNESIA Take 5 mLs by mouth daily as needed for mild constipation.   naproxen 500 MG tablet Commonly known as: NAPROSYN TK 1 T PO BID WC   oxyCODONE-acetaminophen 5-325 MG tablet Commonly known as: Percocet Take 1-2 tablets by mouth every 4 (four) hours as needed for severe pain (pain score 7-10).   polyethylene glycol 17 g packet Commonly known as: MIRALAX / GLYCOLAX Take 17 g by mouth daily.   pravastatin 20 MG tablet Commonly known as: PRAVACHOL Take 20 mg by mouth daily.   tamsulosin 0.4 MG Caps capsule Commonly known as: FLOMAX TAKE 1 CAPSULE(0.4 MG) BY MOUTH DAILY   Tradjenta 5 MG Tabs tablet Generic drug: linagliptin Take 5 mg by mouth daily.   traZODone 50 MG tablet Commonly known as: DESYREL Take 1 tablet (50 mg total) by mouth at bedtime as needed for sleep. Stop Quetiapine        Discharge Exam: Filed Weights   03/25/23 1158  Weight: 122.2 kg   General exam: awake, alert, no acute distress HEENT: atraumatic, clear conjunctiva, anicteric sclera, moist mucus membranes, hearing grossly normal  Respiratory system: CTA, no wheezes, rales or rhonchi, normal respiratory effort. Cardiovascular system: normal S1/S2, RRR   Gastrointestinal system: soft, NT, ND, no HSM felt, +bowel sounds. Central nervous system: A&O x 4. no gross focal neurologic deficits, normal speech Extremities: moves all, no edema, normal tone Skin: dry, intact, normal temperature Psychiatry: normal mood, congruent affect, judgement and insight appear normal   Condition at discharge: stable  The results of significant diagnostics from this hospitalization (including imaging, microbiology, ancillary and laboratory) are listed below for reference.   Imaging Studies: IR BONE MARROW BIOPSY & ASPIRATION  Result Date: 03/25/2023 INDICATION: 67 year old male referred for bone marrow biopsy EXAM: IR BONE MARRO BIOPY AND ASPIRATION  MEDICATIONS: None. ANESTHESIA/SEDATION: Moderate (conscious) sedation was employed during this procedure. A total of Versed 1.0 mg and Fentanyl 50 mcg was administered intravenously. Moderate Sedation Time: 10 minutes. The patient's level of consciousness and vital signs were monitored continuously by radiology nursing throughout the procedure under my direct supervision. FLUOROSCOPY TIME:  Fluoroscopy Time:   (16 mGy). COMPLICATIONS: None PROCEDURE: Informed written consent was obtained from the patient after a thorough discussion of the procedural risks, benefits and alternatives. All questions were addressed. Maximal Sterile Barrier Technique was utilized including caps, mask, sterile gowns, sterile gloves, sterile drape, hand hygiene and skin antiseptic. A timeout was performed prior to the initiation of the procedure. The procedure risks, benefits, and alternatives were explained to the patient. Questions regarding the procedure were encouraged and answered. The patient understands and consents to the procedure. Patient was positioned prone under the image intensifier. Physical exam was used to determine the L5- S1 level, and then the posterior pelvis was prepped with Chlorhexidine in a sterile fashion. A sterile drape was applied covering the operative field. Sterile gown/sterile gloves were used for the procedure. Local anesthesia was provided with 1% Lidocaine. Posterior right iliac bone was targeted for biopsy. The skin and subcutaneous tissues were infiltrated with 1% lidocaine without epinephrine. A small stab incision was made with an 11 blade scalpel, and an 11 gauge [name] needle was advanced with fluoro guidance to the posterior cortex. Manual forced was used to advance the needle through the posterior cortex and the stylet was removed. A bone marrow aspirate was retrieved and passed to a cytotechnologist in the room. The [name] needle was then advanced without the stylet for a core biopsy. The core  biopsy was retrieved and also passed to a cytotechnologist. Manual pressure was used for hemostasis and a sterile dressing was placed. No complications were encountered no significant blood loss was encountered. Patient tolerated the procedure well and remained hemodynamically stable throughout. IMPRESSION: Status post image guided bone marrow biopsy. Signed, Yvone Neu. Miachel Roux, RPVI [facility] Radiology Electronically Signed   By: Gilmer Mor D.O.   On: 03/25/2023 10:09   NM PET Image Initial (PI) Skull Base To Thigh  Result Date: 03/10/2023 CLINICAL DATA:  Initial treatment strategy for renal lesions and abnormal blood smear. EXAM: NUCLEAR MEDICINE PET SKULL BASE TO THIGH TECHNIQUE: 12.69 mCi F-18 FDG was injected intravenously. Full-ring PET imaging was performed from the skull base to thigh after the radiotracer. CT data was obtained and used for attenuation correction and anatomic localization. Fasting blood glucose: 173 mg/dl COMPARISON:  Prior recent CT and MRI examinations. FINDINGS: Mediastinal blood pool activity:  SUV max 2.06 Liver activity: SUV max 4.82 NECK: Single small hypermetabolic focus associated with the left submandibular gland. Possible muscle lesion or adjacent lymph node. No other significant findings in the neck. Incidental CT findings: None. CHEST: Lytic destructive hypermetabolic bone lesion involving the anterior aspect of the first rib with an associated soft tissue component. SUV max is 9.0. No enlarged or hypermetabolic mediastinal or hilar lymph nodes. No hypermetabolic axillary or supraclavicular nodes. Small hypermetabolic subpleural nodular density in the superior segment of the right lower lobe. SUV max is 4.22. Possible pleural lymph node. A few other small scattered pulmonary nodules are noted on the CT scan. Incidental CT findings: Incidental aberrant right subclavian artery. Scattered coronary artery  calcifications. Calcifications noted around the aortic valve. ABDOMEN/PELVIS: No hypermetabolic hepatic or splenic lesions. The spleen is borderline enlarged and demonstrates SUV max of 4.91, slightly greater than that of the liver. Difficult to identify any definite renal lesions due to the markedly hypermetabolic collecting systems but they do appear to be somewhat heterogeneous. Numerous small mesenteric lymph nodes as seen on the prior CT scan. These demonstrate hypermetabolism. Index node on image 105/6 measures 14 mm and has an SUV max of 6.05. 9 mm node on image 29/6 has an SUV max of 6.32 No retroperitoneal or pelvic lymphadenopathy. No inguinal lymphadenopathy. There Incidental CT findings: Scattered aortic and iliac artery calcifications. Small bilateral renal calculi. Enlarged prostate gland with median lobe hypertrophy impressing on the base of the bladder. SKELETON: Diffuse hypermetabolic bone disease without obvious CT correlate except involving the right first anterior rib which appears partially destroyed with an adjacent soft tissue mass. Left sacral disease has an SUV max of 6.41. Left-sided L5 lesion has an SUV max of 7.6. T4 lesion has an SUV max of 7.03. Right femoral head lesion has an SUV max of 6.9. Incidental CT findings: None. IMPRESSION: 1. Diffuse hypermetabolic bone disease without obvious CT correlate except involving the right first anterior rib which appears partially destroyed with an adjacent soft tissue mass. Findings consistent with diffuse osseous lymphoma. 2. Numerous small hypermetabolic mesenteric lymph nodes. 3. 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 4. Borderline enlarged spleen with mild hypermetabolism. 5. Small hypermetabolic subpleural nodular density in the superior segment of the right lower lobe. Possible pleural  lymph node. 6. Small hypermetabolic focus associated with the left submandibular gland. Possible muscle lesion or adjacent lymph node. 7. Aortic atherosclerosis. Aortic Atherosclerosis (ICD10-I70.0). Electronically Signed   By: Rudie Meyer M.D.   On: 03/10/2023 12:43   CT ABDOMEN PELVIS W CONTRAST  Result Date: 03/07/2023 CLINICAL DATA:  Acute nonlocalized abdominal pain, back pain EXAM: CT ABDOMEN AND PELVIS WITH CONTRAST TECHNIQUE: Multidetector CT imaging of the abdomen and pelvis was performed using the standard protocol following bolus administration of intravenous contrast. RADIATION DOSE REDUCTION: This exam was performed according to the departmental dose-optimization program which includes automated exposure control, adjustment of the mA and/or kV according to patient size and/or use of iterative reconstruction technique. CONTRAST:  OMNIPAQUE IOHEXOL 350 MG/ML SOLN COMPARISON:  MRI 02/28/2023, CT 02/23/2023 FINDINGS: Lower chest: No acute abnormality. Hepatobiliary: No focal liver abnormality is seen. No gallstones, gallbladder wall thickening, or biliary dilatation. Pancreas: Unremarkable Spleen: Normal in size without focal abnormality. Adrenals/Urinary Tract: The adrenal glands are unremarkable. The kidneys are normal in size and position. There is  heterogeneous cortical enhancement bilaterally, identical to prior examinations of 02/23/2023 and 02/17/2023 with areas of corresponding restricted diffusion and hypoenhancement noted on a prior MRI examination suspicious for lymphomatous or leukemic infiltration. No hydronephrosis. No perinephric inflammatory stranding or fluid collections are seen. No intrarenal or ureteral calculi are identified. The bladder is unremarkable. Stomach/Bowel: Stomach, small bowel, and large bowel are unremarkable. Appendix normal. No free intraperitoneal gas or fluid. Vascular/Lymphatic: Shotty adenopathy is again seen within the mesenteric root measuring up to 10  mm in short axis diameter. No additional pathologic adenopathy within the abdomen and pelvis. Mild aortoiliac atherosclerotic calcification. No aortic aneurysm. Reproductive: Marked prostatic hypertrophy. Other: No abdominal wall hernia Musculoskeletal: 6 no acute bone abnormality. No lytic or blastic bone lesion. IMPRESSION: 1. No acute intra-abdominal pathology identified. No definite radiographic explanation for the patient's reported symptoms. 2. Heterogeneous cortical enhancement bilaterally, identical to prior examinations of 02/23/2023 and 02/17/2023 with areas of corresponding restricted diffusion and hypoenhancement noted on a prior MRI examination suspicious for lymphomatous or leukemic infiltration. PET CT examination or percutaneous needle biopsy may be helpful for further evaluation 3. Shotty adenopathy within the mesenteric root, unchanged. 4. Marked prostatic hypertrophy. Aortic Atherosclerosis (ICD10-I70.0). Electronically Signed   By: Helyn Numbers M.D.   On: 03/07/2023 02:17   MR Abdomen W Wo Contrast  Result Date: 02/28/2023 CLINICAL DATA:  Abnormal appearance of the kidneys on prior CT EXAM: MRI ABDOMEN WITHOUT AND WITH CONTRAST TECHNIQUE: Multiplanar multisequence MR imaging of the abdomen was performed both before and after the administration of intravenous contrast. CONTRAST:  10mL GADAVIST GADOBUTROL 1 MMOL/ML IV SOLN COMPARISON:  CT abdomen pelvis, 02/23/2023 FINDINGS: Lower chest: No acute abnormality. Hepatobiliary: No solid liver abnormality is seen. Hepatic steatosis. No gallstones, gallbladder wall thickening, or biliary dilatation. Pancreas: Unremarkable. No pancreatic ductal dilatation or surrounding inflammatory changes. Spleen: Normal in size without significant abnormality. Adrenals/Urinary Tract: Adrenal glands are unremarkable. Simple, benign cyst of the superior pole of the left kidney, for which no specific further follow-up or characterization is required. Unusual,  extensively heterogeneous appearance of the renal parenchyma best appreciated on diffusion-weighted sequences (series 8, image 109), and to a lesser extent early arterial phases of contrast enhancement (series 14, image 72). This is otherwise very difficult to appreciate. No obvious calculi, individual discrete lesion, or hydronephrosis. Stomach/Bowel: Stomach is within normal limits. No evidence of bowel wall thickening, distention, or inflammatory changes. Vascular/Lymphatic: Aortic atherosclerosis. No enlarged abdominal or pelvic lymph nodes. Other: No abdominal wall hernia or abnormality. No ascites. Musculoskeletal: Diffusely heterogeneous enhancement of the vertebral bone marrow (series 20, image 23). IMPRESSION: 1. Unusual, extensively heterogeneous appearance of the renal parenchyma best appreciated on diffusion-weighted sequences, and to a lesser extent early arterial phases of contrast enhancement. This appearance otherwise very difficult to appreciate on remaining sequences. No individual discrete lesion. This is of uncertain significance, and as previously reported and discussed, differential considerations would specifically include infiltrative processes such as renal lymphomatous involvement or metastatic disease. Multifocal pyelonephritis could also have this appearance if there are clinically referable signs and symptoms and urinalysis evidence. 2. Diffusely heterogeneous enhancement of the vertebral bone marrow, which is worrisome for osseous metastatic disease, particularly in light of findings described above. 3. Hepatic steatosis. These results will be called to the ordering clinician or representative by the Radiologist Assistant, and communication documented in the PACS or [employer]. Aortic Atherosclerosis (ICD10-I70.0). Electronically Signed   By: Jearld Lesch M.D.   On: 02/28/2023 13:43    Microbiology:  Results for orders placed or performed during the hospital encounter of  02/17/23  Urine Culture     Status: None   Collection Time: 02/17/23 11:10 AM   Specimen: Urine, Clean Catch  Result Value Ref Range Status   Specimen Description   Final    URINE, CLEAN CATCH Performed at [facility], [address] [place], [place] [address]    Special Requests   Final    NONE Performed at [facility], [address], [place], [place] [address]    Culture   Final    NO GROWTH Performed at [facility], [address] [place], [place] [address]    Report Status 02/18/2023 FINAL  Final    Labs: CBC: Recent Labs  Lab 03/24/23 1004 03/24/23 1154 03/25/23 0005 03/25/23 0542 03/25/23 0846 03/25/23 1815 03/26/23 0505  WBC 3.8*   < > 3.7* 3.9* 4.7 4.0 4.7  NEUTROABS 1.9  --   --  1.6* 1.8  --   --   HGB 7.8*   < > 7.3* 7.8* 8.1* 8.1* 8.1*  HCT 23.5*   < > 21.9* 22.9* 23.5* 23.1* 23.4*  MCV 91.4   < > 92.0 90.9 89.0 89.5 87.6  PLT 8*   < > 21* 22* 22* 32* 32*   < > = values in this interval not displayed.   Basic Metabolic Panel: Recent Labs  Lab 03/24/23 1004 03/24/23 1154 03/25/23 0542  NA 130* 129* 133*  K 4.2 4.2 4.2  CL 95* 94* 98  CO2 24 27 26   GLUCOSE 191* 101* 118*  BUN 22 21 18   CREATININE 1.15 1.04 1.06  CALCIUM 8.5* 8.6* 8.3*   Liver Function Tests: Recent Labs  Lab 03/24/23 1004 03/24/23 1154  AST 33 33  ALT 16 16  ALKPHOS 104 105  BILITOT 0.6 0.6  PROT 6.9 7.2  ALBUMIN 3.2* 3.1*   CBG: Recent Labs  Lab 03/25/23 1607 03/25/23 2224 03/26/23 0821 03/26/23 1132 03/26/23 1617  GLUCAP 93 83 190* 171* 166*    Discharge time spent: greater than 30 minutes.  Signed: Pennie Banter, DO [facility] 03/26/2023

## 2023-03-27 LAB — PREPARE PLATELET PHERESIS
Unit division: 0
Unit division: 0
Unit division: 0
Unit division: 0

## 2023-03-27 LAB — BPAM PLATELET PHERESIS
Blood Product Expiration Date: 202411282359
Blood Product Expiration Date: 202411292359
Blood Product Expiration Date: 202411292359
Blood Product Expiration Date: 202411302359
ISSUE DATE / TIME: 202411260815
ISSUE DATE / TIME: 202411271005
ISSUE DATE / TIME: 202411271315
ISSUE DATE / TIME: 202411271553
Unit Type and Rh: 202411292359
Unit Type and Rh: 6200
Unit Type and Rh: 6200
Unit Type and Rh: 5100
Unit Type and Rh: 5100

## 2023-03-31 ENCOUNTER — Telehealth: Payer: Self-pay

## 2023-03-31 LAB — SURGICAL PATHOLOGY

## 2023-03-31 NOTE — Telephone Encounter (Signed)
PA approved by [facility] NCPDP 2017.  Auth expiration date: 04/29/23  Fax sent to pharmacy.

## 2023-03-31 NOTE — Telephone Encounter (Signed)
PA submitted via covermymeds portal  PA Case ID: # [id number] Rx: [id number]

## 2023-03-31 NOTE — Telephone Encounter (Signed)
-----   Message from Chriss Czar sent at 03/31/2023  8:18 AM EST ----- Regarding: PRIOR AUTH STARTED Rushie Chestnut PRIOR AUTH Romeo Rabon for Oxycodone sent to his chart.

## 2023-04-01 ENCOUNTER — Inpatient Hospital Stay: Payer: Medicare HMO | Attending: Oncology | Admitting: Oncology

## 2023-04-01 ENCOUNTER — Encounter: Payer: Self-pay | Admitting: Oncology

## 2023-04-01 ENCOUNTER — Inpatient Hospital Stay: Payer: Medicare HMO

## 2023-04-01 ENCOUNTER — Other Ambulatory Visit: Payer: Self-pay

## 2023-04-01 VITALS — BP 122/66 | HR 86 | Temp 98.9°F | Resp 18 | Wt 259.6 lb

## 2023-04-01 DIAGNOSIS — R634 Abnormal weight loss: Secondary | ICD-10-CM

## 2023-04-01 DIAGNOSIS — D696 Thrombocytopenia, unspecified: Secondary | ICD-10-CM

## 2023-04-01 DIAGNOSIS — C9100 Acute lymphoblastic leukemia not having achieved remission: Secondary | ICD-10-CM

## 2023-04-01 DIAGNOSIS — G893 Neoplasm related pain (acute) (chronic): Secondary | ICD-10-CM | POA: Diagnosis not present

## 2023-04-01 DIAGNOSIS — N289 Disorder of kidney and ureter, unspecified: Secondary | ICD-10-CM | POA: Diagnosis present

## 2023-04-01 LAB — CBC WITH DIFFERENTIAL (CANCER CENTER ONLY)
Abs Immature Granulocytes: 1.35 10*3/uL — ABNORMAL HIGH (ref 0.00–0.07)
Basophils Absolute: 0.1 10*3/uL (ref 0.0–0.1)
Basophils Relative: 1 %
Eosinophils Absolute: 0 10*3/uL (ref 0.0–0.5)
Eosinophils Relative: 1 %
HCT: 24.2 % — ABNORMAL LOW (ref 39.0–52.0)
Hemoglobin: 8 g/dL — ABNORMAL LOW (ref 13.0–17.0)
Immature Granulocytes: 20 %
Lymphocytes Relative: 32 %
Lymphs Abs: 2.1 10*3/uL (ref 0.7–4.0)
MCH: 30.3 pg (ref 26.0–34.0)
MCHC: 33.1 g/dL (ref 30.0–36.0)
MCV: 91.7 fL (ref 80.0–100.0)
Monocytes Absolute: 0.8 10*3/uL (ref 0.1–1.0)
Monocytes Relative: 12 %
Neutro Abs: 2.2 10*3/uL (ref 1.7–7.7)
Neutrophils Relative %: 34 %
Platelet Count: 14 10*3/uL — ABNORMAL LOW (ref 150–400)
RBC: 2.64 MIL/uL — ABNORMAL LOW (ref 4.22–5.81)
RDW: 15.8 % — ABNORMAL HIGH (ref 11.5–15.5)
Smear Review: NORMAL
WBC Count: 6.6 10*3/uL (ref 4.0–10.5)
nRBC: 11.2 % — ABNORMAL HIGH (ref 0.0–0.2)

## 2023-04-01 LAB — CMP (CANCER CENTER ONLY)
ALT: 18 U/L (ref 0–44)
AST: 28 U/L (ref 15–41)
Albumin: 3.4 g/dL — ABNORMAL LOW (ref 3.5–5.0)
Alkaline Phosphatase: 91 U/L (ref 38–126)
Anion gap: 9 (ref 5–15)
BUN: 22 mg/dL (ref 8–23)
CO2: 23 mmol/L (ref 22–32)
Calcium: 8.4 mg/dL — ABNORMAL LOW (ref 8.9–10.3)
Chloride: 98 mmol/L (ref 98–111)
Creatinine: 0.82 mg/dL (ref 0.61–1.24)
GFR, Estimated: >60 mL/min (ref >60)
Glucose, Bld: 97 mg/dL (ref 70–99)
Potassium: 4.7 mmol/L (ref 3.5–5.1)
Sodium: 130 mmol/L — ABNORMAL LOW (ref 135–145)
Total Bilirubin: 0.8 mg/dL (ref <1.2)
Total Protein: 6.6 g/dL (ref 6.5–8.1)

## 2023-04-01 MED ORDER — DEXAMETHASONE 4 MG PO TABS: 40.0000 mg | ORAL_TABLET | Freq: Every day | ORAL | 0 refills | Status: AC

## 2023-04-01 NOTE — Assessment & Plan Note (Signed)
Continue MS contin 15mg  BID.  Percocet 5/325 Q4-6 hour PRN

## 2023-04-01 NOTE — Progress Notes (Signed)
Pt here for follow up.

## 2023-04-01 NOTE — Assessment & Plan Note (Addendum)
CT scan findings were reviewed with patient. Infiltrative kidney lesions.differential includes lymphoma,  -peripheral smear showed immature cells. -LDH is elevated. - peripheral blood flowcytometry showed 2 % B lymphoblasts - MRI abdomen confirmed unusual, extensively heterogeneous appearance of the renal parenchyma, concerning for  infiltrative processes - PET showed Diffuse hypermetabolic bone disease, hypermetabolic mesenteric lymph nodes Borderline enlarged spleen with mild hypermetabolism. Difficult to identify any definite renal lesions   Bone marrow biopsy showed  Flow cytometric analysis identified an abnormal lymphoid population constituting 24% of the cells analyzed.  The cells are positive for CD10, CD19, CD38 and HLA-DR.  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  Immunohistochemically the cells are positive for PAX5, TdT, CD10 (subset) and CD20 (subset).  Flow cytometry reveals the neoplastic cells are positive for CD10, CD19, CD38 and HLA-DR.  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 Cytogenetics and Acute lymphoblastic leukemia FISH are pending.  Progressively worsen cytopenia, recommend patient to go to [facility] ER to be admitted for treatment of ALL.Marland Kitchen  Discussed case with [facility] oncology Dr. Vertell Limber.

## 2023-04-01 NOTE — Assessment & Plan Note (Signed)
Platelet count has progressively dropped to 14,000. No acute bleeding.  He will go to [facility] ER for further evaluation

## 2023-04-01 NOTE — Progress Notes (Addendum)
Hematology/Oncology Consult Note [phone] [phone]     REFERRING PROVIDER: Dorothey Baseman, MD    CHIEF COMPLAINTS/PURPOSE OF CONSULTATION:  Renal lesions, ALL  ASSESSMENT & PLAN:   Acute lymphoblastic leukemia (ALL) (HCC) CT scan findings were reviewed with patient. Infiltrative kidney lesions.differential includes lymphoma,  -peripheral smear showed immature cells. -LDH is elevated. - peripheral blood flowcytometry showed 2 % B lymphoblasts - MRI abdomen confirmed unusual, extensively heterogeneous appearance of the renal parenchyma, concerning for  infiltrative processes - PET showed Diffuse hypermetabolic bone disease, hypermetabolic mesenteric lymph nodes Borderline enlarged spleen with mild hypermetabolism. Difficult to identify any definite renal lesions   Bone marrow biopsy showed  Flow cytometric analysis identified an abnormal lymphoid population constituting 24% of the cells analyzed.  The cells are positive for CD10, CD19, CD38 and HLA-DR.  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  Immunohistochemically the cells are positive for PAX5, TdT, CD10 (subset) and CD20 (subset).  Flow cytometry reveals the neoplastic cells are positive for CD10, CD19, CD38 and HLA-DR.  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 Cytogenetics and Acute lymphoblastic leukemia FISH are pending.  Progressively worsen cytopenia, recommend patient to go to [facility] ER to be admitted for treatment of ALL.Marland Kitchen  Discussed case with [facility] oncology Dr. Vertell Limber.   Unintentional weight loss Due to ALL  Thrombocytopenia (HCC) Platelet count has progressively dropped to 14,000. No acute bleeding.  He will go to [facility] ER for further evaluation    Neoplasm related pain Continue MS contin 15mg  BID.   Percocet 5/325 Q4-6 hour PRN   No orders of the defined types were placed in this encounter.  All questions were answered. The patient knows to call the clinic with any problems, questions or concerns.  Rickard Patience, MD, PhD [facility] Hematology Oncology 04/01/2023    HISTORY OF PRESENTING ILLNESS:  Boy Fabry 67 y.o. adult presents to establish care for renal lesions.   The patient presented for evaluation of abnormal findings on two recent CT scans. Initially, the patient was treated for suspected pyelonephritis based on the first CT scan on 02/17/2023  1. Heterogeneous right renal enhancement with faint right perinephric edema, suspicious for pyelonephritis. There is slight heterogeneous left renal enhancement in the upper pole to a lesser extent. Recommend correlation with urinalysis. 2. Multiple prominent lymph nodes in the central small bowel mesentery, likely reactive, but nonspecific. Consider follow-up CT after a course of treatment to ensure resolution. 3. Hepatic steatosis. 4. Small hiatal hernia. 5. Enlarged prostate.  02/17/2023 Urine culture is negative.  02/23/2023 CT abdomen pelvis w contrast showed  1. No acute findings are noted in the abdomen or pelvis to account for the patient's symptoms. 2. However, there is a persistently abnormal appearance of the kidneys bilaterally, with multiple persistent hypovascular lesions, the appearance of which raises concern for potential metastatic disease to the kidneys or renal lymphoma. Further clinical evaluation is recommended. 3. Aortic atherosclerosis.    The patient reported experiencing acute right-sided abdominal and back pain, severe enough to warrant an ambulance call. The pain was described as radiating from the lower back and into the upper abdomen. Accompanying these symptoms were a feverish feeling and night sweats, which had been occurring daily for the past week. The patient also reported a loss of appetite,  leading to unintentional weight loss.  The patient has a  history of similar, albeit less severe, symptoms, including back pain and night sweats. Earlier in the year, the patient was treated for a kidney infection. The patient also reported changes in bowel movements, with recent episodes of constipation followed by soft, orange-colored stools.  The patient uses ibuprofen and acetaminophen for pain management. The patient denied any recreational drug use but did report using CBD products.  # Transgender male to male.   INTERVAL HISTORY Juron Petitte is a 67 y.o. adult who has above history reviewed by me today presents for follow up visit for  ALL.   02/28/23 MRI Abdomen w wo contrast  1. Unusual, extensively heterogeneous appearance of the renal parenchyma best appreciated on diffusion-weighted sequences, and to a lesser extent early arterial phases of contrast enhancement. This appearance otherwise very difficult to appreciate on remaining sequences. No individual discrete lesion. This is of uncertain significance, and as previously reported and discussed, differential considerations would specifically include infiltrative processes such as renal lymphomatous involvement or metastatic disease. Multifocal pyelonephritis could also have this appearance if there are clinically referable signs and symptoms and urinalysis evidence. 2. Diffusely heterogeneous enhancement of the vertebral bone marrow, which is worrisome for osseous metastatic disease, particularly in light of findings described above. 3. Hepatic steatosis.   03/07/2023 PET  1. Diffuse hypermetabolic bone disease without obvious CT correlate except involving the right first anterior rib which appears partially destroyed with an adjacent soft tissue mass. Findings consistent with diffuse osseous lymphoma. 2. Numerous small hypermetabolic mesenteric lymph nodes. 3. 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 4. Borderline enlarged spleen with mild hypermetabolism. 5. Small hypermetabolic subpleural nodular density in the superior segment of the right lower lobe. Possible pleural lymph node. 6. Small hypermetabolic focus associated with the left submandibular gland. Possible muscle lesion or adjacent lymph node. 7. Aortic atherosclerosis  During the interval he was admitted. S/p bone marrow biopsy on 03/25/23 CT guided renal lesion biopsy was not able to be done due to thrombocytopenia despite platelet transfusion.  Today he presents to discuss BM biopsy results.  Appetite is fair pain is better controlled with MS contin 15mg  BID and he takes percocet occasionally.  + constipation He took a dose of Dexamethasone 40mg  before being discharged on 03/26/2023. He was supposed to take Dexamethasone 40mg  daily for another 3 days, and he did not get Rx filled due to pharmacy does not have the supply.    MEDICAL HISTORY:  Past Medical History:  Diagnosis Date   Abnormal liver function test    Anxiety    Anxiety    Arthritis    Chronic lower back pain    Degenerative joint disease    Depression    Diabetes mellitus without complication (HCC)    Fatty liver    Heart murmur    Hepatitis B    Hyperlipidemia    Hypertension    Lumbar polyradiculopathy    Panic attacks    Polyneuropathy    Vertigo 05/03/2017   ER    SURGICAL HISTORY: Past Surgical History:  Procedure Laterality Date   COLONOSCOPY     COLONOSCOPY WITH PROPOFOL N/A 10/01/2017   Procedure: COLONOSCOPY WITH PROPOFOL;  Surgeon: Toledo, Boykin Nearing, MD;  Location: [facility] ENDOSCOPY;  Service: Endoscopy;  Laterality: N/A;   IR BONE MARROW BIOPSY & ASPIRATION  03/25/2023   PROSTATE BIOPSY  SOCIAL HISTORY: Social History   Socioeconomic History   Marital status: Single    Spouse name: denise    Number of children: 0   Years of education: Not on file   Highest education level: 11th grade  Occupational History    Comment: disabled  Tobacco Use   Smoking status: Never   Smokeless tobacco: Never  Vaping Use   Vaping status: Never Used  Substance and Sexual Activity   Alcohol use: No   Drug use: Not Currently    Types: Marijuana   Sexual activity: Not Currently  Other Topics Concern   Not on file  Social History Narrative   Not on file   Social Determinants of Health   Financial Resource Strain: Patient Declined (11/15/2022)   Received from [facility]   Overall Financial Resource Strain (CARDIA)    Difficulty of Paying Living Expenses: Patient declined  Food Insecurity: No Food Insecurity (03/25/2023)   Hunger Vital Sign    Worried About Running Out of Food in the Last Year: Never true    Ran Out of Food in the Last Year: Never true  Transportation Needs: No Transportation Needs (03/25/2023)   PRAPARE - Administrator, Civil Service (Medical): No    Lack of Transportation (Non-Medical): No  Physical Activity: Inactive (03/19/2017)   Exercise Vital Sign    Days of Exercise per Week: 0 days    Minutes of Exercise per Session: 0 min  Stress: Not on file  Social Connections: Moderately Isolated (03/19/2017)   Social Connection and Isolation Panel [NHANES]    Frequency of Communication with Friends and Family: Twice a week    Frequency of Social Gatherings with Friends and Family: Never    Attends Religious Services: Never    Database administrator or Organizations: No    Attends Banker Meetings: Never    Marital Status: Living with partner  Intimate Partner Violence: Not At Risk (03/25/2023)   Humiliation, Afraid, Rape, and Kick questionnaire    Fear of Current or Ex-Partner: No    Emotionally Abused: No    Physically Abused: No    Sexually Abused: No    FAMILY HISTORY: Family History  Problem Relation Age of Onset    Cancer Mother        Pancreatic   Diabetes Mother    Hypertension Mother    Varicose Veins Mother    Alcohol abuse Mother    Pancreatic cancer Mother    Cancer Father        Jaw/ oral   Heart disease Father        CABG   Aortic aneurysm Father    Congestive Heart Failure Father    Hypertension Father    Varicose Veins Father    Alcohol abuse Father    Bipolar disorder Sister    Bipolar disorder Brother     ALLERGIES:  is allergic to lisinopril.  MEDICATIONS:  Current Outpatient Medications  Medication Sig Dispense Refill   amLODipine (NORVASC) 5 MG tablet Take 5 mg by mouth daily.     Continuous Glucose Sensor (FREESTYLE LIBRE 3 SENSOR) MISC USE 1 SENSOR EVERY 14 DAYS     dexamethasone (DECADRON) 4 MG tablet Take 10 tablets (40 mg total) by mouth daily. 40 tablet 0   feeding supplement (ENSURE ENLIVE / ENSURE PLUS) LIQD Take 237 mLs by mouth 2 (two) times daily between meals.     glipiZIDE (GLUCOTROL XL) 5 MG 24 hr  tablet Take 5 mg by mouth daily with breakfast.     hydrOXYzine (ATARAX) 25 MG tablet Take 1 tablet (25 mg total) by mouth every 8 (eight) hours as needed for anxiety. 90 tablet 1   magnesium hydroxide (MILK OF MAGNESIA) 400 MG/5ML suspension Take 5 mLs by mouth daily as needed for mild constipation.     morphine (MS CONTIN) 15 MG 12 hr tablet Take 15 mg by mouth every 12 (twelve) hours.     oxyCODONE-acetaminophen (PERCOCET) 5-325 MG tablet Take 1-2 tablets by mouth every 4 (four) hours as needed for severe pain (pain score 7-10) or moderate pain (pain score 4-6). 120 tablet 0   polyethylene glycol (MIRALAX / GLYCOLAX) 17 g packet Take 17 g by mouth daily.     pravastatin (PRAVACHOL) 20 MG tablet Take 20 mg by mouth daily.     tamsulosin (FLOMAX) 0.4 MG CAPS capsule TAKE 1 CAPSULE(0.4 MG) BY MOUTH DAILY 90 capsule 3   TRADJENTA 5 MG TABS tablet Take 5 mg by mouth daily.     traZODone (DESYREL) 50 MG tablet Take 1 tablet (50 mg total) by mouth at bedtime as needed  for sleep. Stop Quetiapine 90 tablet 1   lidocaine (LIDODERM) 5 % Place 1 patch onto the skin every 12 (twelve) hours. Remove & Discard patch within 12 hours or as directed by MD (Patient not taking: Reported on 04/01/2023) 10 patch 0   No current facility-administered medications for this visit.    Review of Systems  Constitutional:  Positive for appetite change, fatigue and unexpected weight change. Negative for chills and fever.  HENT:   Negative for hearing loss and voice change.   Eyes:  Negative for eye problems.  Respiratory:  Negative for chest tightness and cough.   Cardiovascular:  Negative for chest pain.  Gastrointestinal:  Positive for abdominal pain and constipation. Negative for abdominal distention and blood in stool.  Endocrine: Negative for hot flashes.       Night sweats  Genitourinary:  Negative for difficulty urinating and frequency.   Musculoskeletal:  Positive for back pain. Negative for arthralgias.  Skin:  Negative for itching and rash.  Neurological:  Negative for extremity weakness.  Hematological:  Negative for adenopathy.  Psychiatric/Behavioral:  Negative for confusion.      PHYSICAL EXAMINATION: ECOG PERFORMANCE STATUS: 1 - Symptomatic but completely ambulatory  Vitals:   04/01/23 1443  BP: 122/66  Pulse: 86  Resp: 18  Temp: 98.9 F (37.2 C)   Filed Weights   04/01/23 1443  Weight: 259 lb 9.6 oz (117.8 kg)    Physical Exam Constitutional:      General: He is not in acute distress.    Appearance: He is obese. He is not diaphoretic.  HENT:     Head: Normocephalic and atraumatic.  Eyes:     General: No scleral icterus. Cardiovascular:     Rate and Rhythm: Normal rate and regular rhythm.  Pulmonary:     Effort: Pulmonary effort is normal. No respiratory distress.     Breath sounds: No wheezing.  Abdominal:     General: Bowel sounds are normal. There is no distension.     Palpations: Abdomen is soft.  Musculoskeletal:        General:  Normal range of motion.     Cervical back: Normal range of motion and neck supple.  Skin:    General: Skin is warm and dry.     Findings: No erythema.  Neurological:  Mental Status: He is alert and oriented to person, place, and time. Mental status is at baseline.     Cranial Nerves: No cranial nerve deficit.     Motor: No abnormal muscle tone.  Psychiatric:        Mood and Affect: Mood and affect normal.      LABORATORY DATA:  I have reviewed the data as listed    Latest Ref Rng & Units 04/01/2023    2:26 PM 03/26/2023    6:48 PM 03/26/2023    5:05 AM  CBC  WBC 4.0 - 10.5 K/uL 6.6  6.5  4.7   Hemoglobin 13.0 - 17.0 g/dL 8.0  7.9  8.1   Hematocrit 39.0 - 52.0 % 24.2  22.4  23.4   Platelets 150 - 400 K/uL 14  73  32       Latest Ref Rng & Units 04/01/2023    2:25 PM 03/25/2023    5:42 AM 03/24/2023   11:54 AM  CMP  Glucose 70 - 99 mg/dL 97  161  096   BUN 8 - 23 mg/dL 22  18  21    Creatinine 0.61 - 1.24 mg/dL 0.45  4.09  8.11   Sodium 135 - 145 mmol/L 130  133  129   Potassium 3.5 - 5.1 mmol/L 4.7  4.2  4.2   Chloride 98 - 111 mmol/L 98  98  94   CO2 22 - 32 mmol/L 23  26  27    Calcium 8.9 - 10.3 mg/dL 8.4  8.3  8.6   Total Protein 6.5 - 8.1 g/dL 6.6   7.2   Total Bilirubin <1.2 mg/dL 0.8   0.6   Alkaline Phos 38 - 126 U/L 91   105   AST 15 - 41 U/L 28   33   ALT 0 - 44 U/L 18   16      RADIOGRAPHIC STUDIES: I have personally reviewed the radiological images as listed and agreed with the findings in the report. IR BONE MARROW BIOPSY & ASPIRATION  Result Date: 03/25/2023 INDICATION: 67 year old male referred for bone marrow biopsy EXAM: IR BONE MARRO BIOPY AND ASPIRATION MEDICATIONS: None. ANESTHESIA/SEDATION: Moderate (conscious) sedation was employed during this procedure. A total of Versed 1.0 mg and Fentanyl 50 mcg was administered intravenously. Moderate Sedation Time: 10 minutes. The patient's level of consciousness and vital signs were monitored  continuously by radiology nursing throughout the procedure under my direct supervision. FLUOROSCOPY TIME:  Fluoroscopy Time:   (16 mGy). COMPLICATIONS: None PROCEDURE: Informed written consent was obtained from the patient after a thorough discussion of the procedural risks, benefits and alternatives. All questions were addressed. Maximal Sterile Barrier Technique was utilized including caps, mask, sterile gowns, sterile gloves, sterile drape, hand hygiene and skin antiseptic. A timeout was performed prior to the initiation of the procedure. The procedure risks, benefits, and alternatives were explained to the patient. Questions regarding the procedure were encouraged and answered. The patient understands and consents to the procedure. Patient was positioned prone under the image intensifier. Physical exam was used to determine the L5- S1 level, and then the posterior pelvis was prepped with Chlorhexidine in a sterile fashion. A sterile drape was applied covering the operative field. Sterile gown/sterile gloves were used for the procedure. Local anesthesia was provided with 1% Lidocaine. Posterior right iliac bone was targeted for biopsy. The skin and subcutaneous tissues were infiltrated with 1% lidocaine without epinephrine. A small stab incision was made with an 11  blade scalpel, and an 11 gauge [name] needle was advanced with fluoro guidance to the posterior cortex. Manual forced was used to advance the needle through the posterior cortex and the stylet was removed. A bone marrow aspirate was retrieved and passed to a cytotechnologist in the room. The [name] needle was then advanced without the stylet for a core biopsy. The core biopsy was retrieved and also passed to a cytotechnologist. Manual pressure was used for hemostasis and a sterile dressing was placed. No complications were encountered no significant blood loss was encountered. Patient tolerated the procedure well and remained hemodynamically stable  throughout. IMPRESSION: Status post image guided bone marrow biopsy. Signed, Yvone Neu. Miachel Roux, RPVI [facility] Electronically Signed   By: Gilmer Mor D.O.   On: 03/25/2023 10:09   NM PET Image Initial (PI) Skull Base To Thigh  Result Date: 03/10/2023 CLINICAL DATA:  Initial treatment strategy for renal lesions and abnormal blood smear. EXAM: NUCLEAR MEDICINE PET SKULL BASE TO THIGH TECHNIQUE: 12.69 mCi F-18 FDG was injected intravenously. Full-ring PET imaging was performed from the skull base to thigh after the radiotracer. CT data was obtained and used for attenuation correction and anatomic localization. Fasting blood glucose: 173 mg/dl COMPARISON:  Prior recent CT and MRI examinations. FINDINGS: Mediastinal blood pool activity: SUV max 2.06 Liver activity: SUV max 4.82 NECK: Single small hypermetabolic focus associated with the left submandibular gland. Possible muscle lesion or adjacent lymph node. No other significant findings in the neck. Incidental CT findings: None. CHEST: Lytic destructive hypermetabolic bone lesion involving the anterior aspect of the first rib with an associated soft tissue component. SUV max is 9.0. No enlarged or hypermetabolic mediastinal or hilar lymph nodes. No hypermetabolic axillary or supraclavicular nodes. Small hypermetabolic subpleural nodular density in the superior segment of the right lower lobe. SUV max is 4.22. Possible pleural lymph node. A few other small scattered pulmonary nodules are noted on the CT scan. Incidental CT findings: Incidental aberrant right subclavian artery. Scattered coronary artery calcifications. Calcifications noted around the aortic valve. ABDOMEN/PELVIS: No hypermetabolic hepatic or splenic lesions. The spleen is borderline enlarged and demonstrates SUV max of 4.91, slightly greater than that of the liver. Difficult to identify any definite renal lesions due to the  markedly hypermetabolic collecting systems but they do appear to be somewhat heterogeneous. Numerous small mesenteric lymph nodes as seen on the prior CT scan. These demonstrate hypermetabolism. Index node on image 105/6 measures 14 mm and has an SUV max of 6.05. 9 mm node on image 29/6 has an SUV max of 6.32 No retroperitoneal or pelvic lymphadenopathy. No inguinal lymphadenopathy. There Incidental CT findings: Scattered aortic and iliac artery calcifications. Small bilateral renal calculi. Enlarged prostate gland with median lobe hypertrophy impressing on the base of the bladder. SKELETON: Diffuse hypermetabolic bone disease without obvious CT correlate except involving the right first anterior rib which appears partially destroyed with an adjacent soft tissue mass. Left sacral disease has an SUV max of 6.41. Left-sided L5 lesion has an SUV max of 7.6. T4 lesion has an SUV max of 7.03. Right femoral head lesion has an SUV max of 6.9. Incidental CT findings: None. IMPRESSION: 1. Diffuse hypermetabolic bone disease without obvious CT correlate except involving the right first anterior rib which appears partially destroyed with an adjacent soft tissue mass. Findings consistent with diffuse osseous lymphoma. 2. Numerous small hypermetabolic mesenteric lymph nodes. 3. 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 4. Borderline enlarged spleen with mild hypermetabolism. 5. Small hypermetabolic subpleural nodular density in the superior segment of the right lower lobe. Possible pleural lymph node. 6. Small hypermetabolic focus associated with the left submandibular gland. Possible muscle lesion or adjacent lymph node. 7. Aortic atherosclerosis. Aortic Atherosclerosis (ICD10-I70.0). Electronically Signed   By: Rudie Meyer M.D.   On: 03/10/2023 12:43   CT ABDOMEN PELVIS W  CONTRAST  Result Date: 03/07/2023 CLINICAL DATA:  Acute nonlocalized abdominal pain, back pain EXAM: CT ABDOMEN AND PELVIS WITH CONTRAST TECHNIQUE: Multidetector CT imaging of the abdomen and pelvis was performed using the standard protocol following bolus administration of intravenous contrast. RADIATION DOSE REDUCTION: This exam was performed according to the departmental dose-optimization program which includes automated exposure control, adjustment of the mA and/or kV according to patient size and/or use of iterative reconstruction technique. CONTRAST:  OMNIPAQUE IOHEXOL 350 MG/ML SOLN COMPARISON:  MRI 02/28/2023, CT 02/23/2023 FINDINGS: Lower chest: No acute abnormality. Hepatobiliary: No focal liver abnormality is seen. No gallstones, gallbladder wall thickening, or biliary dilatation. Pancreas: Unremarkable Spleen: Normal in size without focal abnormality. Adrenals/Urinary Tract: The adrenal glands are unremarkable. The kidneys are normal in size and position. There is heterogeneous cortical enhancement bilaterally, identical to prior examinations of 02/23/2023 and 02/17/2023 with areas of corresponding restricted diffusion and hypoenhancement noted on a prior MRI examination suspicious for lymphomatous or leukemic infiltration. No hydronephrosis. No perinephric inflammatory stranding or fluid collections are seen. No intrarenal or ureteral calculi are identified. The bladder is unremarkable. Stomach/Bowel: Stomach, small bowel, and large bowel are unremarkable. Appendix normal. No free intraperitoneal gas or fluid. Vascular/Lymphatic: Shotty adenopathy is again seen within the mesenteric root measuring up to 10 mm in short axis diameter. No additional pathologic adenopathy within the abdomen and pelvis. Mild aortoiliac atherosclerotic calcification. No aortic aneurysm. Reproductive: Marked prostatic hypertrophy. Other: No abdominal wall hernia Musculoskeletal: 6 no acute bone abnormality. No lytic or  blastic bone lesion. IMPRESSION: 1. No acute intra-abdominal pathology identified. No definite radiographic explanation for the patient's reported symptoms. 2. Heterogeneous cortical enhancement bilaterally, identical to prior examinations of 02/23/2023 and 02/17/2023 with areas of corresponding restricted diffusion and hypoenhancement noted on a prior MRI examination suspicious for lymphomatous or leukemic infiltration. PET CT examination or percutaneous needle biopsy may be helpful for further evaluation 3. Shotty adenopathy within the mesenteric root, unchanged. 4. Marked prostatic hypertrophy. Aortic Atherosclerosis (ICD10-I70.0). Electronically Signed   By: Helyn Numbers M.D.   On: 03/07/2023 02:17

## 2023-04-01 NOTE — Assessment & Plan Note (Signed)
Due to ALL

## 2023-04-05 LAB — BCR-ABL1 FISH
Cells Analyzed: 200
Cells Counted: 200

## 2023-04-06 LAB — BCR-ABL1, CML/ALL, PCR, QUANT
E1A2 Transcript: <0.0032 %
Interpretation (BCRAL):: NEGATIVE
b2a2 transcript: <0.0032 %
b3a2 transcript: <0.0032 %

## 2023-04-08 ENCOUNTER — Encounter (HOSPITAL_COMMUNITY): Payer: Self-pay | Admitting: Oncology

## 2023-05-06 ENCOUNTER — Telehealth: Payer: Self-pay | Admitting: Psychiatry

## 2023-05-06 ENCOUNTER — Telehealth: Payer: Self-pay

## 2023-05-06 NOTE — Telephone Encounter (Signed)
 Please let patient know he needs to be evaluated for medication changes.  Please placed this patient on a list to call in the next few days if we have any no shows or cancellations please put him on a priority list.

## 2023-05-06 NOTE — Telephone Encounter (Signed)
 The patient, with a recent diagnosis of cancer, has been undergoing chemotherapy and infusion treatments at a cancer hospital. He reports that the cumulative effects of the treatments have begun to manifest, leading to heightened anxiety and panic attacks. To manage these symptoms, the patient has been prescribed Trazodone , which he takes at bedtime. Despite this, he continues to experience significant nervousness and agitation.  The patient is scheduled for further chemotherapy treatments, which will require a four-day hospital admission. He expresses feeling overwhelmed by the rapid succession of appointments and treatments, and the unexpected nature of his diagnosis. The patient acknowledges that the stress of his situation is catching up with him, leading to a state of being rattled.  Discussed addition of lorazepam  low dosage for anxiety symptoms.  Patient to discuss with oncologist and will let this provider know.

## 2023-05-06 NOTE — Telephone Encounter (Signed)
 left message notify pt. and gave information per dr. Elna Breslow order. pt has been placed on waitlist.

## 2023-05-06 NOTE — Telephone Encounter (Signed)
 pt states that he has leukemia and he was in the hospital for 2 weeks in december. pt states he is having a lot of anxiety and stress and worry and he wants to know if you can give him something that will not interfer with his como treatment

## 2023-05-07 NOTE — Telephone Encounter (Signed)
 pt left a message that his doctor at [facility] gave him something.

## 2023-05-24 ENCOUNTER — Other Ambulatory Visit: Payer: Self-pay | Admitting: Urology

## 2023-05-24 DIAGNOSIS — N401 Enlarged prostate with lower urinary tract symptoms: Secondary | ICD-10-CM

## 2023-05-28 ENCOUNTER — Telehealth: Payer: Self-pay | Admitting: Psychiatry

## 2023-05-28 NOTE — Telephone Encounter (Signed)
Noted and thank you

## 2023-07-09 ENCOUNTER — Ambulatory Visit (INDEPENDENT_AMBULATORY_CARE_PROVIDER_SITE_OTHER): Payer: Medicare HMO | Admitting: Psychiatry

## 2023-07-09 ENCOUNTER — Encounter: Payer: Self-pay | Admitting: Psychiatry

## 2023-07-09 VITALS — BP 132/82 | HR 107 | Temp 98.4°F | Ht 74.0 in | Wt 247.0 lb

## 2023-07-09 DIAGNOSIS — F410 Panic disorder [episodic paroxysmal anxiety] without agoraphobia: Secondary | ICD-10-CM | POA: Diagnosis not present

## 2023-07-09 DIAGNOSIS — F3342 Major depressive disorder, recurrent, in full remission: Secondary | ICD-10-CM | POA: Diagnosis not present

## 2023-07-09 DIAGNOSIS — F4010 Social phobia, unspecified: Secondary | ICD-10-CM

## 2023-07-09 DIAGNOSIS — F5101 Primary insomnia: Secondary | ICD-10-CM

## 2023-07-09 DIAGNOSIS — F4322 Adjustment disorder with anxiety: Secondary | ICD-10-CM

## 2023-07-09 NOTE — Progress Notes (Unsigned)
 [facility] MD OP Progress Note  07/09/2023 2:05 PM Kyle Hood  MRN:  [id number]  Chief Complaint:  Chief Complaint  Patient presents with   Follow-up   Anxiety   Medication Refill   HPI: Kyle Hood is a 68 year old transgendered male to male lives in [place] with partner, has a history of MDD, panic disorder, social anxiety disorder, was evaluated in office today.  He experiences significant anxiety related to his leukemia treatment and medical procedures, particularly lumbar punctures. He has previously found relief with Valium during procedures but is concerned about the addictive potential of benzodiazepines. He is not currently taking lorazepam due to concerns about side effects but is open to trying it. He is currently taking Hydroxyzine for anxiety, though he has not used it recently.  He has difficulty sleeping, particularly after discontinuing Blinatumomab. He has used trazodone occasionally for sleep and is considering using it more regularly. He is concerned about the sedative effects of medications during the day and wants a medication that can help him relax without causing drowsiness.  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 He recently completed a four-week course of Blinatumomab, which was administered via a port, following a previous regimen of chemotherapy that began in December. During chemotherapy, he was hospitalized for 14 days and required blood and platelet infusions. He describes a challenging experience with a fever that led to an emergency room visit, which he attributes to being discharged too early after his Blinatumomab treatment.  He describes his living situation as stressful, with a disruptive neighbor and concerns about his partner's well-being. His partner, Kyle Hood, has been very supportive  throughout his treatment. He also shares his fears about the progression of his leukemia and the impact it has on his quality of life.  He currently denies any suicidality, homicidality or perceptual disturbances.   Visit Diagnosis:    ICD-10-CM   1. MDD (major depressive disorder), recurrent, in full remission (HCC)  F33.42     2. Social anxiety disorder  F40.10     3. Panic disorder  F41.0     4. Primary insomnia  F51.01       Past Psychiatric History: I have reviewed past psychiatric history from progress note on 06/05/2017.  Past trials of Zoloft, Wellbutrin.  Past Medical History:  Past Medical History:  Diagnosis Date   Abnormal liver function test    Anxiety    Anxiety    Arthritis    Chronic lower back pain    Degenerative joint disease    Depression    Diabetes mellitus without complication (HCC)    Fatty liver    Heart murmur    Hepatitis B    Hyperlipidemia    Hypertension    Lumbar polyradiculopathy    Panic attacks    Polyneuropathy    Vertigo 05/03/2017   ER    Past Surgical History:  Procedure Laterality Date   COLONOSCOPY     COLONOSCOPY WITH PROPOFOL N/A 10/01/2017   Procedure: COLONOSCOPY WITH PROPOFOL;  Surgeon: Toledo, Boykin Nearing, MD;  Location: [facility] ENDOSCOPY;  Service: Endoscopy;  Laterality: N/A;   IR BONE MARROW BIOPSY & ASPIRATION  03/25/2023   PROSTATE BIOPSY      Family Psychiatric History: I have reviewed family psychiatric history from progress note on 06/05/2017.  Family History:  Family History  Problem Relation Age of Onset  Cancer Mother        Pancreatic   Diabetes Mother    Hypertension Mother    Varicose Veins Mother    Alcohol abuse Mother    Pancreatic cancer Mother    Cancer Father        Jaw/ oral   Heart disease Father        CABG   Aortic aneurysm Father    Congestive Heart Failure Father    Hypertension Father    Varicose Veins Father    Alcohol abuse Father    Bipolar disorder Sister    Bipolar disorder  Brother     Social History: I have reviewed social history from progress note on 06/05/2017. Social History   Socioeconomic History   Marital status: Single    Spouse name: denise   Number of children: 0   Years of education: Not on file   Highest education level: 11th grade  Occupational History    Comment: disabled  Tobacco Use   Smoking status: Never   Smokeless tobacco: Never  Vaping Use   Vaping status: Never Used  Substance and Sexual Activity   Alcohol use: No   Drug use: Not Currently    Types: Marijuana   Sexual activity: Not Currently  Other Topics Concern   Not on file  Social History Narrative   Not on file   Social Drivers of Health   Financial Resource Strain: Low Risk  (04/17/2023)   Received from [facility]   Overall Financial Resource Strain (CARDIA)    Difficulty of Paying Living Expenses: Not very hard  Food Insecurity: No Food Insecurity (04/17/2023)   [facility] from [facility]   Hunger Vital Sign    Worried About Running Out of Food in the Last Year: Never true    Ran Out of Food in the Last Year: Never true  Transportation Needs: No Transportation Needs (04/29/2023)   Received from [facility]   PRAPARE - Transportation    Lack of Transportation (Non-Medical): No    Lack of Transportation (Medical): No  Physical Activity: Inactive (03/19/2017)   Exercise Vital Sign    Days of Exercise per Week: 0 days    Minutes of Exercise per Session: 0 min  Stress: Not on file  Social Connections: Moderately Isolated (03/19/2017)   Social Connection and Isolation Panel [NHANES]    Frequency of Communication with Friends and Family: Twice a week    Frequency of Social Gatherings with Friends and Family: Never    Attends Religious Services: Never    Database administrator or Organizations: No    Attends Banker Meetings: Never    Marital Status: Living with partner    Allergies:  Allergies  Allergen Reactions   Lisinopril  Cough    Metabolic Disorder Labs: Lab Results  Component Value Date   HGBA1C 7.3 (H) 03/24/2023   MPG 162.81 03/24/2023   No results found for: "PROLACTIN" Lab Results  Component Value Date   CHOL 145 08/16/2014   TRIG 320 (A) 08/16/2014   HDL 36 08/16/2014   LDLCALC 45 08/16/2014   Lab Results  Component Value Date   TSH 2.794 02/26/2023   TSH 1.61 08/16/2014    Therapeutic Level Labs: No results found for: "LITHIUM" No results found for: "VALPROATE" No results found for: "CBMZ"  Current Medications: Current Outpatient Medications  Medication Sig Dispense Refill   amLODipine (NORVASC) 5 MG tablet Take 5 mg by mouth daily.  Continuous Glucose Sensor (FREESTYLE LIBRE 3 SENSOR) MISC USE 1 SENSOR EVERY 14 DAYS     dexamethasone (DECADRON) 4 MG tablet Take 10 tablets (40 mg total) by mouth daily. 40 tablet 0   feeding supplement (ENSURE ENLIVE / ENSURE PLUS) LIQD Take 237 mLs by mouth 2 (two) times daily between meals.     furosemide (LASIX) 20 MG tablet Take by mouth.     glipiZIDE (GLUCOTROL XL) 5 MG 24 hr tablet Take 5 mg by mouth daily with breakfast.     hydrOXYzine (ATARAX) 25 MG tablet Take 1 tablet (25 mg total) by mouth every 8 (eight) hours as needed for anxiety. 90 tablet 1   magnesium hydroxide (MILK OF MAGNESIA) 400 MG/5ML suspension Take 5 mLs by mouth daily as needed for mild constipation.     polyethylene glycol (MIRALAX / GLYCOLAX) 17 g packet Take 17 g by mouth daily.     potassium chloride (KLOR-CON M) 10 MEQ tablet Take 10 mEq by mouth 2 (two) times daily.     pravastatin (PRAVACHOL) 20 MG tablet Take 20 mg by mouth daily.     tamsulosin (FLOMAX) 0.4 MG CAPS capsule TAKE 1 CAPSULE(0.4 MG) BY MOUTH DAILY 90 capsule 0   TRADJENTA 5 MG TABS tablet Take 5 mg by mouth daily.     traZODone (DESYREL) 50 MG tablet Take 1 tablet (50 mg total) by mouth at bedtime as needed for sleep. Stop Quetiapine 90 tablet 1   valACYclovir (VALTREX) 500 MG tablet Take 500 mg  by mouth 2 (two) times daily.     LORazepam (ATIVAN) 0.5 MG tablet Take by mouth. (Patient not taking: Reported on 07/09/2023)     No current facility-administered medications for this visit.     Musculoskeletal: Strength & Muscle Tone: within normal limits Gait & Station: normal Patient leans: N/A  Psychiatric Specialty Exam: Review of Systems  Psychiatric/Behavioral:  Positive for sleep disturbance. The patient is nervous/anxious.     Blood pressure 132/82, pulse (!) 107, temperature 98.4 F (36.9 C), temperature source Temporal, height 6\' 2"  (1.88 m), weight 247 lb (112 kg), SpO2 99%.Body mass index is 31.71 kg/m.  General Appearance: Fairly Groomed  Eye Contact:  Fair  Speech:  Clear and Coherent  Volume:  Normal  Mood:  Anxious  Affect:  Appropriate  Thought Process:  Goal Directed and Descriptions of Associations: Intact  Orientation:  Full (Time, Place, and Person)  Thought Content: Logical   Suicidal Thoughts:  No  Homicidal Thoughts:  No  Memory:  Immediate;   Fair Recent;   Fair Remote;   Fair  Judgement:  Fair  Insight:  Fair  Psychomotor Activity:  Normal  Concentration:  Concentration: Fair and Attention Span: Fair  Recall:  [employer] of Knowledge: Fair  Language: Fair  Akathisia:  No  Handed:  Right  AIMS (if indicated): not done  Assets:  Desire for Improvement Housing Talents/Skills Transportation  ADL's:  Intact  Cognition: WNL  Sleep:  Poor   Screenings: Geneticist, molecular Office Visit from 11/19/2021 in [facility] Office Visit from 08/23/2021 in [facility] Office Visit from 05/17/2021 in [facility]  AIMS Total Score 0 0 0      GAD-7    Flowsheet Row Office Visit from 01/08/2023 in [facility] Office Visit from 10/02/2022 in [facility]  Office Visit from  04/01/2022 in [facility] Office Visit from 11/19/2021 in [facility] Office Visit from 08/23/2021 in [facility]  Total GAD-7 Score 0 2 0 3 0      PHQ2-9    Flowsheet Row Office Visit from 02/26/2023 in [facility] Office Visit from 01/08/2023 in [facility] Office Visit from 10/02/2022 in [facility] Office Visit from 04/01/2022 in [facility] Office Visit from 11/19/2021 in [facility]  PHQ-2 Total Score 0 0 0 0 2  PHQ-9 Total Score -- -- -- -- 5      Flowsheet Row ED to Hosp-Admission (Discharged) from 03/24/2023 in [facility] ED from 03/06/2023 in [facility] Emergency Department at [facility] ED from 02/23/2023 in [facility] Emergency Department at [facility]  C-SSRS RISK CATEGORY No Risk No Risk No Risk        Assessment and Plan: Taliesin Hartlage is a 68 year old male to male transgender, has a history of depression, anxiety presented for a follow-up appointment.  Discussed assessment and plan as noted below.  Adjustment disorder with anxiety-unstable Experiences anxiety related to leukemia treatment and procedures, such as lumbar punctures. Finds diazepam effective for procedural anxiety but concerned about addiction. Has not taken lorazepam due to side effect concerns but is open to trying it. Not interested in therapy due to logistical challenges and skepticism about benefits. - Try Lorazepam 0.5 mg by cutting the tablet in half and taking as needed to assess its effects. - Consider using Trazodone at night for sleep and anxiety, starting with a half tablet if needed. -  Discuss the possibility of Diazepam if Lorazepam is not effective. - Discussed referral for psychotherapy, patient declines.  Depression in remission Currently denies any significant depression symptoms other than sleep issues which are multifactorial. -We will reevaluate the need for antidepressants in the future.  Social anxiety disorder/Panic disorder-stable Currently denies any significant concerns. - Continue Hydroxyzine 25 mg 3 times a day as needed.  Insomnia-unstable Currently he does report sleep issues, multifactorial including going through chemotherapy, having a port, discomfort. - Continue Trazodone 50 mg at bedtime as needed - Discussed sleep hygiene techniques   Follow-up - Follow-up in clinic in 2 months or sooner if needed.  Collaboration of Care: Collaboration of Care: Patient refused AEB patient declines referral for therapy.  Patient/Guardian was advised Release of Information must be obtained prior to any record release in order to collaborate their care with an outside provider. Patient/Guardian was advised if they have not already done so to contact the registration department to sign all necessary forms in order for [place] to release information regarding their care.   Consent: Patient/Guardian gives verbal consent for treatment and assignment of benefits for services provided during this visit. Patient/Guardian expressed understanding and agreed to proceed.  Discussed the use of a AI scribe software for clinical note transcription with the patient, who gave verbal consent to proceed.  This note was generated in part or whole with voice recognition software. Voice recognition is usually quite accurate but there are transcription errors that can and very often do occur. I apologize for any typographical errors that were not detected and corrected.     Jomarie Longs, MD 07/09/2023, 2:05 PM

## 2023-07-10 ENCOUNTER — Encounter: Payer: Self-pay | Admitting: Psychiatry

## 2023-07-10 ENCOUNTER — Telehealth: Payer: Self-pay | Admitting: Psychiatry

## 2023-07-10 DIAGNOSIS — F410 Panic disorder [episodic paroxysmal anxiety] without agoraphobia: Secondary | ICD-10-CM

## 2023-07-10 MED ORDER — DIAZEPAM 5 MG PO TABS: 5.0000 mg | ORAL_TABLET | Freq: Every day | ORAL | 0 refills | Status: AC | PRN

## 2023-07-10 NOTE — Telephone Encounter (Signed)
 I have sent Valium prescription to pharmacy at [facility] as requested.

## 2023-07-10 NOTE — Telephone Encounter (Signed)
 I had asked him to try Lorazepam first and then go from there . He does have Lorazepam supplies with him.

## 2023-07-10 NOTE — Telephone Encounter (Signed)
 This was sent out by his other provider after he discussed it with me . He had told me yesterday in session he still has it and did not use it . So I asked him to try it first since its in the same class as Valium.

## 2023-07-11 DIAGNOSIS — F4322 Adjustment disorder with anxiety: Secondary | ICD-10-CM | POA: Insufficient documentation

## 2023-08-05 ENCOUNTER — Ambulatory Visit: Payer: Medicare HMO | Admitting: Urology

## 2023-08-22 ENCOUNTER — Other Ambulatory Visit: Payer: Self-pay | Admitting: Urology

## 2023-08-22 DIAGNOSIS — N401 Enlarged prostate with lower urinary tract symptoms: Secondary | ICD-10-CM

## 2023-08-27 ENCOUNTER — Telehealth: Payer: Self-pay

## 2023-08-27 DIAGNOSIS — N401 Enlarged prostate with lower urinary tract symptoms: Secondary | ICD-10-CM

## 2023-08-27 MED ORDER — TAMSULOSIN HCL 0.4 MG PO CAPS
ORAL_CAPSULE | ORAL | 3 refills | Status: AC
Start: 2023-08-27 — End: ?

## 2023-08-27 NOTE — Telephone Encounter (Signed)
 Pt calls triage he states that he is currently undergoing cancer treatments at [facility] and is unable to make it to a follow up appointment at this time he is requesting short term supply until he is able to come in. RX sent in.

## 2023-09-15 ENCOUNTER — Encounter: Payer: Self-pay | Admitting: Psychiatry

## 2023-09-15 ENCOUNTER — Ambulatory Visit (INDEPENDENT_AMBULATORY_CARE_PROVIDER_SITE_OTHER): Admitting: Psychiatry

## 2023-09-15 VITALS — BP 128/78 | HR 58 | Temp 98.2°F | Ht 74.0 in | Wt 252.6 lb

## 2023-09-15 DIAGNOSIS — F5101 Primary insomnia: Secondary | ICD-10-CM | POA: Diagnosis not present

## 2023-09-15 DIAGNOSIS — F410 Panic disorder [episodic paroxysmal anxiety] without agoraphobia: Secondary | ICD-10-CM

## 2023-09-15 DIAGNOSIS — F4010 Social phobia, unspecified: Secondary | ICD-10-CM | POA: Diagnosis not present

## 2023-09-15 DIAGNOSIS — F3342 Major depressive disorder, recurrent, in full remission: Secondary | ICD-10-CM

## 2023-09-15 DIAGNOSIS — F4322 Adjustment disorder with anxiety: Secondary | ICD-10-CM

## 2023-09-15 NOTE — Progress Notes (Signed)
 [facility] MD OP Progress Note  09/15/2023 12:56 PM Kyle Hood  MRN:  [id number]  Chief Complaint:  Chief Complaint  Patient presents with   Follow-up   Depression   Anxiety   Medication Refill    [id number] Kyle Hood is a 68 year old transgendered male to male, lives in [place] with partner, has a history of MDD, panic disorder, social anxiety disorder was evaluated in office today for a follow-up appointment.  He continues to have anxiety related to his leukemia treatment and medical procedures although has been coping better than previously.  He believes his treatment is currently going in the right direction and he got positive feedback from his providers.  He may be in remission however has to continue the treatment once a week now.  His partner helps with transportation once weekly.  However the wait time for the treatment can be anxiety provoking.  He does have hydroxyzine  available which he takes as needed and that has been beneficial.  He is trying to limit use.  He has not been using lorazepam  ordered Valium  as needed.  Reports sleep is overall good.  Uses the trazodone  only as needed.  He has not needed it in a long time.  Denies any significant depression symptoms.  Reports appetite as fair.  Denies any suicidality, homicidality or perceptual disturbances.  Denies any other concerns today.    Visit Diagnosis:    ICD-10-CM   1. MDD (major depressive disorder), recurrent, in full remission (HCC)  F33.42     2. Social anxiety disorder  F40.10     3. Panic disorder  F41.0     4. Primary insomnia  F51.01     5. Adjustment disorder with anxious mood  F43.22       Past Psychiatric History: I have reviewed past psychiatric history from progress note on 06/05/2017.  Past trials of Zoloft, Wellbutrin .  Past Medical History:  Past Medical History:  Diagnosis Date   Abnormal liver function test    Anxiety    Anxiety    Arthritis    Chronic lower back pain     Degenerative joint disease    Depression    Diabetes mellitus without complication (HCC)    Fatty liver    Heart murmur    Hepatitis B    Hyperlipidemia    Hypertension    Lumbar polyradiculopathy    Panic attacks    Polyneuropathy    Vertigo 05/03/2017   ER    Past Surgical History:  Procedure Laterality Date   COLONOSCOPY     COLONOSCOPY WITH PROPOFOL  N/A 10/01/2017   Procedure: COLONOSCOPY WITH PROPOFOL ;  Surgeon: Toledo, Alphonsus Jeans, MD;  Location: [facility] ENDOSCOPY;  Service: Endoscopy;  Laterality: N/A;   IR BONE MARROW BIOPSY & ASPIRATION  03/25/2023   PROSTATE BIOPSY      Family Psychiatric History: I have reviewed family psychiatric history from progress note on 06/05/2017.  Family History:  Family History  Problem Relation Age of Onset   Cancer Mother        Pancreatic   Diabetes Mother    Hypertension Mother    Varicose Veins Mother    Alcohol abuse Mother    Pancreatic cancer Mother    Cancer Father        Jaw/ oral   Heart disease Father        CABG   Aortic aneurysm Father    Congestive Heart Failure Father    Hypertension Father    Varicose  Veins Father    Alcohol abuse Father    Bipolar disorder Sister    Bipolar disorder Brother     Social History: I have reviewed social history from progress note on 06/05/2017. Social History   Socioeconomic History   Marital status: Single    Spouse name: denise   Number of children: 0   Years of education: Not on file   Highest education level: 11th grade  Occupational History    Comment: disabled  Tobacco Use   Smoking status: Never   Smokeless tobacco: Never  Vaping Use   Vaping status: Never Used  Substance and Sexual Activity   Alcohol use: No   Drug use: Not Currently    Types: Marijuana   Sexual activity: Not Currently  Other Topics Concern   Not on file  Social History Narrative   Not on file   Social Drivers of Health   Financial Resource Strain: Low Risk  (04/17/2023)   [facility]   Overall Financial Resource Strain (CARDIA)    Difficulty of Paying Living Expenses: Not very hard  Food Insecurity: No Food Insecurity (04/17/2023)   Received from [facility]   Hunger Vital Sign    Worried About Running Out of Food in the Last Year: Never true    Ran Out of Food in the Last Year: Never true  Transportation Needs: No Transportation Needs (04/29/2023)   Received from [facility]   PRAPARE - Transportation    Lack of Transportation (Non-Medical): No    Lack of Transportation (Medical): No  Physical Activity: Inactive (03/19/2017)   Exercise Vital Sign    Days of Exercise per Week: 0 days    Minutes of Exercise per Session: 0 min  Stress: Not on file  Social Connections: Moderately Isolated (03/19/2017)   Social Connection and Isolation Panel [NHANES]    Frequency of Communication with Friends and Family: Twice a week    Frequency of Social Gatherings with Friends and Family: Never    Attends Religious Services: Never    Database administrator or Organizations: No    Attends Banker Meetings: Never    Marital Status: Living with partner    Allergies:  Allergies  Allergen Reactions   Lisinopril Cough    Metabolic Disorder Labs: Lab Results  Component Value Date   HGBA1C 7.3 (H) 03/24/2023   MPG 162.81 03/24/2023   No results found for: "PROLACTIN" Lab Results  Component Value Date   CHOL 145 08/16/2014   TRIG 320 (A) 08/16/2014   HDL 36 08/16/2014   LDLCALC 45 08/16/2014   Lab Results  Component Value Date   TSH 2.794 02/26/2023   TSH 1.61 08/16/2014    Therapeutic Level Labs: No results found for: "LITHIUM" No results found for: "VALPROATE" No results found for: "CBMZ"  Current Medications: Current Outpatient Medications  Medication Sig Dispense Refill   dapsone 100 MG tablet Take 100 mg by mouth daily.     melatonin 3 MG TABS tablet Take 3-6 mg by mouth.     tamsulosin  (FLOMAX ) 0.4 MG CAPS capsule TAKE 1  CAPSULE(0.4 MG) BY MOUTH DAILY 90 capsule 3   TRADJENTA 5 MG TABS tablet Take 5 mg by mouth daily.     valACYclovir (VALTREX) 500 MG tablet Take 500 mg by mouth 2 (two) times daily.     amLODipine  (NORVASC ) 5 MG tablet Take 5 mg by mouth daily.     Continuous Glucose Sensor (  FREESTYLE LIBRE 3 SENSOR) MISC USE 1 SENSOR EVERY 14 DAYS     dexamethasone  (DECADRON ) 4 MG tablet Take 10 tablets (40 mg total) by mouth daily. 40 tablet 0   feeding supplement (ENSURE ENLIVE / ENSURE PLUS) LIQD Take 237 mLs by mouth 2 (two) times daily between meals.     furosemide (LASIX) 20 MG tablet Take by mouth.     glipiZIDE (GLUCOTROL XL) 5 MG 24 hr tablet Take 5 mg by mouth daily with breakfast.     hydrOXYzine  (ATARAX ) 25 MG tablet Take 1 tablet (25 mg total) by mouth every 8 (eight) hours as needed for anxiety. 90 tablet 1   magnesium  hydroxide (MILK OF MAGNESIA) 400 MG/5ML suspension Take 5 mLs by mouth daily as needed for mild constipation.     polyethylene glycol (MIRALAX  / GLYCOLAX ) 17 g packet Take 17 g by mouth daily.     potassium chloride (KLOR-CON M) 10 MEQ tablet Take 10 mEq by mouth 2 (two) times daily.     pravastatin  (PRAVACHOL ) 20 MG tablet Take 20 mg by mouth daily.     traZODone  (DESYREL ) 50 MG tablet Take 1 tablet (50 mg total) by mouth at bedtime as needed for sleep. Stop Quetiapine  90 tablet 1   No current facility-administered medications for this visit.     Musculoskeletal: Strength & Muscle Tone: within normal limits Gait & Station: normal Patient leans: N/A  Psychiatric Specialty Exam: Review of Systems  Psychiatric/Behavioral:  The patient is nervous/anxious.     Blood pressure 128/78, pulse (!) 58, temperature 98.2 F (36.8 C), temperature source Temporal, height 6\' 2"  (1.88 m), weight 252 lb 9.6 oz (114.6 kg), SpO2 93%.Body mass index is 32.43 kg/m.  General Appearance: Casual  Eye Contact:  Fair  Speech:  Clear and Coherent  Volume:  Normal  Mood:  Anxious coping better   Affect:  Appropriate  Thought Process:  Goal Directed and Descriptions of Associations: Intact  Orientation:  Full (Time, Place, and Person)  Thought Content: Logical   Suicidal Thoughts:  No  Homicidal Thoughts:  No  Memory:  Immediate;   Fair Recent;   Fair Remote;   Fair  Judgement:  Fair  Insight:  Fair  Psychomotor Activity:  Normal  Concentration:  Concentration: Fair and Attention Span: Fair  Recall:  [employer] of Knowledge: Fair  Language: Fair  Akathisia:  No  Handed:  Right  AIMS (if indicated): not done  Assets:  Communication Skills Desire for Improvement Housing Social Support  ADL's:  Intact  Cognition: WNL  Sleep:  Fair   Screenings: Midwife Visit from 11/19/2021 in [facility] Office Visit from 08/23/2021 in [facility] Office Visit from 05/17/2021 in [facility]  AIMS Total Score 0 0 0      GAD-7    Flowsheet Row Office Visit from 09/15/2023 in [facility] Office Visit from 01/08/2023 in [facility] Office Visit from 10/02/2022 in [facility] Office Visit from 04/01/2022 in [facility] Office Visit from 11/19/2021 in [facility]  Total GAD-7 Score 3 0 2 0 3      PHQ2-9    Flowsheet Row Office Visit from 09/15/2023 in [facility] Office Visit from 02/26/2023 in [facility] [place] Office Visit from 01/08/2023 in [facility] Office Visit from 10/02/2022 in [facility] Office Visit from 04/01/2022 in [facility]   PHQ-2 Total Score 1 0 0 0 0      Flowsheet Row Office Visit from 09/15/2023 in [facility] Office Visit from 07/09/2023 in [name] ED to Hosp-Admission (Discharged) from 03/24/2023 in [facility]  C-SSRS RISK CATEGORY Moderate Risk Moderate Risk No Risk        Assessment and Plan: Kyle Hood is a 68 year old male to male transgender, has a history of depression, anxiety was evaluated in office today.  Discussed assessment and plan as noted below.  Adjustment disorder with anxiety-improving Currently coping better with anxiety related to his treatment.  Denies any concerns at this time.  Uses hydroxyzine  as needed which has been beneficial. - Continue Hydroxyzine  25 mg 3 times a day as needed.  Depression in remission Denies any sadness or other symptoms of depression at this time.  Sleep is currently overall okay and rarely uses the trazodone . - Continue Trazodone  50 mg at bedtime as needed.  Social anxiety disorder/panic disorder-stable Currently denies any significant concerns related to social anxiety.  Denies any recent panic attacks. - Continue Hydroxyzine  25 mg 3 times a day as needed  Insomnia-improving Reports sleep is overall good. - Continue Trazodone  50 mg at bedtime as needed - Continue sleep hygiene techniques.   Follow-up Follow-up in clinic in 6 months or sooner if needed.   Collaboration of Care: Collaboration of Care: Patient refused AEB patient declined referral for psychotherapy.  Patient/Guardian was advised Release of Information must be obtained prior to any record release in order to collaborate their care with an outside provider. Patient/Guardian was advised if they have not already done so to contact the registration department to sign all necessary forms in order for us  to release information regarding their care.    Consent: Patient/Guardian gives verbal consent for treatment and assignment of benefits for services provided during this visit. Patient/Guardian expressed understanding and agreed to proceed.   This note was generated in part or whole with voice recognition software. Voice recognition is usually quite accurate but there are transcription errors that can and very often do occur. I apologize for any typographical errors that were not detected and corrected.    Alexxus Sobh, MD 09/15/2023, 12:56 PM

## 2024-01-29 ENCOUNTER — Ambulatory Visit (INDEPENDENT_AMBULATORY_CARE_PROVIDER_SITE_OTHER): Admitting: Podiatry

## 2024-01-29 DIAGNOSIS — M79674 Pain in right toe(s): Secondary | ICD-10-CM | POA: Diagnosis not present

## 2024-01-29 DIAGNOSIS — B351 Tinea unguium: Secondary | ICD-10-CM | POA: Diagnosis not present

## 2024-01-29 DIAGNOSIS — M79675 Pain in left toe(s): Secondary | ICD-10-CM | POA: Diagnosis not present

## 2024-01-29 NOTE — Progress Notes (Signed)
  Subjective:  Patient ID: Kyle Hood, adult    DOB: [birth year],  MRN: [id number]  Chief Complaint  Patient presents with   Nail Problem    Nail trim    68 y.o. adult returns for the above complaint.  Patient presents with thickened elongated dystrophic mycotic toenails x 10 mild pain on palpation arch with ambulation and shoe pressure has not seen anyone else prior to seeing me denies any other acute complaints would like to discuss treatment options for this pain scale 7 out of 10  Objective:  There were no vitals filed for this visit. Podiatric Exam: Vascular: dorsalis pedis and posterior tibial pulses are palpable bilateral. Capillary return is immediate. Temperature gradient is WNL. Skin turgor WNL  Sensorium: Normal Semmes Weinstein monofilament test. Normal tactile sensation bilaterally. Nail Exam: Pt has thick disfigured discolored nails with subungual debris noted bilateral entire nail hallux through fifth toenails.  Pain on palpation to the nails. Ulcer Exam: There is no evidence of ulcer or pre-ulcerative changes or infection. Orthopedic Exam: Muscle tone and strength are WNL. No limitations in general ROM. No crepitus or effusions noted.  Skin: No Porokeratosis. No infection or ulcers    Assessment & Plan:   1. Pain due to onychomycosis of toenails of both feet     Patient was evaluated and treated and all questions answered.  Onychomycosis with pain  -Nails palliatively debrided as below. -Educated on self-care  Procedure: Nail Debridement Rationale: pain  Type of Debridement: manual, sharp debridement. Instrumentation: Nail nipper, rotary burr. Number of Nails: 10  Procedures and Treatment: Consent by patient was obtained for treatment procedures. The patient understood the discussion of treatment and procedures well. All questions were answered thoroughly reviewed. Debridement of mycotic and hypertrophic toenails, 1 through 5 bilateral and clearing of  subungual debris. No ulceration, no infection noted.  Return Visit-Office Procedure: Patient instructed to return to the office for a follow up visit 3 months for continued evaluation and treatment.  Franky Blanch, DPM    Return in about 3 months (around 04/30/2024) for RFC .

## 2024-03-08 ENCOUNTER — Ambulatory Visit: Admitting: Psychiatry

## 2024-04-08 ENCOUNTER — Other Ambulatory Visit: Payer: Self-pay

## 2024-04-08 ENCOUNTER — Encounter: Payer: Self-pay | Admitting: Psychiatry

## 2024-04-08 ENCOUNTER — Ambulatory Visit (INDEPENDENT_AMBULATORY_CARE_PROVIDER_SITE_OTHER): Admitting: Psychiatry

## 2024-04-08 VITALS — BP 137/77 | HR 98 | Temp 97.2°F | Ht 67.0 in | Wt 276.2 lb

## 2024-04-08 DIAGNOSIS — F411 Generalized anxiety disorder: Secondary | ICD-10-CM | POA: Diagnosis not present

## 2024-04-08 DIAGNOSIS — F4010 Social phobia, unspecified: Secondary | ICD-10-CM

## 2024-04-08 DIAGNOSIS — F410 Panic disorder [episodic paroxysmal anxiety] without agoraphobia: Secondary | ICD-10-CM

## 2024-04-08 DIAGNOSIS — F3342 Major depressive disorder, recurrent, in full remission: Secondary | ICD-10-CM

## 2024-04-08 DIAGNOSIS — F5101 Primary insomnia: Secondary | ICD-10-CM

## 2024-04-08 MED ORDER — HYDROXYZINE HCL 25 MG PO TABS: 25.0000 mg | ORAL_TABLET | Freq: Three times a day (TID) | ORAL | 1 refills | Status: AC | PRN

## 2024-04-08 MED ORDER — LORAZEPAM 0.5 MG PO TABS: 0.2500 mg | ORAL_TABLET | ORAL | 0 refills | Status: AC

## 2024-04-08 MED ORDER — TRAZODONE HCL 50 MG PO TABS: 50.0000 mg | ORAL_TABLET | Freq: Every evening | ORAL | 1 refills | Status: AC | PRN

## 2024-04-08 NOTE — Progress Notes (Unsigned)
 [facility] MD OP Progress Note  04/08/2024 2:34 PM Kyle Hood  MRN:  [id number]  Chief Complaint:  Chief Complaint  Patient presents with   Follow-up   Anxiety   Medication Refill   Insomnia   Discussed the use of AI scribe software for clinical note transcription with the patient, who gave verbal consent to proceed.  History of Present Illness Kyle Hood is a 68 year old transgendered male to male, lives in [place] with partner, has a history of MDD, panic disorder, social anxiety disorder was evaluated in office today for a follow-up appointment.  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 Ongoing fears about his health, mortality, and the impact on his partner persist for about a year, and he notes that these worries intensify with his current physical symptoms and family history of cancer.  Exhaustion and frustration with frequent medical appointments contribute to his sense of feeling overwhelmed by the ongoing treatment process. He reports low energy, requires naps in the afternoon, and does not feel refreshed after sleep. Morning pain and stiffness make movement difficult, which he states further increases his anxiety and fatigue.  For anxiety, he uses hydroxyzine  as needed and requests a refill. He also reports intermittent use of lorazepam , which his oncologist previously prescribed, for episodes of heightened anxiety or panic. For sleep, he takes trazodone  and also uses melatonin 10 mg nightly.   He denies any thoughts of self-harm, suicide, or harm to others.   Visit Diagnosis:    ICD-10-CM   1. MDD (major depressive disorder), recurrent, in full remission  F33.42 hydrOXYzine  (ATARAX ) 25 MG tablet    2. Social anxiety disorder  F40.10 hydrOXYzine  (ATARAX ) 25 MG tablet    3. Panic disorder  F41.0 hydrOXYzine   (ATARAX ) 25 MG tablet    LORazepam  (ATIVAN ) 0.5 MG tablet    4. GAD (generalized anxiety disorder)  F41.1     5. Primary insomnia  F51.01 hydrOXYzine  (ATARAX ) 25 MG tablet    traZODone  (DESYREL ) 50 MG tablet      Past Psychiatric History: I have reviewed past psychiatric history from progress note on 06/05/2017.  Past trials of Zoloft, Wellbutrin   Past Medical History:  Past Medical History:  Diagnosis Date   Abnormal liver function test    Anxiety    Anxiety    Arthritis    Chronic lower back pain    Degenerative joint disease    Depression    Diabetes mellitus without complication (HCC)    Fatty liver    Heart murmur    Hepatitis B    Hyperlipidemia    Hypertension    Lumbar polyradiculopathy    Panic attacks    Polyneuropathy    Vertigo 05/03/2017   ER    Past Surgical History:  Procedure Laterality Date   COLONOSCOPY     COLONOSCOPY WITH PROPOFOL  N/A 10/01/2017   Procedure: COLONOSCOPY WITH PROPOFOL ;  Surgeon: Toledo, Ladell POUR, MD;  Location: [facility] ENDOSCOPY;  Service: Endoscopy;  Laterality: N/A;   IR BONE MARROW BIOPSY & ASPIRATION  03/25/2023   PROSTATE BIOPSY      Family Psychiatric History: I have reviewed family psychiatric history from progress note on 06/05/2017.  Family History:  Family History  Problem Relation Age of Onset   Cancer Mother        Pancreatic   Diabetes Mother    Hypertension Mother  Varicose Veins Mother    Alcohol abuse Mother    Pancreatic cancer Mother    Cancer Father        Jaw/ oral   Heart disease Father        CABG   Aortic aneurysm Father    Congestive Heart Failure Father    Hypertension Father    Varicose Veins Father    Alcohol abuse Father    Bipolar disorder Sister    Bipolar disorder Brother     Social History: I have reviewed social history from progress note on 06/05/2017. Social History   Socioeconomic History   Marital status: Single    Spouse name: Kyle Hood   Number of children: 0   Years of  education: Not on file   Highest education level: 11th grade  Occupational History    Comment: disabled  Tobacco Use   Smoking status: Never   Smokeless tobacco: Never  Vaping Use   Vaping status: Never Used  Substance and Sexual Activity   Alcohol use: No   Drug use: Not Currently    Types: Marijuana   Sexual activity: Not Currently  Other Topics Concern   Not on file  Social History Narrative   Not on file   Social Drivers of Health   Tobacco Use: Low Risk (04/08/2024)   Patient History    Smoking Tobacco Use: Never    Smokeless Tobacco Use: Never    Passive Exposure: Not on file  Financial Resource Strain: Low Risk (04/17/2023)   Received from [facility]   Overall Financial Resource Strain (CARDIA)    Difficulty of Paying Living Expenses: Not very hard  Food Insecurity: No Food Insecurity (04/17/2023)   [facility]   Epic    Within the past 12 months, you worried that your food would run out before you got the money to buy more.: Never true    Within the past 12 months, the food you bought just didn't last and you didn't have money to get more.: Never true  Transportation Needs: No Transportation Needs (04/29/2023)   Received from [facility]   PRAPARE - Transportation    Lack of Transportation (Medical): No    Lack of Transportation (Non-Medical): No  Physical Activity: Not on file  Stress: Not on file  Social Connections: Not on file  Depression (PHQ2-9): Low Risk (04/08/2024)   Depression (PHQ2-9)    PHQ-2 Score: 0  Alcohol Screen: Not on file  Housing: Medium Risk (03/25/2023)   Housing    Last Housing Risk Score: 1  Utilities: Low Risk (04/17/2023)   Received from [facility]   Utilities    Within the past 12 months, have you been unable to get utilities(heat, electricity) when it was really needed?: No  Health Literacy: Low Risk (04/17/2023)   Received from [facility] Literacy    How often do you need to  have someone help you when you read instructions, pamphlets, or other written material from your doctor or pharmacy?: Never    Allergies: Allergies[1]  Metabolic Disorder Labs: Lab Results  Component Value Date   HGBA1C 7.3 (H) 03/24/2023   MPG 162.81 03/24/2023   No results found for: PROLACTIN Lab Results  Component Value Date   CHOL 145 08/16/2014   TRIG 320 (A) 08/16/2014   HDL 36 08/16/2014   LDLCALC 45 08/16/2014   Lab Results  Component Value Date   TSH 2.794 02/26/2023  TSH 1.61 08/16/2014    Therapeutic Level Labs: No results found for: LITHIUM No results found for: VALPROATE No results found for: CBMZ  Current Medications: Current Outpatient Medications  Medication Sig Dispense Refill   LORazepam  (ATIVAN ) 0.5 MG tablet Take 0.5-1 tablets (0.25-0.5 mg total) by mouth as directed. Take 0.25 mg - 0.5 mg daily as needed for severe panic attacks only .Must last 30 days. 12 tablet 0   Continuous Glucose Sensor (FREESTYLE LIBRE 3 SENSOR) MISC USE 1 SENSOR EVERY 14 DAYS     dapsone 100 MG tablet Take 100 mg by mouth daily.     dexamethasone  (DECADRON ) 4 MG tablet Take 10 tablets (40 mg total) by mouth daily. 40 tablet 0   feeding supplement (ENSURE ENLIVE / ENSURE PLUS) LIQD Take 237 mLs by mouth 2 (two) times daily between meals.     furosemide (LASIX) 20 MG tablet Take by mouth.     hydrOXYzine  (ATARAX ) 25 MG tablet Take 1 tablet (25 mg total) by mouth every 8 (eight) hours as needed for anxiety. 90 tablet 1   magnesium  hydroxide (MILK OF MAGNESIA) 400 MG/5ML suspension Take 5 mLs by mouth daily as needed for mild constipation.     melatonin 3 MG TABS tablet Take 3-6 mg by mouth.     polyethylene glycol (MIRALAX  / GLYCOLAX ) 17 g packet Take 17 g by mouth daily.     potassium chloride (KLOR-CON M) 10 MEQ tablet Take 10 mEq by mouth 2 (two) times daily.     pravastatin  (PRAVACHOL ) 20 MG tablet Take 20 mg by mouth daily.     tamsulosin  (FLOMAX ) 0.4 MG CAPS  capsule TAKE 1 CAPSULE(0.4 MG) BY MOUTH DAILY 90 capsule 3   TRADJENTA 5 MG TABS tablet Take 5 mg by mouth daily.     traZODone  (DESYREL ) 50 MG tablet Take 1 tablet (50 mg total) by mouth at bedtime as needed for sleep. Stop Quetiapine  90 tablet 1   valACYclovir (VALTREX) 500 MG tablet Take 500 mg by mouth 2 (two) times daily.     No current facility-administered medications for this visit.     Musculoskeletal: Strength & Muscle Tone: within normal limits Gait & Station: normal Patient leans: N/A  Psychiatric Specialty Exam: Review of Systems  Psychiatric/Behavioral:  Positive for sleep disturbance. The patient is nervous/anxious.     Blood pressure 137/77, pulse 98, temperature (!) 97.2 F (36.2 C), temperature source Temporal, height 5' 7 (1.702 m), weight 276 lb 3.2 oz (125.3 kg).Body mass index is 43.26 kg/m.  General Appearance: Fairly Groomed  Eye Contact:  Fair  Speech:  Clear and Coherent  Volume:  Normal  Mood:  Anxious  Affect:  Congruent  Thought Process:  Goal Directed and Descriptions of Associations: Intact  Orientation:  Full (Time, Place, and Person)  Thought Content: Logical   Suicidal Thoughts:  No  Homicidal Thoughts:  No  Memory:  Immediate;   Fair Recent;   Fair Remote;   Fair  Judgement:  Fair  Insight:  Fair  Psychomotor Activity:  Normal  Concentration:  Concentration: Fair and Attention Span: Fair  Recall:  [employer] of Knowledge: Fair  Language: Fair  Akathisia:  No  Handed:  Right  AIMS (if indicated): not done  Assets:  Communication Skills Desire for Improvement Housing Social Support Transportation  ADL's:  Intact  Cognition: WNL  Sleep:  Poor   Screenings: AIMS    Flowsheet Row Office Visit from 11/19/2021 in [facility] Office Visit from 08/23/2021 in [facility] Office Visit from 05/17/2021 in [facility]   AIMS Total Score 0 0 0   GAD-7    Flowsheet Row Office Visit from 04/08/2024 in [facility] Office Visit from 09/15/2023 in [facility] Office Visit from 01/08/2023 in [facility] Office Visit from 10/02/2022 in [facility] Office Visit from 04/01/2022 in [facility]  Total GAD-7 Score 0 3 0 2 0   PHQ2-9    Flowsheet Row Office Visit from 04/08/2024 in [facility] Office Visit from 09/15/2023 in [facility] Office Visit from 02/26/2023 in [facility] [place] Office Visit from 01/08/2023 in [facility] Office Visit from 10/02/2022 in [facility]  PHQ-2 Total Score 0 1 0 0 0   Flowsheet Row Office Visit from 04/08/2024 in [facility] Office Visit from 09/15/2023 in [facility] Office Visit from 07/09/2023 in [facility]  C-SSRS RISK CATEGORY No Risk Moderate Risk Moderate Risk     Assessment and Plan: Daxx Tiggs is a 68 year old male to male transgender, presented for a follow-up appointment, discussed assessment and plan as noted below.  1. MDD (major depressive disorder), recurrent, in full remission Currently denies any significant depression symptoms. Will monitor closely.  2. Social anxiety disorder-stable Currently denies any significant social anxiety symptoms Will monitor closely  3. Panic disorder-stable Although panic symptoms are manageable does report intermittent anxiety symptoms mostly situational, ongoing chemotherapy for leukemia and worries  about the future. Continue Hydroxyzine  25 mg 3 times a day as needed Start lorazepam  0.25-0.5 mg as needed for severe anxiety attacks only. Reviewed [place] PMP AWARxE   4. GAD (generalized anxiety disorder)-unstable Ongoing worry about current health issues, ongoing chemotherapy.  Discussed to establish care with therapist.  Patient to think about it and let this provider know. Start Lorazepam  0.25-0.5 mg as needed for severe anxiety attacks only Continue hydroxyzine  as noted above. Not interested in initiation of an SSRI or SNRI at this time. Referred for CBT-patient to establish care.  5. Primary insomnia-unstable Ongoing sleep problems mostly due to pain at this time. Encouraged to start using Trazodone  every night. Continue Trazodone  50 mg at bedtime as needed Continue sleep hygiene techniques Will consider increasing the dose of trazodone  in the future if needed  Follow-up Follow-up in clinic in 2 months or sooner if needed.    Collaboration of Care: Collaboration of Care: Referral or follow-up with counselor/therapist AEB patient encouraged to establish care with therapist.  Patient/Guardian was advised Release of Information must be obtained prior to any record release in order to collaborate their care with an outside provider. Patient/Guardian was advised if they have not already done so to contact the registration department to sign all necessary forms in order for us  to release information regarding their care.   Consent: Patient/Guardian gives verbal consent for treatment and assignment of benefits for services provided during this visit. Patient/Guardian expressed understanding and agreed to proceed.   This note was generated in part or whole with voice recognition software. Voice recognition is usually quite accurate but there are transcription errors that can  and very often do occur. I apologize for any typographical errors that were not detected and  corrected.    Jayin Derousse, MD 04/08/2024, 2:34 PM     [1]  Allergies Allergen Reactions   Lisinopril Cough

## 2024-04-14 ENCOUNTER — Telehealth: Payer: Self-pay | Admitting: Psychiatry

## 2024-04-14 NOTE — Telephone Encounter (Signed)
 A prescription was sent out on the day of his visit.

## 2024-04-14 NOTE — Telephone Encounter (Signed)
 Patient states he would like a prescription for hydroxine sent to [facility] on [address]. Stated it was  mentioned it in the appointment that a prescription would be sent.

## 2024-04-19 ENCOUNTER — Telehealth: Payer: Self-pay

## 2024-04-19 NOTE — Telephone Encounter (Signed)
 Pt left voicemail pharmacy needed a PA for   hydrOXYzine  (ATARAX ) 25 MG tablet  Spoke with pharmacist who is sending a PA Via FAX.

## 2024-06-08 ENCOUNTER — Ambulatory Visit: Admitting: Psychiatry

## 2024-06-10 ENCOUNTER — Ambulatory Visit: Admitting: Psychiatry
